# Patient Record
Sex: Male | Born: 1950 | Race: Black or African American | Hispanic: No | Marital: Single | Smoking: Never smoker
Health system: Southern US, Community
[De-identification: ages and names within clinical notes are randomized; demographics above are authoritative.]

## PROBLEM LIST (undated history)

## (undated) DIAGNOSIS — K219 Gastro-esophageal reflux disease without esophagitis: Secondary | ICD-10-CM

## (undated) DIAGNOSIS — K5900 Constipation, unspecified: Secondary | ICD-10-CM

## (undated) DIAGNOSIS — Z789 Other specified health status: Secondary | ICD-10-CM

## (undated) DIAGNOSIS — L309 Dermatitis, unspecified: Secondary | ICD-10-CM

## (undated) DIAGNOSIS — E785 Hyperlipidemia, unspecified: Secondary | ICD-10-CM

## (undated) DIAGNOSIS — J069 Acute upper respiratory infection, unspecified: Secondary | ICD-10-CM

## (undated) DIAGNOSIS — M1990 Unspecified osteoarthritis, unspecified site: Secondary | ICD-10-CM

## (undated) DIAGNOSIS — T7840XA Allergy, unspecified, initial encounter: Secondary | ICD-10-CM

## (undated) DIAGNOSIS — M25559 Pain in unspecified hip: Secondary | ICD-10-CM

## (undated) DIAGNOSIS — E119 Type 2 diabetes mellitus without complications: Secondary | ICD-10-CM

## (undated) DIAGNOSIS — J449 Chronic obstructive pulmonary disease, unspecified: Secondary | ICD-10-CM

## (undated) DIAGNOSIS — J45998 Other asthma: Secondary | ICD-10-CM

## (undated) DIAGNOSIS — I10 Essential (primary) hypertension: Secondary | ICD-10-CM

## (undated) DIAGNOSIS — K08109 Complete loss of teeth, unspecified cause, unspecified class: Secondary | ICD-10-CM

## (undated) HISTORY — DX: Essential (primary) hypertension: I10

## (undated) HISTORY — DX: Acute upper respiratory infection, unspecified: J06.9

## (undated) HISTORY — DX: Hyperlipidemia, unspecified: E78.5

## (undated) HISTORY — PX: CIRCUMCISION: SUR203

## (undated) HISTORY — DX: Type 2 diabetes mellitus without complications: E11.9

## (undated) HISTORY — DX: Dermatitis, unspecified: L30.9

## (undated) HISTORY — DX: Constipation, unspecified: K59.00

## (undated) HISTORY — DX: Allergy, unspecified, initial encounter: T78.40XA

## (undated) HISTORY — DX: Unspecified osteoarthritis, unspecified site: M19.90

## (undated) HISTORY — DX: Gastro-esophageal reflux disease without esophagitis: K21.9

## (undated) HISTORY — DX: Chronic obstructive pulmonary disease, unspecified: J44.9

---

## 2013-02-02 ENCOUNTER — Encounter (HOSPITAL_COMMUNITY): Payer: Self-pay | Admitting: Pharmacy Technician

## 2013-02-04 ENCOUNTER — Encounter (HOSPITAL_COMMUNITY)
Admission: RE | Admit: 2013-02-04 | Discharge: 2013-02-04 | Disposition: A | Discharge status: Home / Self Care | Payer: BC Managed Care – PPO | Source: Ambulatory Visit | Attending: Ophthalmology | Admitting: Ophthalmology

## 2013-02-04 ENCOUNTER — Encounter (HOSPITAL_COMMUNITY): Payer: Self-pay

## 2013-02-04 DIAGNOSIS — Z01818 Encounter for other preprocedural examination: Secondary | ICD-10-CM | POA: Insufficient documentation

## 2013-02-04 DIAGNOSIS — Z01812 Encounter for preprocedural laboratory examination: Secondary | ICD-10-CM | POA: Insufficient documentation

## 2013-02-04 DIAGNOSIS — Z01810 Encounter for preprocedural cardiovascular examination: Secondary | ICD-10-CM | POA: Insufficient documentation

## 2013-02-04 HISTORY — DX: Pain in unspecified hip: M25.559

## 2013-02-04 HISTORY — DX: Other asthma: J45.998

## 2013-02-04 HISTORY — DX: Other specified health status: Z78.9

## 2013-02-04 HISTORY — DX: Complete loss of teeth, unspecified cause, unspecified class: K08.109

## 2013-02-04 LAB — BASIC METABOLIC PANEL
CO2: 29 mEq/L (ref 19–32)
Chloride: 106 mEq/L (ref 96–112)
Glucose, Bld: 164 mg/dL — ABNORMAL HIGH (ref 70–99)
Potassium: 4 mEq/L (ref 3.5–5.1)
Sodium: 141 mEq/L (ref 135–145)

## 2013-02-04 LAB — HEMOGLOBIN AND HEMATOCRIT, BLOOD
HCT: 42 % (ref 39.0–52.0)
Hemoglobin: 14.2 g/dL (ref 13.0–17.0)

## 2013-02-04 NOTE — Patient Instructions (Addendum)
Your procedure is scheduled on:  Thursday, 02/17/13   Report to [facility] at    0730    AM.  Call this number if you have problems the morning of surgery: [phone]   Remember:   Do not eat or drink   After Midnight.  Take these medicines the morning of surgery with A SIP OF WATER:    Do not wear jewelry, make-up or nail polish.  Do not wear lotions, powders, or perfumes. You may wear deodorant.  Do not bring valuables to the hospital.  Contacts, dentures or bridgework may not be worn into surgery.     Patients discharged the day of surgery will not be allowed to drive home.  Name and phone number of your driver: driver  Special Instructions: Use eye drops as directed.   Please read over the following fact sheets that you were given: Pain Booklet, Anesthesia Post-op Instructions and Care and Recovery After Surgery    Cataract Surgery  A cataract is a clouding of the lens of the eye. When a lens becomes cloudy, vision is reduced based on the degree and nature of the clouding. Surgery may be needed to improve vision. Surgery removes the cloudy lens and usually replaces it with a substitute lens (intraocular lens, IOL). LET YOUR EYE DOCTOR KNOW ABOUT:  Allergies to food or medicine.   Medicines taken including herbs, eyedrops, over-the-counter medicines, and creams.   Use of steroids (by mouth or creams).   Previous problems with anesthetics or numbing medicine.   History of bleeding problems or blood clots.   Previous surgery.   Other health problems, including diabetes and kidney problems.   Possibility of pregnancy, if this applies.  RISKS AND COMPLICATIONS  Infection.   Inflammation of the eyeball (endophthalmitis) that can spread to both eyes (sympathetic ophthalmia).   Poor wound healing.   If an IOL is inserted, it can later fall out of proper position. This is very uncommon.   Clouding of the part of your eye that holds an IOL in place. This is called an  "after-cataract." These are uncommon, but easily treated.  BEFORE THE PROCEDURE  Do not eat or drink anything except small amounts of water for 8 to 12 before your surgery, or as directed by your caregiver.   Unless you are told otherwise, continue any eyedrops you have been prescribed.   Talk to your primary caregiver about all other medicines that you take (both prescription and non-prescription). In some cases, you may need to stop or change medicines near the time of your surgery. This is most important if you are taking blood-thinning medicine.Do not stop medicines unless you are told to do so.   Arrange for someone to drive you to and from the procedure.   Do not put contact lenses in either eye on the day of your surgery.  PROCEDURE There is more than one method for safely removing a cataract. Your doctor can explain the differences and help determine which is best for you. Phacoemulsification surgery is the most common form of cataract surgery.  An injection is given behind the eye or eyedrops are given to make this a painless procedure.   A small cut (incision) is made on the edge of the clear, dome-shaped surface that covers the front of the eye (cornea).   A tiny probe is painlessly inserted into the eye. This device gives off ultrasound waves that soften and break up the cloudy center of the lens. This makes  it easier for the cloudy lens to be removed by suction.   An IOL may be implanted.   The normal lens of the eye is covered by a clear capsule. Part of that capsule is intentionally left in the eye to support the IOL.   Your surgeon may or may not use stitches to close the incision.  There are other forms of cataract surgery that require a larger incision and stiches to close the eye. This approach is taken in cases where the doctor feels that the cataract cannot be easily removed using phacoemulsification. AFTER THE PROCEDURE  When an IOL is implanted, it does not need  care. It becomes a permanent part of your eye and cannot be seen or felt.   Your doctor will schedule follow-up exams to check on your progress.   Review your other medicines with your doctor to see which can be resumed after surgery.   Use eyedrops or take medicine as prescribed by your doctor.  Document Released: 04/24/2011 Document Reviewed: 04/21/2011 [facility] Patient Information 2012 [place], [name].  PATIENT INSTRUCTIONS POST-ANESTHESIA  IMMEDIATELY FOLLOWING SURGERY:  Do not drive or operate machinery for the first twenty four hours after surgery.  Do not make any important decisions for twenty four hours after surgery or while taking narcotic pain medications or sedatives.  If you develop intractable nausea and vomiting or a severe headache please notify your doctor immediately.  FOLLOW-UP:  Please make an appointment with your surgeon as instructed. You do not need to follow up with anesthesia unless specifically instructed to do so.  WOUND CARE INSTRUCTIONS (if applicable):  Keep a dry clean dressing on the anesthesia/puncture wound site if there is drainage.  Once the wound has quit draining you may leave it open to air.  Generally you should leave the bandage intact for twenty four hours unless there is drainage.  If the epidural site drains for more than 36-48 hours please call the anesthesia department.  QUESTIONS?:  Please feel free to call your physician or the hospital operator if you have any questions, and they will be happy to assist you.

## 2013-02-16 MED ORDER — LIDOCAINE HCL (PF) 1 % IJ SOLN
INTRAMUSCULAR | Status: AC
  Filled 2013-02-16: qty 2

## 2013-02-16 MED ORDER — LIDOCAINE HCL 3.5 % OP GEL
OPHTHALMIC | Status: AC
  Filled 2013-02-16: qty 1

## 2013-02-16 MED ORDER — NEOMYCIN-POLYMYXIN-DEXAMETH 3.5-10000-0.1 OP SUSP
OPHTHALMIC | Status: AC
  Filled 2013-02-16: qty 5

## 2013-02-16 MED ORDER — CYCLOPENTOLATE-PHENYLEPHRINE OP SOLN OPTIME - NO CHARGE
OPHTHALMIC | Status: AC
  Filled 2013-02-16: qty 2

## 2013-02-16 MED ORDER — PHENYLEPHRINE HCL 2.5 % OP SOLN
OPHTHALMIC | Status: AC
  Filled 2013-02-16: qty 15

## 2013-02-16 MED ORDER — TETRACAINE HCL 0.5 % OP SOLN
OPHTHALMIC | Status: AC
  Filled 2013-02-16: qty 2

## 2013-02-17 ENCOUNTER — Ambulatory Visit (HOSPITAL_COMMUNITY)
Admission: RE | Admit: 2013-02-17 | Discharge: 2013-02-17 | Disposition: A | Discharge status: Home / Self Care | Payer: BC Managed Care – PPO | Source: Ambulatory Visit | Attending: Ophthalmology | Admitting: Ophthalmology

## 2013-02-17 ENCOUNTER — Encounter (HOSPITAL_COMMUNITY)
Admission: RE | Disposition: A | Discharge status: Home / Self Care | Payer: Self-pay | Source: Ambulatory Visit | Attending: Ophthalmology

## 2013-02-17 ENCOUNTER — Ambulatory Visit (HOSPITAL_COMMUNITY): Payer: BC Managed Care – PPO | Admitting: Anesthesiology

## 2013-02-17 ENCOUNTER — Encounter (HOSPITAL_COMMUNITY): Payer: Self-pay | Admitting: *Deleted

## 2013-02-17 ENCOUNTER — Encounter (HOSPITAL_COMMUNITY): Payer: Self-pay | Admitting: Anesthesiology

## 2013-02-17 DIAGNOSIS — Z01812 Encounter for preprocedural laboratory examination: Secondary | ICD-10-CM | POA: Insufficient documentation

## 2013-02-17 DIAGNOSIS — R7309 Other abnormal glucose: Secondary | ICD-10-CM | POA: Insufficient documentation

## 2013-02-17 DIAGNOSIS — H2510 Age-related nuclear cataract, unspecified eye: Secondary | ICD-10-CM | POA: Insufficient documentation

## 2013-02-17 HISTORY — PX: CATARACT EXTRACTION W/PHACO: SHX586

## 2013-02-17 LAB — GLUCOSE, CAPILLARY: Glucose-Capillary: 101 mg/dL — ABNORMAL HIGH (ref 70–99)

## 2013-02-17 SURGERY — PHACOEMULSIFICATION, CATARACT, WITH IOL INSERTION
Anesthesia: Monitor Anesthesia Care | Site: Eye | Laterality: Right | Wound class: Clean

## 2013-02-17 MED ORDER — NEOMYCIN-POLYMYXIN-DEXAMETH 0.1 % OP OINT
TOPICAL_OINTMENT | OPHTHALMIC | Status: DC | PRN
  Administered 2013-02-17: 1 via OPHTHALMIC

## 2013-02-17 MED ORDER — EPINEPHRINE HCL 1 MG/ML IJ SOLN
INTRAOCULAR | Status: DC | PRN
  Administered 2013-02-17: 09:00:00

## 2013-02-17 MED ORDER — CYCLOPENTOLATE-PHENYLEPHRINE 0.2-1 % OP SOLN
1.0000 [drp] | OPHTHALMIC | Status: AC
  Administered 2013-02-17 (×3): 1 [drp] via OPHTHALMIC

## 2013-02-17 MED ORDER — POVIDONE-IODINE 5 % OP SOLN
OPHTHALMIC | Status: DC | PRN
  Administered 2013-02-17: 1 via OPHTHALMIC

## 2013-02-17 MED ORDER — MIDAZOLAM HCL 2 MG/2ML IJ SOLN
INTRAMUSCULAR | Status: AC
  Filled 2013-02-17: qty 2

## 2013-02-17 MED ORDER — PHENYLEPHRINE HCL 2.5 % OP SOLN
1.0000 [drp] | OPHTHALMIC | Status: AC
  Administered 2013-02-17 (×3): 1 [drp] via OPHTHALMIC

## 2013-02-17 MED ORDER — LIDOCAINE HCL (PF) 1 % IJ SOLN
INTRAMUSCULAR | Status: DC | PRN
  Administered 2013-02-17: .2 mL

## 2013-02-17 MED ORDER — BSS IO SOLN
INTRAOCULAR | Status: DC | PRN
  Administered 2013-02-17: 15 mL via INTRAOCULAR

## 2013-02-17 MED ORDER — LIDOCAINE 3.5 % OP GEL OPTIME - NO CHARGE
OPHTHALMIC | Status: DC | PRN
  Administered 2013-02-17: 1 [drp] via OPHTHALMIC

## 2013-02-17 MED ORDER — TETRACAINE HCL 0.5 % OP SOLN
1.0000 [drp] | OPHTHALMIC | Status: AC
  Administered 2013-02-17 (×3): 1 [drp] via OPHTHALMIC

## 2013-02-17 MED ORDER — LIDOCAINE HCL 3.5 % OP GEL
1.0000 "application " | Freq: Once | OPHTHALMIC | Status: AC
  Administered 2013-02-17: 1 via OPHTHALMIC

## 2013-02-17 MED ORDER — LACTATED RINGERS IV SOLN
INTRAVENOUS | Status: DC
  Administered 2013-02-17: 1000 mL via INTRAVENOUS

## 2013-02-17 MED ORDER — PROVISC 10 MG/ML IO SOLN
INTRAOCULAR | Status: DC | PRN
  Administered 2013-02-17: 8.5 mg via INTRAOCULAR

## 2013-02-17 MED ORDER — EPINEPHRINE HCL 1 MG/ML IJ SOLN
INTRAMUSCULAR | Status: AC
  Filled 2013-02-17: qty 1

## 2013-02-17 MED ORDER — MIDAZOLAM HCL 2 MG/2ML IJ SOLN
1.0000 mg | INTRAMUSCULAR | Status: DC | PRN
  Administered 2013-02-17: 2 mg via INTRAVENOUS

## 2013-02-17 SURGICAL SUPPLY — 32 items
CAPSULAR TENSION RING-AMO (OPHTHALMIC RELATED) IMPLANT
CLOTH BEACON ORANGE TIMEOUT ST (SAFETY) ×2 IMPLANT
EYE SHIELD UNIVERSAL CLEAR (GAUZE/BANDAGES/DRESSINGS) ×2 IMPLANT
GLOVE BIO SURGEON STRL SZ 6.5 (GLOVE) IMPLANT
GLOVE BIOGEL PI IND STRL 6.5 (GLOVE) ×1 IMPLANT
GLOVE BIOGEL PI IND STRL 7.0 (GLOVE) IMPLANT
GLOVE BIOGEL PI IND STRL 7.5 (GLOVE) IMPLANT
GLOVE BIOGEL PI INDICATOR 6.5 (GLOVE) ×1
GLOVE BIOGEL PI INDICATOR 7.0 (GLOVE)
GLOVE BIOGEL PI INDICATOR 7.5 (GLOVE)
GLOVE ECLIPSE 6.5 STRL STRAW (GLOVE) IMPLANT
GLOVE ECLIPSE 7.0 STRL STRAW (GLOVE) IMPLANT
GLOVE ECLIPSE 7.5 STRL STRAW (GLOVE) IMPLANT
GLOVE EXAM NITRILE LRG STRL (GLOVE) ×2 IMPLANT
GLOVE EXAM NITRILE MD LF STRL (GLOVE) IMPLANT
GLOVE SKINSENSE NS SZ6.5 (GLOVE)
GLOVE SKINSENSE NS SZ7.0 (GLOVE)
GLOVE SKINSENSE STRL SZ6.5 (GLOVE) IMPLANT
GLOVE SKINSENSE STRL SZ7.0 (GLOVE) IMPLANT
KIT VITRECTOMY (OPHTHALMIC RELATED) IMPLANT
PAD ARMBOARD 7.5X6 YLW CONV (MISCELLANEOUS) ×2 IMPLANT
PROC W NO LENS (INTRAOCULAR LENS)
PROC W SPEC LENS (INTRAOCULAR LENS)
PROCESS W NO LENS (INTRAOCULAR LENS) IMPLANT
PROCESS W SPEC LENS (INTRAOCULAR LENS) IMPLANT
RING MALYGIN (MISCELLANEOUS) IMPLANT
SIGHTPATH CAT PROC W REG LENS (Ophthalmic Related) ×2 IMPLANT
SYR TB 1ML LL NO SAFETY (SYRINGE) ×2 IMPLANT
TAPE SURG TRANSPORE 1 IN (GAUZE/BANDAGES/DRESSINGS) ×1 IMPLANT
TAPE SURGICAL TRANSPORE 1 IN (GAUZE/BANDAGES/DRESSINGS) ×1
VISCOELASTIC ADDITIONAL (OPHTHALMIC RELATED) IMPLANT
WATER STERILE IRR 250ML POUR (IV SOLUTION) ×2 IMPLANT

## 2013-02-17 NOTE — H&P (Signed)
I have reviewed the H&P, the patient was re-examined, and I have identified no interval changes in medical condition and plan of care since the history and physical of record  

## 2013-02-17 NOTE — Transfer of Care (Signed)
Immediate Anesthesia Transfer of Care Note  Patient: Nicholas Burns  Procedure(s) Performed: Procedure(s) with comments: CATARACT EXTRACTION PHACO AND INTRAOCULAR LENS PLACEMENT (IOC) (Right) - CDE:11.32  Patient Location: PACU and Short Stay  Anesthesia Type:MAC  Level of Consciousness: awake  Airway & Oxygen Therapy: Patient Spontanous Breathing  Post-op Assessment: Report given to PACU RN  Post vital signs: Reviewed  Complications: No apparent anesthesia complications

## 2013-02-17 NOTE — Anesthesia Postprocedure Evaluation (Signed)
  Anesthesia Post-op Note  Patient: Nicholas Burns  Procedure(s) Performed: Procedure(s) with comments: CATARACT EXTRACTION PHACO AND INTRAOCULAR LENS PLACEMENT (IOC) (Right) - CDE:11.32  Patient Location: Short Stay  Anesthesia Type:MAC  Level of Consciousness: awake, alert  and oriented  Airway and Oxygen Therapy: Patient Spontanous Breathing  Post-op Pain: none  Post-op Assessment: Post-op Vital signs reviewed, Patient's Cardiovascular Status Stable, Respiratory Function Stable, Patent Airway and No signs of Nausea or vomiting  Post-op Vital Signs: Reviewed and stable  Complications: No apparent anesthesia complications

## 2013-02-17 NOTE — Anesthesia Preprocedure Evaluation (Signed)
Anesthesia Evaluation  Patient identified by MRN, date of birth, ID band Patient awake  General Assessment Comment:Poor historian   Reviewed: Allergy & Precautions, H&P , NPO status , Patient's Chart, lab work & pertinent test results, reviewed documented beta blocker date and time   Airway Mallampati: III TM Distance: >3 FB   Mouth opening: Limited Mouth Opening  Dental  (+) Poor Dentition and Missing   Pulmonary asthma ,  breath sounds clear to auscultation        Cardiovascular negative cardio ROS  Rhythm:Regular Rate:Normal     Neuro/Psych    GI/Hepatic negative GI ROS,   Endo/Other    Renal/GU      Musculoskeletal   Abdominal   Peds  Hematology   Anesthesia Other Findings   Reproductive/Obstetrics                           Anesthesia Physical Anesthesia Plan  ASA: II  Anesthesia Plan: MAC   Post-op Pain Management:    Induction: Intravenous  Airway Management Planned: Nasal Cannula  Additional Equipment:   Intra-op Plan:   Post-operative Plan:   Informed Consent: I have reviewed the patients History and Physical, chart, labs and discussed the procedure including the risks, benefits and alternatives for the proposed anesthesia with the patient or authorized representative who has indicated his/her understanding and acceptance.     Plan Discussed with:   Anesthesia Plan Comments:         Anesthesia Quick Evaluation

## 2013-02-17 NOTE — Op Note (Signed)
Date of Admission: 02/17/2013  Date of Surgery: 02/17/2013  Pre-Op Dx: Cataract  Right  Eye  Post-Op Dx: Nuclear Cataract  Right  Eye,  Dx Code 366.16  Surgeon: Gemma Payor, M.D.  Assistants: None  Anesthesia: Topical with MAC  Indications: Painless, progressive loss of vision with compromise of daily activities.  Surgery: Cataract Extraction with Intraocular lens Implant Right Eye  Discription: The patient had dilating drops and viscous lidocaine placed into the right eye in the pre-op holding area. After transfer to the operating room, a time out was performed. The patient was then prepped and draped. Beginning with a 75 degree blade a paracentesis port was made at the surgeon's 2 o'clock position. The anterior chamber was then filled with 1% non-preserved lidocaine. This was followed by filling the anterior chamber with Provisc.  A 2.15mm keratome blade was used to make a clear corneal incision at the temporal limbus.  A bent cystatome needle was used to create a continuous tear capsulotomy. Hydrodissection was performed with balanced salt solution on a Fine canula. The lens nucleus was then removed using the phacoemulsification handpiece. Residual cortex was removed with the I&A handpiece. The anterior chamber and capsular bag were refilled with Provisc. A posterior chamber intraocular lens was placed into the capsular bag with it's injector. The implant was positioned with the Kuglan hook. The Provisc was then removed from the anterior chamber and capsular bag with the I&A handpiece. Stromal hydration of the main incision and paracentesis port was performed with BSS on a Fine canula. The wounds were tested for leak which was negative. The patient tolerated the procedure well. There were no operative complications. The patient was then transferred to the recovery room in stable condition.  Complications: None  Specimen: None  EBL: None  Prosthetic device: B&L enVista, MX60, power 19.0D, SN  [id number].

## 2013-02-18 ENCOUNTER — Encounter (HOSPITAL_COMMUNITY): Payer: Self-pay | Admitting: Ophthalmology

## 2013-03-26 ENCOUNTER — Emergency Department (HOSPITAL_COMMUNITY)
Admission: EM | Admit: 2013-03-26 | Discharge: 2013-03-26 | Disposition: A | Discharge status: Home / Self Care | Payer: BC Managed Care – PPO | Attending: Emergency Medicine | Admitting: Emergency Medicine

## 2013-03-26 ENCOUNTER — Encounter (HOSPITAL_COMMUNITY): Payer: Self-pay | Admitting: Emergency Medicine

## 2013-03-26 DIAGNOSIS — H409 Unspecified glaucoma: Secondary | ICD-10-CM

## 2013-03-26 DIAGNOSIS — J45909 Unspecified asthma, uncomplicated: Secondary | ICD-10-CM | POA: Insufficient documentation

## 2013-03-26 MED ORDER — TIMOLOL MALEATE 0.5 % OP SOLN
1.0000 [drp] | Freq: Once | OPHTHALMIC | Status: AC
  Administered 2013-03-26: 1 [drp] via OPHTHALMIC
  Filled 2013-03-26: qty 5

## 2013-03-26 MED ORDER — PILOCARPINE HCL 2 % OP SOLN: 1.0000 [drp] | Freq: Four times a day (QID) | OPHTHALMIC | Status: DC

## 2013-03-26 MED ORDER — PILOCARPINE HCL 2 % OP SOLN
1.0000 [drp] | Freq: Once | OPHTHALMIC | Status: AC
  Administered 2013-03-26: 1 [drp] via OPHTHALMIC
  Filled 2013-03-26: qty 15

## 2013-03-26 MED ORDER — PREDNISOLONE ACETATE 1 % OP SUSP: 1.0000 [drp] | Freq: Four times a day (QID) | OPHTHALMIC | Status: DC

## 2013-03-26 MED ORDER — ACETAZOLAMIDE 250 MG PO TABS
500.0000 mg | ORAL_TABLET | Freq: Once | ORAL | Status: AC
  Administered 2013-03-26: 500 mg via ORAL
  Filled 2013-03-26: qty 2

## 2013-03-26 MED ORDER — TIMOLOL MALEATE 0.5 % OP SOLN: 1.0000 [drp] | Freq: Two times a day (BID) | OPHTHALMIC | Status: DC

## 2013-03-26 MED ORDER — TETRACAINE HCL 0.5 % OP SOLN
2.0000 [drp] | Freq: Once | OPHTHALMIC | Status: AC
  Administered 2013-03-26: 2 [drp] via OPHTHALMIC
  Filled 2013-03-26: qty 2

## 2013-03-26 MED ORDER — OXYCODONE-ACETAMINOPHEN 5-325 MG PO TABS
1.0000 | ORAL_TABLET | Freq: Once | ORAL | Status: AC
  Administered 2013-03-26: 1 via ORAL
  Filled 2013-03-26: qty 1

## 2013-03-26 MED ORDER — APRACLONIDINE HCL 1 % OP SOLN
1.0000 [drp] | Freq: Once | OPHTHALMIC | Status: AC
  Administered 2013-03-26: 1 [drp] via OPHTHALMIC
  Filled 2013-03-26: qty 0.1

## 2013-03-26 MED ORDER — ACETAZOLAMIDE 250 MG PO TABS: 250.0000 mg | ORAL_TABLET | Freq: Three times a day (TID) | ORAL | Status: DC

## 2013-03-26 NOTE — ED Notes (Signed)
At work at [employer]; started having sensitivity to light and headache suddenly with right eye pain and watering and wouldn't close. 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 No history of htn or use of meds for htn. Has had cough/cold symptoms since yesterday. Currently reports no headache now, just light sensitivity. Has history of cataract surgery.

## 2013-03-26 NOTE — ED Provider Notes (Signed)
CSN: [id number]     Arrival date & time 03/26/13  1557 History   First MD Initiated Contact with Patient 03/26/13 1559     Chief Complaint  Patient presents with  . Hypertension   (Consider location/radiation/quality/duration/timing/severity/associated sxs/prior Treatment) HPI  This is a 62 year old male who presents with acute onset of right eye pain, headache, and photosensitivity. Patient was at work when he describes having pretty rapid onset of right eye pain. He develops photosensitivity and his eyes began to water. Patient also reports a headache.  He has no history of glaucoma but did have a recent cataract surgery.  Patient was noted to have high blood pressure when it was checked at work. EMS was called. Patient denies any worst HA of his life or maximal headache at onset.    Past Medical History  Diagnosis Date  . Seasonal asthma   . Poor historian   . Hip pain, acute     left  . Teeth missing    Past Surgical History  Procedure Laterality Date  . Circumcision  age 39    [facility]  . Cataract extraction w/phaco Right 02/17/2013    Procedure: CATARACT EXTRACTION PHACO AND INTRAOCULAR LENS PLACEMENT (IOC);  Surgeon: Gemma Payor, MD;  Location: [facility];  Service: Ophthalmology;  Laterality: Right;  CDE:11.32   History reviewed. No pertinent family history. History  Substance Use Topics  . Smoking status: Never Smoker   . Smokeless tobacco: Not on file  . Alcohol Use: No    Review of Systems  Constitutional: Negative for fever.  Eyes: Positive for photophobia, pain, redness and visual disturbance.  Respiratory: Negative for chest tightness and shortness of breath.   Neurological: Positive for headaches. Negative for weakness.  All other systems reviewed and are negative.    Allergies  Review of patient's allergies indicates no known allergies.  Home Medications   Current Outpatient Rx  Name  Route  Sig  Dispense  Refill  . acetaZOLAMIDE (DIAMOX) 250 MG tablet    Oral   Take 1 tablet (250 mg total) by mouth 3 (three) times daily.   12 tablet   0   . pilocarpine (PILOCAR) 2 % ophthalmic solution   Right Eye   Place 1 drop into the right eye 4 (four) times daily.   15 mL   12   . prednisoLONE acetate (PRED FORTE) 1 % ophthalmic suspension   Right Eye   Place 1 drop into the right eye 4 (four) times daily.   5 mL   0   . timolol (TIMOPTIC) 0.5 % ophthalmic solution   Right Eye   Place 1 drop into the right eye 2 (two) times daily.   10 mL   12    BP 150/96  Pulse 73  Temp(Src) 100 F (37.8 C) (Oral)  Resp 19  Ht 5\' 9"  (1.753 m)  Wt 230 lb (104.327 kg)  BMI 33.95 kg/m2  SpO2 97% Physical Exam  Nursing note and vitals reviewed. Constitutional: He is oriented to person, place, and time. He appears well-developed and well-nourished.  Uncomfortable appearing, eyes closed  HENT:  Head: Normocephalic and atraumatic.  Mouth/Throat: Oropharynx is clear and moist.  Eyes:  Right eye: Chemosis, pupil 4 mm and minimally reactive, extraocular movements intact left eye: Pupil 5 mm and reactive, no chemosis  Neck: Neck supple.  Cardiovascular: Normal rate, regular rhythm and normal heart sounds.   No murmur heard. Pulmonary/Chest: Effort normal and breath sounds normal. No respiratory distress.  Abdominal: Soft. There is no tenderness.  Musculoskeletal: He exhibits no edema.  Neurological: He is alert and oriented to person, place, and time.  Skin: Skin is warm and dry.  Psychiatric: He has a normal mood and affect.    ED Course  Procedures (including critical care time) Labs Review Labs Reviewed - No data to display Imaging Review No results found.  EKG Interpretation   None       MDM   1. Glaucoma    Patient presentation and PE concerning for acute glaucoma.  Pressure 21 which is just upper limits of normal to slightly elevated.  However, exam is very suggestive.  Will treat.    Repeat pressure after treatment is 6.   Patient initially continued to complain of eye pain and sensitivity.  Fluoroscein exam does not show any evidence of corneal abnormality or abrasion.   Discussed patient with MD on call for Dr. Alto Denver.  They agree with treatment and follow-up on Monday.    Reexam after treatment:  OP of 6, pupil more reactive, no evidence of chemosis.  VIsion 20/30.  Pain improved.  Patient will be discharged home with medications and close Optho follow-up.  GIven strict return precautions.  After history, exam, and medical workup I feel the patient has been appropriately medically screened and is safe for discharge home. Pertinent diagnoses were discussed with the patient. Patient was given return precautions.      Shon Baton, MD 03/26/13 2103

## 2013-03-26 NOTE — ED Notes (Signed)
Dr. Wilkie Aye at bedside for assessment

## 2013-03-26 NOTE — ED Notes (Signed)
Pt comfortable with d/c and f/u instructions. Prescriptions x4

## 2013-03-26 NOTE — ED Notes (Signed)
Tonopen at bedside.  

## 2013-09-30 ENCOUNTER — Ambulatory Visit (INDEPENDENT_AMBULATORY_CARE_PROVIDER_SITE_OTHER): Payer: BC Managed Care – PPO | Admitting: Physician Assistant

## 2013-09-30 ENCOUNTER — Encounter: Payer: Self-pay | Admitting: Physician Assistant

## 2013-09-30 VITALS — BP 146/92 | HR 51 | Temp 97.7°F | Ht 66.5 in | Wt 231.0 lb

## 2013-09-30 DIAGNOSIS — Z0000 Encounter for general adult medical examination without abnormal findings: Secondary | ICD-10-CM

## 2013-09-30 DIAGNOSIS — J45909 Unspecified asthma, uncomplicated: Secondary | ICD-10-CM

## 2013-09-30 DIAGNOSIS — K089 Disorder of teeth and supporting structures, unspecified: Secondary | ICD-10-CM

## 2013-09-30 MED ORDER — ALBUTEROL SULFATE (2.5 MG/3ML) 0.083% IN NEBU: 2.5000 mg | INHALATION_SOLUTION | Freq: Four times a day (QID) | RESPIRATORY_TRACT | Status: DC | PRN

## 2013-09-30 NOTE — Progress Notes (Signed)
Subjective:     Patient ID: Nicholas Burns, male   DOB: April 04, 1951, 63 y.o.   MRN: [id number]  HPI Pt here for CPE He was told by his place of work he needed one for ins purposes Pt only with  hx of intermit LBP and seasonal asthma He uses OTC Primatene Mist for sx  Review of Systems  Constitutional: Negative.   HENT: Positive for dental problem.   Eyes: Negative.   Respiratory: Positive for wheezing.   Cardiovascular: Negative.   Gastrointestinal: Negative.   Endocrine: Negative.   Genitourinary:       Nocturia x 2  Musculoskeletal: Positive for back pain.  Skin: Negative.   Allergic/Immunologic: Positive for environmental allergies.  Neurological: Negative.   Hematological: Negative.   Psychiatric/Behavioral: Negative.        Objective:   Physical Exam  Constitutional: He is oriented to person, place, and time. He appears well-developed and well-nourished.  HENT:  Head: Normocephalic and atraumatic.  Right Ear: External ear normal.  Left Ear: External ear normal.  Nose: Nose normal.  Mouth/Throat: Oropharynx is clear and moist.  Multiple missing teeth but no current caries seen  Eyes: Conjunctivae and EOM are normal. Pupils are equal, round, and reactive to light.  Neck: Normal range of motion. Neck supple. No JVD present. No thyromegaly present.  Cardiovascular: Normal rate, regular rhythm, normal heart sounds and intact distal pulses.   Pulmonary/Chest: Effort normal. He has wheezes.  Abdominal: Soft. Bowel sounds are normal. He exhibits no distension and no mass. There is no tenderness.  Genitourinary: Penis normal.  Musculoskeletal: Normal range of motion. He exhibits no edema and no tenderness.  Lymphadenopathy:    He has no cervical adenopathy.  Neurological: He is alert and oriented to person, place, and time. He has normal reflexes. No cranial nerve deficit.  Skin: Skin is warm and dry.  Psychiatric: He has a normal mood and affect. His behavior is normal.        Assessment:     CPE    Plan:     Pt keeps regular appt with dentist and has one in the near future Pt up to date with eye exam Discussed BP was elevated today but improved after the exam I would like him to f/u and have regular BP check with the nurse at [facility] Alb rx done today and pt to stop use of Primatene Full labs pending and will inform of the lab results Begin walking program and decrease soda intake Pt to f/u pending labs Pt also to sign med release form so we can get notes form prev provider

## 2013-09-30 NOTE — Patient Instructions (Signed)

## 2013-10-01 ENCOUNTER — Ambulatory Visit: Payer: BC Managed Care – PPO

## 2013-10-01 ENCOUNTER — Other Ambulatory Visit: Payer: Self-pay | Admitting: Nurse Practitioner

## 2013-10-01 DIAGNOSIS — J45909 Unspecified asthma, uncomplicated: Secondary | ICD-10-CM

## 2013-10-01 LAB — CMP14+EGFR
A/G RATIO: 1.4 (ref 1.1–2.5)
ALK PHOS: 60 IU/L (ref 39–117)
ALT: 22 IU/L (ref 0–44)
AST: 26 IU/L (ref 0–40)
Albumin: 4.4 g/dL (ref 3.6–4.8)
BILIRUBIN TOTAL: 0.6 mg/dL (ref 0.0–1.2)
BUN / CREAT RATIO: 12 (ref 10–22)
BUN: 13 mg/dL (ref 8–27)
CHLORIDE: 100 mmol/L (ref 97–108)
CO2: 25 mmol/L (ref 18–29)
Calcium: 9.4 mg/dL (ref 8.6–10.2)
Creatinine, Ser: 1.09 mg/dL (ref 0.76–1.27)
GFR, EST AFRICAN AMERICAN: 84 mL/min/{1.73_m2} (ref >59)
GFR, EST NON AFRICAN AMERICAN: 72 mL/min/{1.73_m2} (ref >59)
Globulin, Total: 3.2 g/dL (ref 1.5–4.5)
Glucose: 104 mg/dL — ABNORMAL HIGH (ref 65–99)
POTASSIUM: 4.1 mmol/L (ref 3.5–5.2)
Sodium: 141 mmol/L (ref 134–144)
TOTAL PROTEIN: 7.6 g/dL (ref 6.0–8.5)

## 2013-10-01 LAB — LIPID PANEL
CHOLESTEROL TOTAL: 148 mg/dL (ref 100–199)
Chol/HDL Ratio: 3.6 ratio units (ref 0.0–5.0)
HDL: 41 mg/dL (ref >39)
LDL CALC: 95 mg/dL (ref 0–99)
Triglycerides: 59 mg/dL (ref 0–149)
VLDL CHOLESTEROL CAL: 12 mg/dL (ref 5–40)

## 2013-10-01 LAB — PSA, TOTAL AND FREE
PSA FREE PCT: 27 %
PSA FREE: 0.62 ng/mL
PSA: 2.3 ng/mL (ref 0.0–4.0)

## 2013-10-03 ENCOUNTER — Telehealth: Payer: Self-pay | Admitting: Family Medicine

## 2013-10-03 NOTE — Telephone Encounter (Signed)
Message copied by Waverly Ferrari on Mon Oct 03, 2013  4:23 PM ------      Message from: Lodema Pilot      Created: Mon Oct 03, 2013  2:58 PM       Labs better than 8 months ago      Sugar was sl elevated so re enforce minimize soda intake      Monitor BP      Keep regular f/u ------

## 2013-10-06 NOTE — Telephone Encounter (Signed)
Message copied by Cline Crock on Thu Oct 06, 2013 12:26 PM ------      Message from: Lodema Pilot      Created: Mon Oct 03, 2013  2:58 PM       Labs better than 8 months ago      Sugar was sl elevated so re enforce minimize soda intake      Monitor BP      Keep regular f/u ------

## 2013-10-15 ENCOUNTER — Encounter: Payer: Self-pay | Admitting: *Deleted

## 2013-11-02 ENCOUNTER — Encounter: Payer: Self-pay | Admitting: Physician Assistant

## 2013-11-02 ENCOUNTER — Ambulatory Visit (INDEPENDENT_AMBULATORY_CARE_PROVIDER_SITE_OTHER): Payer: BC Managed Care – PPO | Admitting: Physician Assistant

## 2013-11-02 ENCOUNTER — Telehealth: Payer: Self-pay | Admitting: *Deleted

## 2013-11-02 VITALS — BP 142/96 | HR 61 | Temp 98.2°F | Ht 66.5 in | Wt 234.2 lb

## 2013-11-02 DIAGNOSIS — J45909 Unspecified asthma, uncomplicated: Secondary | ICD-10-CM

## 2013-11-02 MED ORDER — FLUTICASONE PROPIONATE HFA 110 MCG/ACT IN AERO: 1.0000 | INHALATION_SPRAY | Freq: Two times a day (BID) | RESPIRATORY_TRACT | Status: DC

## 2013-11-02 NOTE — Progress Notes (Signed)
Subjective:     Patient ID: Nicholas Burns, male   DOB: 12-12-1950, 63 y.o.   MRN: [id number]  HPI Pt here for recheck of asthma He started the Alb inhaler and it has helped Using ~ 3 times per week  Review of Systems + wheeze Denis fever, chills, congestion + dry cough    Objective:   Physical Exam Oral- Clear PND No cerv nodes Heart- RRR w/o M Lungs- exp wheeze bilat    Assessment:    Asthma    Plan:     Due to continued wheeze will add Flovent Pt informed how to use and rinse mouth after use Continue with Alb for rescue F/U in 1 month

## 2013-11-02 NOTE — Patient Instructions (Signed)
Asthma Asthma is a recurring condition in which the airways tighten and narrow. Asthma can make it difficult to breathe. It can cause coughing, wheezing, and shortness of breath. Asthma episodes, also called asthma attacks, range from minor to life-threatening. Asthma cannot be cured, but medicines and lifestyle changes can help control it. CAUSES Asthma is believed to be caused by inherited (genetic) and environmental factors, but its exact cause is unknown. Asthma may be triggered by allergens, lung infections, or irritants in the air. Asthma triggers are different for each person. Common triggers include:   Animal dander.  Dust mites.  Cockroaches.  Pollen from trees or grass.  Mold.  Smoke.  Air pollutants such as dust, household cleaners, hair sprays, aerosol sprays, paint fumes, strong chemicals, or strong odors.  Cold air, weather changes, and winds (which increase molds and pollens in the air).  Strong emotional expressions such as crying or laughing hard.  Stress.  Certain medicines (such as aspirin) or types of drugs (such as beta-blockers).  Sulfites in foods and drinks. Foods and drinks that may contain sulfites include dried fruit, potato chips, and sparkling grape juice.  Infections or inflammatory conditions such as the flu, a cold, or an inflammation of the nasal membranes (rhinitis).  Gastroesophageal reflux disease (GERD).  Exercise or strenuous activity. SYMPTOMS Symptoms may occur immediately after asthma is triggered or many hours later. Symptoms include:  Wheezing.  Excessive nighttime or early morning coughing.  Frequent or severe coughing with a common cold.  Chest tightness.  Shortness of breath. DIAGNOSIS  The diagnosis of asthma is made by a review of your medical history and a physical exam. Tests may also be performed. These may include:  Lung function studies. These tests show how much air you breathe in and out.  Allergy  tests.  Imaging tests such as X-rays. TREATMENT  Asthma cannot be cured, but it can usually be controlled. Treatment involves identifying and avoiding your asthma triggers. It also involves medicines. There are 2 classes of medicine used for asthma treatment:   Controller medicines. These prevent asthma symptoms from occurring. They are usually taken every day.  Reliever or rescue medicines. These quickly relieve asthma symptoms. They are used as needed and provide short-term relief. Your health care Amair Shrout will help you create an asthma action plan. An asthma action plan is a written plan for managing and treating your asthma attacks. It includes a list of your asthma triggers and how they may be avoided. It also includes information on when medicines should be taken and when their dosage should be changed. An action plan may also involve the use of a device called a peak flow meter. A peak flow meter measures how well the lungs are working. It helps you monitor your condition. HOME CARE INSTRUCTIONS   Take medicine as directed by your health care Malan Werk. Speak with your health care Anely Spiewak if you have questions about how or when to take the medicines.  Use a peak flow meter as directed by your health care Maeson Purohit. Record and keep track of readings.  Understand and use the action plan to help minimize or stop an asthma attack without needing to seek medical care.  Control your home environment in the following ways to help prevent asthma attacks:  Do not smoke. Avoid being exposed to secondhand smoke.  Change your heating and air conditioning filter regularly.  Limit your use of fireplaces and wood stoves.  Get rid of pests (such as roaches and mice)  and their droppings.  Throw away plants if you see mold on them.  Clean your floors and dust regularly. Use unscented cleaning products.  Try to have someone else vacuum for you regularly. Stay out of rooms while they are being  vacuumed and for a short while afterward. If you vacuum, use a dust mask from a hardware store, a double-layered or microfilter vacuum cleaner bag, or a vacuum cleaner with a HEPA filter.  Replace carpet with wood, tile, or vinyl flooring. Carpet can trap dander and dust.  Use allergy-proof pillows, mattress covers, and box spring covers.  Wash bed sheets and blankets every week in hot water and dry them in a dryer.  Use blankets that are made of polyester or cotton.  Clean bathrooms and kitchens with bleach. If possible, have someone repaint the walls in these rooms with mold-resistant paint. Keep out of the rooms that are being cleaned and painted.  Wash hands frequently. SEEK MEDICAL CARE IF:   You have wheezing, shortness of breath, or a cough even if taking medicine to prevent attacks.  The colored mucus you cough up (sputum) is thicker than usual.  Your sputum changes from clear or white to yellow, green, gray, or bloody.  You have any problems that may be related to the medicines you are taking (such as a rash, itching, swelling, or trouble breathing).  You are using a reliever medicine more than 2-3 times per week.  Your peak flow is still at 50-79% of your personal best after following your action plan for 1 hour. SEEK IMMEDIATE MEDICAL CARE IF:   You seem to be getting worse and are unresponsive to treatment during an asthma attack.  You are short of breath even at rest.  You get short of breath when doing very little physical activity.  You have difficulty eating, drinking, or talking due to asthma symptoms.  You develop chest pain.  You develop a fast heartbeat.  You have a bluish color to your lips or fingernails.  You are lightheaded, dizzy, or faint.  Your peak flow is less than 50% of your personal best.  You have a fever or persistent symptoms for more than 2-3 days.  You have a fever and symptoms suddenly get worse. MAKE SURE YOU:   Understand  these instructions.  Will watch your condition.  Will get help right away if you are not doing well or get worse. Document Released: 05/05/2005 Document Revised: 05/10/2013 Document Reviewed: 12/02/2012 [facility] Patient Information 2015 [place], [place]. This information is not intended to replace advice given to you by your health care Jemario Poitras. Make sure you discuss any questions you have with your health care Blyss Lugar.

## 2013-11-02 NOTE — Telephone Encounter (Signed)
Patient's insurance won't cover Flovent so [facility] changed script to Pulmicort, use twice daily.

## 2015-07-20 ENCOUNTER — Ambulatory Visit (INDEPENDENT_AMBULATORY_CARE_PROVIDER_SITE_OTHER): Payer: BLUE CROSS/BLUE SHIELD | Admitting: Pediatrics

## 2015-07-20 VITALS — BP 175/104 | HR 75 | Temp 97.7°F | Wt 239.0 lb

## 2015-07-20 DIAGNOSIS — J4541 Moderate persistent asthma with (acute) exacerbation: Secondary | ICD-10-CM

## 2015-07-20 MED ORDER — METHYLPREDNISOLONE ACETATE 80 MG/ML IJ SUSP
80.0000 mg | Freq: Once | INTRAMUSCULAR | Status: AC
  Administered 2015-07-20: 80 mg via INTRAMUSCULAR

## 2015-07-20 MED ORDER — ALBUTEROL SULFATE (2.5 MG/3ML) 0.083% IN NEBU: 2.5000 mg | INHALATION_SOLUTION | Freq: Four times a day (QID) | RESPIRATORY_TRACT | Status: DC | PRN

## 2015-07-20 MED ORDER — PREDNISONE 20 MG PO TABS: ORAL_TABLET | ORAL | Status: DC

## 2015-07-20 NOTE — Patient Instructions (Addendum)
Take albuterol four times a day Take steroids, two pills once a day for 5 days Take blood pressure medicine every night

## 2015-07-20 NOTE — Progress Notes (Signed)
Subjective:    Patient ID: Nicholas Burns, male    DOB: 06/29/1950, 65 y.o.   MRN: [id number]  CC: Asthma and Cough   HPI: Nicholas Burns is a 65 y.o. male presenting for Asthma and Cough  Wheezing started about a week ago Took albuterol last night, taking just once day  About a month has been taking albuterol nightly Has been seeing a doctor in other clinic, been started on BP meds, not taking regularly, last over a week ago No fevers, no runny nose No headaches or vision changes Takes his BP meds irregularly, last time was over a week ago Is not sure what meds he is on  Relevant past medical, surgical, family and social history reviewed and updated as indicated. Interim medical history since our last visit reviewed. Allergies and medications reviewed and updated.    ROS: Per HPI unless specifically indicated above  History  Smoking status  . Never Smoker   Smokeless tobacco  . Not on file    Past Medical History Patient Active Problem List   Diagnosis Date Noted  . Unspecified asthma(493.90) 09/30/2013  . Poor dentition 09/30/2013    Current Outpatient Prescriptions  Medication Sig Dispense Refill  . albuterol (PROVENTIL) (2.5 MG/3ML) 0.083% nebulizer solution Take 3 mLs (2.5 mg total) by nebulization every 6 (six) hours as needed for wheezing or shortness of breath. 150 mL 1  . fluticasone (FLOVENT HFA) 110 MCG/ACT inhaler Inhale 1 puff into the lungs 2 (two) times daily. (Patient not taking: Reported on 07/20/2015) 1 Inhaler 12  . predniSONE (DELTASONE) 20 MG tablet 2 po at same time daily for 5 days 10 tablet 0   No current facility-administered medications for this visit.       Objective:    BP 175/104 mmHg  Pulse 75  Temp(Src) 97.7 F (36.5 C) (Oral)  Wt 239 lb (108.41 kg)  SpO2 96%  Wt Readings from Last 3 Encounters:  07/20/15 239 lb (108.41 kg)  11/02/13 234 lb 3.2 oz (106.232 kg)  09/30/13 231 lb (104.781 kg)     Gen: NAD, alert, cooperative  with exam, NCAT EYES: EOMI, no scleral injection or icterus ENT:  TMs pearly gray b/l, OP without erythema LYMPH: no cervical LAD CV: NRRR, normal S1/S2, no murmur, distal pulses 2+ b/l Resp: moving air fair, diffuse inspiratory and expiratory wheezes, speaking in complete sentences, normal WOB Abd: +BS, soft, NTND. Ext: No edema, warm Neuro: Alert and oriented, strength equal b/l UE and LE, coordination grossly normal MSK: normal muscle bulk     Assessment & Plan:    Nicholas Burns was seen today for asthma exacerbation, also with elevated BP, asymptomatic. Gave shot of depomedrol, start prednisone, take albuterol more often at home. RTC in 1 week, need to start controller asthma med. Also needs to bring back BP meds to clinic so we can see what he is taking. Take BP meds at home until seen back in clinic. Return precautions given. Says he wants to follow up here.  Diagnoses and all orders for this visit:  Asthma, moderate persistent, with acute exacerbation -     albuterol (PROVENTIL) (2.5 MG/3ML) 0.083% nebulizer solution; Take 3 mLs (2.5 mg total) by nebulization every 6 (six) hours as needed for wheezing or shortness of breath. -     predniSONE (DELTASONE) 20 MG tablet; 2 po at same time daily for 5 days -     methylPREDNISolone acetate (DEPO-MEDROL) injection 80 mg; Inject 1 mL (80 mg  total) into the muscle once.  Essential HTN Uncontrolled, as above, take home meds, bring bottles to clinic next week  Follow up plan: Return in about 6 days (around 07/26/2015) for recheck med, BP, asthma.  Assunta Found, MD [facility] 07/20/2015, 6:57 PM

## 2015-07-26 ENCOUNTER — Encounter: Payer: Self-pay | Admitting: Pediatrics

## 2015-07-26 ENCOUNTER — Ambulatory Visit (INDEPENDENT_AMBULATORY_CARE_PROVIDER_SITE_OTHER): Payer: BLUE CROSS/BLUE SHIELD | Admitting: Pediatrics

## 2015-07-26 VITALS — BP 151/90 | HR 64 | Temp 97.8°F | Ht 66.5 in | Wt 239.8 lb

## 2015-07-26 DIAGNOSIS — J4541 Moderate persistent asthma with (acute) exacerbation: Secondary | ICD-10-CM

## 2015-07-26 DIAGNOSIS — I10 Essential (primary) hypertension: Secondary | ICD-10-CM

## 2015-07-26 DIAGNOSIS — J309 Allergic rhinitis, unspecified: Secondary | ICD-10-CM | POA: Diagnosis not present

## 2015-07-26 MED ORDER — ALBUTEROL SULFATE (2.5 MG/3ML) 0.083% IN NEBU: 2.5000 mg | INHALATION_SOLUTION | Freq: Four times a day (QID) | RESPIRATORY_TRACT | Status: DC | PRN

## 2015-07-26 MED ORDER — CETIRIZINE HCL 10 MG PO TABS: 10.0000 mg | ORAL_TABLET | Freq: Every day | ORAL | Status: DC

## 2015-07-26 MED ORDER — FLUTICASONE-SALMETEROL 250-50 MCG/DOSE IN AEPB: 1.0000 | INHALATION_SPRAY | Freq: Two times a day (BID) | RESPIRATORY_TRACT | Status: DC

## 2015-07-26 MED ORDER — METHYLPREDNISOLONE ACETATE 80 MG/ML IJ SUSP
80.0000 mg | Freq: Once | INTRAMUSCULAR | Status: AC
  Administered 2015-07-26: 80 mg via INTRAMUSCULAR

## 2015-07-26 NOTE — Progress Notes (Signed)
Subjective:    Patient ID: Nicholas Burns, male    DOB: Mar 23, 1951, 65 y.o.   MRN: [id number]  CC: Blood Pressure Check and Medication Check   HPI: Nicholas Burns is a 65 y.o. male presenting for Blood Pressure Check and Medication Check  He thinks breathing is better He has not picked up prednisone yet He got a steroid injection at last office visit 6 days ago He is taking albuterol once at night as he was before Lives with two smokers, they often smoke inside the house Cigarette smoke and cooking smells tend to make breathing worse Spring time tends to make allergies worse and hot weather He does have animals at home Has never been tested for allergies Has been around cigarette smoke all of his life  No headaches, vision changes, dizziness. Thinks he is on lisinopril at home for BP though he is not sure  Depression screen [facility] 2/9 07/26/2015  Decreased Interest 0  Down, Depressed, Hopeless 0  PHQ - 2 Score 0     Relevant past medical, surgical, family and social history reviewed and updated as indicated. Interim medical history since our last visit reviewed. Allergies and medications reviewed and updated.    ROS: Per HPI unless specifically indicated above  History  Smoking status  . Never Smoker   Smokeless tobacco  . Not on file    Past Medical History Patient Active Problem List   Diagnosis Date Noted  . Unspecified asthma(493.90) 09/30/2013  . Poor dentition 09/30/2013       Objective:    BP 151/90 mmHg  Pulse 64  Temp(Src) 97.8 F (36.6 C) (Oral)  Ht 5' 6.5" (1.689 m)  Wt 239 lb 12.8 oz (108.773 kg)  BMI 38.13 kg/m2  Wt Readings from Last 3 Encounters:  07/26/15 239 lb 12.8 oz (108.773 kg)  07/20/15 239 lb (108.41 kg)  11/02/13 234 lb 3.2 oz (106.232 kg)     Gen: NAD, alert, cooperative with exam, NCAT EYES: EOMI, no scleral injection or icterus ENT:  TMs pearly gray b/l, OP without erythema LYMPH: no cervical LAD CV: NRRR, normal S1/S2,  no murmur, distal pulses 2+ b/l Resp: moving air fair, diffuse inspiratory and expiratory wheezing b/l, comfortable WOB.  Abd: +BS, soft, NTND. no guarding or organomegaly Ext: No edema, warm Neuro: Alert and oriented, strength equal b/l UE and LE, coordination grossly normal    Assessment & Plan:    Nicholas Burns was seen today for blood pressure check and medication check.  Diagnoses and all orders for this visit:  Asthma, moderate persistent, with acute exacerbation Has not yet picked up his prednisone. Says he will be unable to for another two weeks. Start controller medicine as below. Increase albuteorl frequency while wheezing. Discussed avoiding triggers. -     albuterol (PROVENTIL) (2.5 MG/3ML) 0.083% nebulizer solution; Take 3 mLs (2.5 mg total) by nebulization every 6 (six) hours as needed for wheezing or shortness of breath. -     Fluticasone-Salmeterol (ADVAIR DISKUS) 250-50 MCG/DOSE AEPB; Inhale 1 puff into the lungs 2 (two) times daily. -     methylPREDNISolone acetate (DEPO-MEDROL) injection 80 mg; Inject 1 mL (80 mg total) into the muscle once.  Essential hypertension Elevated today. Thinks he is on lisinopril at home, doesn't know the dose. Will call us back with dose and will send into pharmacy.  Allergic rhinitis, unspecified allergic rhinitis type -     cetirizine (ZYRTEC) 10 MG tablet; Take 1 tablet (10 mg total) by  mouth daily.   Follow up plan: Return in about 3 weeks (around 08/16/2015).  Assunta Found, MD [facility] 07/26/2015, 5:44 PM

## 2015-07-26 NOTE — Patient Instructions (Signed)
Call us at [phone] to let me know know what dose of lisinopril you need sent to the pharmacy.  Advair (purple disc) one puff twice a day, rinse mouth out after using advair with water.  Albuterol twice a day

## 2015-08-16 ENCOUNTER — Ambulatory Visit: Payer: BLUE CROSS/BLUE SHIELD | Admitting: Pediatrics

## 2015-08-17 ENCOUNTER — Encounter: Payer: Self-pay | Admitting: Pediatrics

## 2015-08-21 ENCOUNTER — Ambulatory Visit: Payer: BLUE CROSS/BLUE SHIELD | Admitting: Family Medicine

## 2015-09-13 ENCOUNTER — Ambulatory Visit (INDEPENDENT_AMBULATORY_CARE_PROVIDER_SITE_OTHER): Payer: BLUE CROSS/BLUE SHIELD | Admitting: Family Medicine

## 2015-09-13 ENCOUNTER — Encounter: Payer: Self-pay | Admitting: Family Medicine

## 2015-09-13 VITALS — BP 171/97 | HR 73 | Temp 97.1°F | Ht 66.5 in | Wt 241.8 lb

## 2015-09-13 DIAGNOSIS — I10 Essential (primary) hypertension: Secondary | ICD-10-CM

## 2015-09-13 DIAGNOSIS — J4530 Mild persistent asthma, uncomplicated: Secondary | ICD-10-CM | POA: Diagnosis not present

## 2015-09-13 MED ORDER — HYDROCHLOROTHIAZIDE 25 MG PO TABS: 25.0000 mg | ORAL_TABLET | Freq: Every day | ORAL | Status: DC

## 2015-09-13 NOTE — Progress Notes (Signed)
   Subjective:    Patient ID: Nicholas Burns, male    DOB: 1950-12-17, 65 y.o.   MRN: [id number]  HPI 65 year old gentleman here to follow-up his asthma. He says that prednisone may him jittery and he did not tolerate it well but he is unclear whether it was really the prednisone or the cetirizine that he also took. Today I also note that his blood pressure is elevated as it was at the previous visit pressure today is 171/97. In general he says that his asthma is better than it used to be but he still uses his albuterol inhaler nebulization frequently.  Patient Active Problem List   Diagnosis Date Noted  . Unspecified asthma(493.90) 09/30/2013  . Poor dentition 09/30/2013   Outpatient Encounter Prescriptions as of 09/13/2015  Medication Sig  . albuterol (PROVENTIL) (2.5 MG/3ML) 0.083% nebulizer solution Take 3 mLs (2.5 mg total) by nebulization every 6 (six) hours as needed for wheezing or shortness of breath.  . cetirizine (ZYRTEC) 10 MG tablet Take 1 tablet (10 mg total) by mouth daily.  . fluticasone (FLOVENT HFA) 110 MCG/ACT inhaler Inhale 1 puff into the lungs 2 (two) times daily.  . Fluticasone-Salmeterol (ADVAIR DISKUS) 250-50 MCG/DOSE AEPB Inhale 1 puff into the lungs 2 (two) times daily.  . [DISCONTINUED] predniSONE (DELTASONE) 20 MG tablet 2 po at same time daily for 5 days   No facility-administered encounter medications on file as of 09/13/2015.      Review of Systems  Constitutional: Negative.   HENT: Negative.   Respiratory: Positive for wheezing.   Cardiovascular: Negative.   Neurological: Negative.   Psychiatric/Behavioral: Negative.        Objective:   Physical Exam  Constitutional: He is oriented to person, place, and time. He appears well-developed and well-nourished.  Cardiovascular: Normal rate, regular rhythm and normal heart sounds.   Pulmonary/Chest: Effort normal and breath sounds normal.  Neurological: He is alert and oriented to person, place, and time.            Assessment & Plan:  1. Asthma, mild persistent, uncomplicated All of [place]. I believe with use of albuterol inhaler he would benefit from daily inhaled steroid. Two-week sample and then he is to let us know how he feels.  2. Essential hypertension H CTZ 25 mg daily. Recheck in 3 months  Wardell Honour MD

## 2015-11-14 ENCOUNTER — Encounter: Payer: Self-pay | Admitting: Physician Assistant

## 2015-11-14 ENCOUNTER — Ambulatory Visit (INDEPENDENT_AMBULATORY_CARE_PROVIDER_SITE_OTHER): Payer: BLUE CROSS/BLUE SHIELD | Admitting: Physician Assistant

## 2015-11-14 VITALS — BP 159/99 | HR 89 | Temp 99.6°F | Ht 66.5 in | Wt 250.4 lb

## 2015-11-14 DIAGNOSIS — S70911A Unspecified superficial injury of right hip, initial encounter: Secondary | ICD-10-CM | POA: Diagnosis not present

## 2015-11-14 DIAGNOSIS — J4530 Mild persistent asthma, uncomplicated: Secondary | ICD-10-CM | POA: Diagnosis not present

## 2015-11-14 DIAGNOSIS — J4541 Moderate persistent asthma with (acute) exacerbation: Secondary | ICD-10-CM | POA: Diagnosis not present

## 2015-11-14 DIAGNOSIS — W57XXXA Bitten or stung by nonvenomous insect and other nonvenomous arthropods, initial encounter: Secondary | ICD-10-CM | POA: Diagnosis not present

## 2015-11-14 MED ORDER — FLUTICASONE-SALMETEROL 250-50 MCG/DOSE IN AEPB: 1.0000 | INHALATION_SPRAY | Freq: Two times a day (BID) | RESPIRATORY_TRACT | Status: DC

## 2015-11-14 MED ORDER — ALBUTEROL SULFATE HFA 108 (90 BASE) MCG/ACT IN AERS: 2.0000 | INHALATION_SPRAY | Freq: Four times a day (QID) | RESPIRATORY_TRACT | Status: DC | PRN

## 2015-11-14 MED ORDER — ALBUTEROL SULFATE (2.5 MG/3ML) 0.083% IN NEBU
2.5000 mg | INHALATION_SOLUTION | Freq: Once | RESPIRATORY_TRACT | Status: AC
  Administered 2015-11-14: 2.5 mg via RESPIRATORY_TRACT

## 2015-11-14 NOTE — Progress Notes (Signed)
Subjective:     Patient ID: Nicholas Burns, male   DOB: 05/17/1951, 65 y.o.   MRN: [id number]  HPI Pt with a long hx of asthma He has run out of hi inhalers and now has assoc wheezing SOB Also with tick bite to the R hip ~ 1 week ago  Review of Systems  Constitutional: Negative.   HENT: Positive for congestion, postnasal drip and sinus pressure. Negative for ear discharge, ear pain, rhinorrhea, sneezing and sore throat.   Respiratory: Positive for cough, chest tightness and wheezing.   Cardiovascular: Negative.        Objective:   Physical Exam  Constitutional: He appears well-developed and well-nourished.  HENT:  Mouth/Throat: Oropharynx is clear and moist. No oropharyngeal exudate.  Neck: Neck supple.  Cardiovascular: Normal rate, regular rhythm and normal heart sounds.   Pulmonary/Chest: No respiratory distress. He has wheezes. He exhibits no tenderness.  Lymphadenopathy:    He has no cervical adenopathy.  Nursing note and vitals reviewed. Neb tx done in office today R hip with healing bite to lateral aspect No surrounding erythema, edema, induration, drainage or ulceration Post neb tx wheezing sig decreased and pt states feeling better     Assessment:     1. Asthma, mild persistent, uncomplicated   2. Tick bite   3. Asthma, moderate persistent, with acute exacerbation        Plan:     RF of inhalers today He has been unable to tolerate antihistamines and oral steroids in the past due to feeling of jittery when taking Continue to observe tick bite area since it looks good today Pt to f/u with regular provider regarding HTN F/U sooner prn

## 2015-11-14 NOTE — Patient Instructions (Signed)
Asthma, Adult Asthma is a recurring condition in which the airways tighten and narrow. Asthma can make it difficult to breathe. It can cause coughing, wheezing, and shortness of breath. Asthma episodes, also called asthma attacks, range from minor to life-threatening. Asthma cannot be cured, but medicines and lifestyle changes can help control it. CAUSES Asthma is believed to be caused by inherited (genetic) and environmental factors, but its exact cause is unknown. Asthma may be triggered by allergens, lung infections, or irritants in the air. Asthma triggers are different for each person. Common triggers include:   Animal dander.  Dust mites.  Cockroaches.  Pollen from trees or grass.  Mold.  Smoke.  Air pollutants such as dust, household cleaners, hair sprays, aerosol sprays, paint fumes, strong chemicals, or strong odors.  Cold air, weather changes, and winds (which increase molds and pollens in the air).  Strong emotional expressions such as crying or laughing hard.  Stress.  Certain medicines (such as aspirin) or types of drugs (such as beta-blockers).  Sulfites in foods and drinks. Foods and drinks that may contain sulfites include dried fruit, potato chips, and sparkling grape juice.  Infections or inflammatory conditions such as the flu, a cold, or an inflammation of the nasal membranes (rhinitis).  Gastroesophageal reflux disease (GERD).  Exercise or strenuous activity. SYMPTOMS Symptoms may occur immediately after asthma is triggered or many hours later. Symptoms include:  Wheezing.  Excessive nighttime or early morning coughing.  Frequent or severe coughing with a common cold.  Chest tightness.  Shortness of breath. DIAGNOSIS  The diagnosis of asthma is made by a review of your medical history and a physical exam. Tests may also be performed. These may include:  Lung function studies. These tests show how much air you breathe in and out.  Allergy  tests.  Imaging tests such as X-rays. TREATMENT  Asthma cannot be cured, but it can usually be controlled. Treatment involves identifying and avoiding your asthma triggers. It also involves medicines. There are 2 classes of medicine used for asthma treatment:   Controller medicines. These prevent asthma symptoms from occurring. They are usually taken every day.  Reliever or rescue medicines. These quickly relieve asthma symptoms. They are used as needed and provide short-term relief. Your health care provider will help you create an asthma action plan. An asthma action plan is a written plan for managing and treating your asthma attacks. It includes a list of your asthma triggers and how they may be avoided. It also includes information on when medicines should be taken and when their dosage should be changed. An action plan may also involve the use of a device called a peak flow meter. A peak flow meter measures how well the lungs are working. It helps you monitor your condition. HOME CARE INSTRUCTIONS   Take medicines only as directed by your health care provider. Speak with your health care provider if you have questions about how or when to take the medicines.  Use a peak flow meter as directed by your health care provider. Record and keep track of readings.  Understand and use the action plan to help minimize or stop an asthma attack without needing to seek medical care.  Control your home environment in the following ways to help prevent asthma attacks:  Do not smoke. Avoid being exposed to secondhand smoke.  Change your heating and air conditioning filter regularly.  Limit your use of fireplaces and wood stoves.  Get rid of pests (such as roaches   and mice) and their droppings.  Throw away plants if you see mold on them.  Clean your floors and dust regularly. Use unscented cleaning products.  Try to have someone else vacuum for you regularly. Stay out of rooms while they are  being vacuumed and for a short while afterward. If you vacuum, use a dust mask from a hardware store, a double-layered or microfilter vacuum cleaner bag, or a vacuum cleaner with a HEPA filter.  Replace carpet with wood, tile, or vinyl flooring. Carpet can trap dander and dust.  Use allergy-proof pillows, mattress covers, and box spring covers.  Wash bed sheets and blankets every week in hot water and dry them in a dryer.  Use blankets that are made of polyester or cotton.  Clean bathrooms and kitchens with bleach. If possible, have someone repaint the walls in these rooms with mold-resistant paint. Keep out of the rooms that are being cleaned and painted.  Wash hands frequently. SEEK MEDICAL CARE IF:   You have wheezing, shortness of breath, or a cough even if taking medicine to prevent attacks.  The colored mucus you cough up (sputum) is thicker than usual.  Your sputum changes from clear or white to yellow, green, gray, or bloody.  You have any problems that may be related to the medicines you are taking (such as a rash, itching, swelling, or trouble breathing).  You are using a reliever medicine more than 2-3 times per week.  Your peak flow is still at 50-79% of your personal best after following your action plan for 1 hour.  You have a fever. SEEK IMMEDIATE MEDICAL CARE IF:   You seem to be getting worse and are unresponsive to treatment during an asthma attack.  You are short of breath even at rest.  You get short of breath when doing very little physical activity.  You have difficulty eating, drinking, or talking due to asthma symptoms.  You develop chest pain.  You develop a fast heartbeat.  You have a bluish color to your lips or fingernails.  You are light-headed, dizzy, or faint.  Your peak flow is less than 50% of your personal best.   This information is not intended to replace advice given to you by your health care provider. Make sure you discuss any  questions you have with your health care provider.   Document Released: 05/05/2005 Document Revised: 01/24/2015 Document Reviewed: 12/02/2012 [employer] Interactive Patient Education 2016 [employer]  

## 2015-11-21 ENCOUNTER — Encounter: Payer: Self-pay | Admitting: Physician Assistant

## 2015-11-21 ENCOUNTER — Ambulatory Visit (INDEPENDENT_AMBULATORY_CARE_PROVIDER_SITE_OTHER): Payer: BLUE CROSS/BLUE SHIELD | Admitting: Physician Assistant

## 2015-11-21 VITALS — BP 143/93 | HR 77 | Temp 97.2°F | Ht 66.5 in | Wt 248.4 lb

## 2015-11-21 DIAGNOSIS — R600 Localized edema: Secondary | ICD-10-CM | POA: Diagnosis not present

## 2015-11-21 MED ORDER — HYDROCHLOROTHIAZIDE 25 MG PO TABS: 25.0000 mg | ORAL_TABLET | Freq: Every day | ORAL | Status: DC

## 2015-11-21 NOTE — Progress Notes (Signed)
Subjective:     Patient ID: Keyante Khoury, male   DOB: 09/01/1950, 65 y.o.   MRN: [id number]  HPI Pt with lower ext/feet edema No pain or numbness Has not been taking his HCTZ daily He has increased his soda intake Using inhalers  Review of Systems  Constitutional: Negative.   HENT: Negative.   Respiratory: Positive for cough and wheezing.   Cardiovascular: Positive for leg swelling. Negative for chest pain.       Objective:   Physical Exam  Constitutional: He appears well-developed and well-nourished.  HENT:  Mouth/Throat: Oropharynx is clear and moist. No oropharyngeal exudate.  Neck: Neck supple.  Cardiovascular: Normal rate, regular rhythm and intact distal pulses.   Pulmonary/Chest: Effort normal. He has no wheezes.  Musculoskeletal: He exhibits edema.  Edema to lower ext R>L No calf TTP Good pulses/sensory to feet bilat  Lymphadenopathy:    He has cervical adenopathy.  Nursing note and vitals reviewed.      Assessment:     1. Bilateral edema of lower extremity        Plan:     Decrease soda intake Increase water intake Take HCTZ daily RF of the HCTZ Use Alb inhaler F/U with regular provider

## 2015-11-21 NOTE — Patient Instructions (Signed)
Edema °Edema is an abnormal buildup of fluids in your body tissues. Edema is somewhat dependent on gravity to pull the fluid to the lowest place in your body. That makes the condition more common in the legs and thighs (lower extremities). Painless swelling of the feet and ankles is common and becomes more likely as you get older. It is also common in looser tissues, like around your eyes.  °When the affected area is squeezed, the fluid may move out of that spot and leave a dent for a few moments. This dent is called pitting.  °CAUSES  °There are many possible causes of edema. Eating too much salt and being on your feet or sitting for a long time can cause edema in your legs and ankles. Hot weather may make edema worse. Common medical causes of edema include: °· Heart failure. °· Liver disease. °· Kidney disease. °· Weak blood vessels in your legs. °· Cancer. °· An injury. °· Pregnancy. °· Some medications. °· Obesity.  °SYMPTOMS  °Edema is usually painless. Your skin may look swollen or shiny.  °DIAGNOSIS  °Your health care provider may be able to diagnose edema by asking about your medical history and doing a physical exam. You may need to have tests such as X-rays, an electrocardiogram, or blood tests to check for medical conditions that may cause edema.  °TREATMENT  °Edema treatment depends on the cause. If you have heart, liver, or kidney disease, you need the treatment appropriate for these conditions. General treatment may include: °· Elevation of the affected body part above the level of your heart. °· Compression of the affected body part. Pressure from elastic bandages or support stockings squeezes the tissues and forces fluid back into the blood vessels. This keeps fluid from entering the tissues. °· Restriction of fluid and salt intake. °· Use of a water pill (diuretic). These medications are appropriate only for some types of edema. They pull fluid out of your body and make you urinate more often. This  gets rid of fluid and reduces swelling, but diuretics can have side effects. Only use diuretics as directed by your health care provider. °HOME CARE INSTRUCTIONS  °· Keep the affected body part above the level of your heart when you are lying down.   °· Do not sit still or stand for prolonged periods.   °· Do not put anything directly under your knees when lying down. °· Do not wear constricting clothing or garters on your upper legs.   °· Exercise your legs to work the fluid back into your blood vessels. This may help the swelling go down.   °· Wear elastic bandages or support stockings to reduce ankle swelling as directed by your health care provider.   °· Eat a low-salt diet to reduce fluid if your health care provider recommends it.   °· Only take medicines as directed by your health care provider.  °SEEK MEDICAL CARE IF:  °· Your edema is not responding to treatment. °· You have heart, liver, or kidney disease and notice symptoms of edema. °· You have edema in your legs that does not improve after elevating them.   °· You have sudden and unexplained weight gain. °SEEK IMMEDIATE MEDICAL CARE IF:  °· You develop shortness of breath or chest pain.   °· You cannot breathe when you lie down. °· You develop pain, redness, or warmth in the swollen areas.   °· You have heart, liver, or kidney disease and suddenly get edema. °· You have a fever and your symptoms suddenly get worse. °MAKE SURE YOU:  °·   Understand these instructions. °· Will watch your condition. °· Will get help right away if you are not doing well or get worse. °  °This information is not intended to replace advice given to you by your health care provider. Make sure you discuss any questions you have with your health care provider. °  °Document Released: 05/05/2005 Document Revised: 05/26/2014 Document Reviewed: 02/25/2013 °Elsevier Interactive Patient Education ©2016 [employer] ° °

## 2016-01-09 ENCOUNTER — Other Ambulatory Visit: Payer: Self-pay | Admitting: Physician Assistant

## 2016-01-18 ENCOUNTER — Other Ambulatory Visit: Payer: Self-pay

## 2016-01-18 ENCOUNTER — Telehealth: Payer: Self-pay | Admitting: Family Medicine

## 2016-01-18 DIAGNOSIS — J4541 Moderate persistent asthma with (acute) exacerbation: Secondary | ICD-10-CM

## 2016-01-18 MED ORDER — ALBUTEROL SULFATE (2.5 MG/3ML) 0.083% IN NEBU: 2.5000 mg | INHALATION_SOLUTION | Freq: Four times a day (QID) | RESPIRATORY_TRACT | 1 refills | Status: DC | PRN

## 2016-03-12 ENCOUNTER — Other Ambulatory Visit: Payer: Self-pay | Admitting: Family Medicine

## 2016-03-12 DIAGNOSIS — R69 Illness, unspecified: Secondary | ICD-10-CM | POA: Diagnosis not present

## 2016-03-12 NOTE — Telephone Encounter (Signed)
lmtcb to clarify exactly which medications he needs refilled

## 2016-04-01 NOTE — Telephone Encounter (Signed)
Multiple attempts made to contact patient regarding medications.  This encounter will now be closed

## 2016-04-11 ENCOUNTER — Ambulatory Visit (INDEPENDENT_AMBULATORY_CARE_PROVIDER_SITE_OTHER): Payer: Medicare HMO | Admitting: Family Medicine

## 2016-04-11 VITALS — BP 156/99 | HR 86 | Temp 97.5°F | Resp 18 | Ht 66.5 in | Wt 244.4 lb

## 2016-04-11 DIAGNOSIS — R69 Illness, unspecified: Secondary | ICD-10-CM | POA: Diagnosis not present

## 2016-04-11 DIAGNOSIS — J4541 Moderate persistent asthma with (acute) exacerbation: Secondary | ICD-10-CM

## 2016-04-11 MED ORDER — ALBUTEROL SULFATE HFA 108 (90 BASE) MCG/ACT IN AERS: 2.0000 | INHALATION_SPRAY | Freq: Four times a day (QID) | RESPIRATORY_TRACT | 2 refills | Status: DC | PRN

## 2016-04-11 MED ORDER — PREDNISONE 20 MG PO TABS: ORAL_TABLET | ORAL | 0 refills | Status: DC

## 2016-04-11 MED ORDER — AZITHROMYCIN 250 MG PO TABS: ORAL_TABLET | ORAL | 0 refills | Status: DC

## 2016-04-11 MED ORDER — FLUTICASONE-SALMETEROL 250-50 MCG/DOSE IN AEPB: 1.0000 | INHALATION_SPRAY | Freq: Two times a day (BID) | RESPIRATORY_TRACT | 3 refills | Status: DC

## 2016-04-11 MED ORDER — ALBUTEROL SULFATE (2.5 MG/3ML) 0.083% IN NEBU: 2.5000 mg | INHALATION_SOLUTION | Freq: Four times a day (QID) | RESPIRATORY_TRACT | 1 refills | Status: DC | PRN

## 2016-04-11 MED ORDER — METHYLPREDNISOLONE ACETATE 80 MG/ML IJ SUSP
80.0000 mg | Freq: Once | INTRAMUSCULAR | Status: AC
  Administered 2016-04-11: 80 mg via INTRAMUSCULAR

## 2016-04-11 NOTE — Progress Notes (Signed)
BP (!) 156/99   Pulse 86   Temp 97.5 F (36.4 C) (Oral)   Resp 18   Ht 5' 6.5" (1.689 m)   Wt 244 lb 6.4 oz (110.9 kg)   SpO2 96%   BMI 38.86 kg/m    Subjective:    Patient ID: Nicholas Burns, male    DOB: [birth year], 65 y.o.   MRN: [id number]  HPI: Nicholas Burns is a 65 y.o. male presenting on 04/11/2016 for URI (chest congestion, cough, SOB)   HPI Cough and shortness of breath and wheezing Patient has a cough and shortness of breath and wheezing that's been worse over the past 6 weeks. He thinks he may have been exposed to some allergens but then it just has not been improving. He is using his Advair and is also using his albuterol inhaler at least once a day over the past 6 weeks. He denies any fevers or chills but has had significant wheezing and shortness of breath. He denies any sick contacts that he knows of.  Relevant past medical, surgical, family and social history reviewed and updated as indicated. Interim medical history since our last visit reviewed. Allergies and medications reviewed and updated.  Review of Systems  Constitutional: Negative for chills and fever.  HENT: Positive for congestion, postnasal drip, rhinorrhea, sinus pressure, sneezing and sore throat. Negative for ear discharge, ear pain and voice change.   Eyes: Negative for pain, discharge, redness and visual disturbance.  Respiratory: Positive for cough, chest tightness, shortness of breath and wheezing.   Cardiovascular: Negative for chest pain and leg swelling.  Musculoskeletal: Negative for gait problem.  Skin: Negative for rash.  All other systems reviewed and are negative.   Per HPI unless specifically indicated above      Objective:    BP (!) 156/99   Pulse 86   Temp 97.5 F (36.4 C) (Oral)   Resp 18   Ht 5' 6.5" (1.689 m)   Wt 244 lb 6.4 oz (110.9 kg)   SpO2 96%   BMI 38.86 kg/m   Wt Readings from Last 3 Encounters:  04/11/16 244 lb 6.4 oz (110.9 kg)  11/21/15 248 lb 6.4 oz  (112.7 kg)  11/14/15 250 lb 6.4 oz (113.6 kg)    Physical Exam  Constitutional: He is oriented to person, place, and time. He appears well-developed and well-nourished.  HENT:  Right Ear: Tympanic membrane, external ear and ear canal normal.  Left Ear: Tympanic membrane, external ear and ear canal normal.  Nose: Mucosal edema and rhinorrhea present. No sinus tenderness. No epistaxis. Right sinus exhibits maxillary sinus tenderness. Right sinus exhibits no frontal sinus tenderness. Left sinus exhibits maxillary sinus tenderness. Left sinus exhibits no frontal sinus tenderness.  Mouth/Throat: Uvula is midline and mucous membranes are normal. Posterior oropharyngeal edema and posterior oropharyngeal erythema present. No oropharyngeal exudate or tonsillar abscesses.  Eyes: Conjunctivae are normal. Right eye exhibits no discharge. Left eye exhibits no discharge. No scleral icterus.  Neck: Neck supple. No thyromegaly present.  Cardiovascular: Normal rate, regular rhythm, normal heart sounds and intact distal pulses.   No murmur heard. Pulmonary/Chest: Accessory muscle usage present. No tachypnea and no bradypnea. No respiratory distress. He has no decreased breath sounds. He has wheezes (Significant wheezes throughout and using some accessory muscles in his neck) in the right upper field, the right middle field, the right lower field, the left upper field, the left middle field and the left lower field. He has no rhonchi. He has  no rales.  Musculoskeletal: Normal range of motion. He exhibits no edema.  Lymphadenopathy:    He has no cervical adenopathy.  Neurological: He is alert and oriented to person, place, and time. Coordination normal.  Skin: Skin is warm and dry. No rash noted. He is not diaphoretic.  Psychiatric: He has a normal mood and affect. His behavior is normal.  Nursing note and vitals reviewed.     Assessment & Plan:   Problem List Items Addressed This Visit      Respiratory    Asthma - Primary   Relevant Medications   predniSONE (DELTASONE) 20 MG tablet   azithromycin (ZITHROMAX) 250 MG tablet   methylPREDNISolone acetate (DEPO-MEDROL) injection 80 mg (Start on 04/11/2016 11:30 AM)   Fluticasone-Salmeterol (ADVAIR DISKUS) 250-50 MCG/DOSE AEPB   albuterol (PROVENTIL) (2.5 MG/3ML) 0.083% nebulizer solution   albuterol (VENTOLIN HFA) 108 (90 Base) MCG/ACT inhaler       Follow up plan: Return in about 2 weeks (around 04/25/2016), or if symptoms worsen or fail to improve, for Recheck asthma.  Counseling provided for all of the vaccine components No orders of the defined types were placed in this encounter.   Caryl Pina, MD [facility] 04/11/2016, 11:21 AM

## 2016-04-25 ENCOUNTER — Ambulatory Visit (INDEPENDENT_AMBULATORY_CARE_PROVIDER_SITE_OTHER): Payer: Medicare HMO | Admitting: Family Medicine

## 2016-04-25 ENCOUNTER — Encounter: Payer: Self-pay | Admitting: Family Medicine

## 2016-04-25 VITALS — BP 134/96 | HR 64 | Temp 97.6°F | Ht 66.5 in | Wt 242.2 lb

## 2016-04-25 DIAGNOSIS — J4540 Moderate persistent asthma, uncomplicated: Secondary | ICD-10-CM | POA: Diagnosis not present

## 2016-04-25 DIAGNOSIS — Z23 Encounter for immunization: Secondary | ICD-10-CM | POA: Diagnosis not present

## 2016-04-25 MED ORDER — CETIRIZINE HCL 10 MG PO TABS: 10.0000 mg | ORAL_TABLET | Freq: Every day | ORAL | 6 refills | Status: DC

## 2016-04-25 MED ORDER — LISINOPRIL 20 MG PO TABS: 20.0000 mg | ORAL_TABLET | Freq: Every day | ORAL | 1 refills | Status: DC

## 2016-04-25 MED ORDER — HYDROCHLOROTHIAZIDE 25 MG PO TABS: 25.0000 mg | ORAL_TABLET | Freq: Every day | ORAL | 1 refills | Status: DC

## 2016-04-25 NOTE — Assessment & Plan Note (Signed)
Patient is feeling a lot better since the prednisone and azithromycin and has been using his albuterol. He has not use it all today and he is feeling a lot better but does admit he still has some wheezing. He did not get the Advair because of price but was encouraged to get.

## 2016-04-25 NOTE — Addendum Note (Signed)
Addended by: Michaela Corner on: 04/25/2016 01:38 PM   Modules accepted: Orders

## 2016-04-25 NOTE — Progress Notes (Signed)
BP (!) 134/96   Pulse 64   Temp 97.6 F (36.4 C) (Oral)   Ht 5' 6.5" (1.689 m)   Wt 242 lb 4 oz (109.9 kg)   BMI 38.51 kg/m    Subjective:    Patient ID: Nicholas Burns, male    DOB: 1950-07-20, 65 y.o.   MRN: [id number]  HPI: Nicholas Burns is a 65 y.o. male presenting on 04/25/2016 for Asthma (2 week followup; patient reports he is doing better)   HPI Asthma recheck Patient is coming in today for an asthma recheck. He was seen for an asthma exacerbation 2 weeks ago. He was given Z-Pak and prednisone and Advair and albuterol. He did not pick up the Advair but that everything else. He did not think the Advair because price. He says it was going to cost him $47. Today he does admit some wheezing but he is feeling a lot better and closer to his baseline. He is already finish the prednisone and Z-Pak and is continuing to use his albuterol but he says he hasn't used it in the past 4 days. He denies any fevers or chills or shortness of breath today.  Relevant past medical, surgical, family and social history reviewed and updated as indicated. Interim medical history since our last visit reviewed. Allergies and medications reviewed and updated.  Review of Systems  Constitutional: Negative for chills and fever.  HENT: Negative for congestion, rhinorrhea and sinus pain.   Respiratory: Positive for cough and wheezing. Negative for shortness of breath.   Cardiovascular: Negative for chest pain and leg swelling.  Musculoskeletal: Negative for back pain and gait problem.  Skin: Negative for rash.  All other systems reviewed and are negative.  Per HPI unless specifically indicated above      Objective:    BP (!) 134/96   Pulse 64   Temp 97.6 F (36.4 C) (Oral)   Ht 5' 6.5" (1.689 m)   Wt 242 lb 4 oz (109.9 kg)   BMI 38.51 kg/m   Wt Readings from Last 3 Encounters:  04/25/16 242 lb 4 oz (109.9 kg)  04/11/16 244 lb 6.4 oz (110.9 kg)  11/21/15 248 lb 6.4 oz (112.7 kg)    Physical  Exam  Constitutional: He is oriented to person, place, and time. He appears well-developed and well-nourished. No distress.  Eyes: Conjunctivae are normal. Right eye exhibits no discharge. Left eye exhibits no discharge. No scleral icterus.  Cardiovascular: Normal rate, regular rhythm, normal heart sounds and intact distal pulses.   No murmur heard. Pulmonary/Chest: Effort normal. No respiratory distress. He has wheezes. He has no rales.  Musculoskeletal: Normal range of motion. He exhibits no edema.  Neurological: He is alert and oriented to person, place, and time. Coordination normal.  Skin: Skin is warm and dry. No rash noted. He is not diaphoretic.  Psychiatric: He has a normal mood and affect. His behavior is normal.  Nursing note and vitals reviewed.     Assessment & Plan:   Problem List Items Addressed This Visit      Respiratory   Asthma - Primary    Patient is feeling a lot better since the prednisone and azithromycin and has been using his albuterol. He has not use it all today and he is feeling a lot better but does admit he still has some wheezing. He did not get the Advair because of price but was encouraged to get.          Follow up  plan: Return in about 4 weeks (around 05/23/2016), or if symptoms worsen or fail to improve, for Recheck hypertension and fasting labs.  Counseling provided for all of the vaccine components No orders of the defined types were placed in this encounter.   Caryl Pina, MD [facility] 04/25/2016, 11:26 AM

## 2016-05-14 DIAGNOSIS — R69 Illness, unspecified: Secondary | ICD-10-CM | POA: Diagnosis not present

## 2016-06-26 ENCOUNTER — Ambulatory Visit: Payer: Medicare HMO | Admitting: Pediatrics

## 2016-06-27 ENCOUNTER — Encounter: Payer: Self-pay | Admitting: Pediatrics

## 2016-08-18 ENCOUNTER — Telehealth: Payer: Self-pay | Admitting: Pediatrics

## 2016-08-19 ENCOUNTER — Encounter: Payer: Self-pay | Admitting: Family Medicine

## 2016-08-19 ENCOUNTER — Ambulatory Visit (INDEPENDENT_AMBULATORY_CARE_PROVIDER_SITE_OTHER): Payer: Medicare HMO | Admitting: Family Medicine

## 2016-08-19 VITALS — BP 163/102 | HR 67 | Temp 97.0°F | Ht 66.5 in | Wt 246.8 lb

## 2016-08-19 DIAGNOSIS — J4541 Moderate persistent asthma with (acute) exacerbation: Secondary | ICD-10-CM

## 2016-08-19 MED ORDER — PREDNISONE 20 MG PO TABS: ORAL_TABLET | ORAL | 0 refills | Status: DC

## 2016-08-19 MED ORDER — FLUTICASONE-SALMETEROL 500-50 MCG/DOSE IN AEPB: 1.0000 | INHALATION_SPRAY | Freq: Two times a day (BID) | RESPIRATORY_TRACT | 11 refills | Status: DC

## 2016-08-19 MED ORDER — AZITHROMYCIN 250 MG PO TABS: ORAL_TABLET | ORAL | 0 refills | Status: DC

## 2016-08-19 MED ORDER — ALBUTEROL SULFATE (2.5 MG/3ML) 0.083% IN NEBU
2.5000 mg | INHALATION_SOLUTION | Freq: Once | RESPIRATORY_TRACT | Status: AC
Start: 2016-08-19 — End: 2016-08-19
  Administered 2016-08-19: 2.5 mg via RESPIRATORY_TRACT

## 2016-08-19 NOTE — Progress Notes (Signed)
   HPI  Patient presents today here with cough and wheezing.  Patient explains for the last 2 weeks she's had increased shortness of breath, wheezing, and cough.  He's had some malaise and feeling ill. Today he felt like he may have an episode of vomiting from coughing, however he did not.  It looks like he had a recent asthma exacerbation 4 months ago. This was treated with azithromycin and prednisone he had very good improvement at that time.  Patient states he has a 1-2 hours improvement with albuterol,    PMH: Smoking status noted ROS: Per HPI  Objective: BP (!) 163/102   Pulse 67   Temp 97 F (36.1 C) (Oral)   Ht 5' 6.5" (1.689 m)   Wt 246 lb 12.8 oz (111.9 kg)   SpO2 95%   BMI 39.24 kg/m  Gen: NAD, alert, cooperative with exam HEENT: NCAT, oropharynx clear, TMs normal laterally, nares clear CV: RRR, good S1/S2, no murmur Resp: Mildly labored, diffuse inspiratory and expiratory wheezing, no other added sounds Ext: No edema, warm Neuro: Alert and oriented, No gross deficits  Her nebulizer respiratory exam: improved Aeration and air movement, continued wheezing work of breathing improved  Assessment and plan:  # Asthma exacerbation Prednisone plus azithromycin Maximize Advair to 500/50 twice daily Follow-up PCP as previously planned    Meds ordered this encounter  Medications  . predniSONE (DELTASONE) 20 MG tablet    Sig: 2 po at same time daily for 5 days    Dispense:  10 tablet    Refill:  0  . azithromycin (ZITHROMAX) 250 MG tablet    Sig: Take 2 tablets on day 1 and 1 tablet daily after that    Dispense:  6 tablet    Refill:  0  . Fluticasone-Salmeterol (ADVAIR DISKUS) 500-50 MCG/DOSE AEPB    Sig: Inhale 1 puff into the lungs 2 (two) times daily.    Dispense:  60 each    Refill:  [place], MD [facility] 08/19/2016, 4:06 PM

## 2016-08-19 NOTE — Patient Instructions (Signed)
Great to meet you!  I'd like to increase your advair to a higher dose, I have sent a new inhaler for you.

## 2016-10-14 ENCOUNTER — Telehealth: Payer: Self-pay | Admitting: Pediatrics

## 2016-11-12 NOTE — Telephone Encounter (Signed)
Scheduled

## 2016-11-21 ENCOUNTER — Ambulatory Visit: Payer: Medicare HMO | Admitting: Family

## 2016-11-24 ENCOUNTER — Encounter: Payer: Self-pay | Admitting: Pediatrics

## 2016-11-28 ENCOUNTER — Ambulatory Visit (INDEPENDENT_AMBULATORY_CARE_PROVIDER_SITE_OTHER): Payer: Medicare HMO | Admitting: Pediatrics

## 2016-11-28 ENCOUNTER — Encounter: Payer: Self-pay | Admitting: Pediatrics

## 2016-11-28 VITALS — BP 131/80 | HR 60 | Temp 97.0°F | Ht 66.5 in | Wt 243.2 lb

## 2016-11-28 DIAGNOSIS — M7989 Other specified soft tissue disorders: Secondary | ICD-10-CM | POA: Diagnosis not present

## 2016-11-28 DIAGNOSIS — R0600 Dyspnea, unspecified: Secondary | ICD-10-CM

## 2016-11-28 DIAGNOSIS — R0609 Other forms of dyspnea: Secondary | ICD-10-CM

## 2016-11-28 DIAGNOSIS — I10 Essential (primary) hypertension: Secondary | ICD-10-CM

## 2016-11-28 DIAGNOSIS — J4541 Moderate persistent asthma with (acute) exacerbation: Secondary | ICD-10-CM

## 2016-11-28 DIAGNOSIS — R739 Hyperglycemia, unspecified: Secondary | ICD-10-CM | POA: Diagnosis not present

## 2016-11-28 LAB — BAYER DCA HB A1C WAIVED: HB A1C (BAYER DCA - WAIVED): 7.1 % — ABNORMAL HIGH (ref <7.0)

## 2016-11-28 MED ORDER — ALBUTEROL SULFATE HFA 108 (90 BASE) MCG/ACT IN AERS: 2.0000 | INHALATION_SPRAY | Freq: Four times a day (QID) | RESPIRATORY_TRACT | 2 refills | Status: DC | PRN

## 2016-11-28 MED ORDER — FLUTICASONE-SALMETEROL 500-50 MCG/DOSE IN AEPB: 1.0000 | INHALATION_SPRAY | Freq: Two times a day (BID) | RESPIRATORY_TRACT | 11 refills | Status: DC

## 2016-11-28 NOTE — Progress Notes (Signed)
  Subjective:   Patient ID: Nicholas Burns, male    DOB: Jul 09, 1950, 66 y.o.   MRN: [id number] CC: Edema (Bilateral Feet, 6 weeks)  HPI: Nicholas Burns is a 66 y.o. male presenting for Edema (Bilateral Feet, 6 weeks)  Has h/o asthma, says he has had it since he was young Treated with advair, albuterol for years Does sometimes have wheezing episodes, has been seen several times in the past year for asthma exacerbations  Now has new DOE Always get SOB walking up a certain hill to get to town for the past few weeks  Lives in a house with two smokers, they smoke inside. pt is a never smoker  Ran out of HCTZ about a week ago  Has had swelling in b/l feet for the past week No SOB now  Drinks multiple sodas in a day, says he doesn't like diet or water  Relevant past medical, surgical, family and social history reviewed. Allergies and medications reviewed and updated. History  Smoking Status  . Never Smoker  Smokeless Tobacco  . Never Used   ROS: Per HPI   Objective:    BP 131/80   Pulse 60   Temp (!) 97 F (36.1 C) (Oral)   Ht 5' 6.5" (1.689 m)   Wt 243 lb 3.2 oz (110.3 kg)   BMI 38.67 kg/m   Wt Readings from Last 3 Encounters:  11/28/16 243 lb 3.2 oz (110.3 kg)  08/19/16 246 lb 12.8 oz (111.9 kg)  04/25/16 242 lb 4 oz (109.9 kg)    Gen: NAD, alert, cooperative with exam, NCAT EYES: EOMI, no conjunctival injection, or no icterus ENT:  TMs pearly gray b/l, OP without erythema LYMPH: no cervical LAD CV: NRRR, normal S1/S2, no murmur Resp: CTABL, no wheezes, normal WOB Abd: +BS, soft, NTND. no guarding or organomegaly Ext: trace pitting edema b/l LE, warm Neuro: Alert and oriented, strength equal b/l UE and LE, coordination grossly normal MSK: normal muscle bulk  EKG: HR 50, sinus bradycardia, no Twave inversions or qwaves  Assessment & Plan:  [place] was seen today for edema.  Diagnoses and all orders for this visit:  Essential hypertension Improved with  recheck Check labs, cont meds -     CMP14+EGFR -     Lipid panel -     EKG 12-Lead  Hyperglycemia A1c 7.1, new dx of diabetes Drinking multiple sodas in a day -     Bayer DCA Hb A1c Waived  Foot swelling -     TSH -     EKG 12-Lead  Moderate persistent asthma with acute exacerbation Stable, no wheezing today Avoid cig smoke Try to get family members to smoke outside -     Fluticasone-Salmeterol (ADVAIR DISKUS) 500-50 MCG/DOSE AEPB; Inhale 1 puff into the lungs 2 (two) times daily. -     albuterol (VENTOLIN HFA) 108 (90 Base) MCG/ACT inhaler; Inhale 2 puffs into the lungs every 6 (six) hours as needed for wheezing or shortness of breath.  Dyspnea on exertion New, has h/o asthma with frequent exacerbations Multiple CAD risk factors including new DM2 dx, HTN Will refer to cardiology for stress -     EKG 12-Lead   Follow up plan: Return in about 3 weeks (around 12/19/2016). Assunta Found, MD [facility]

## 2016-11-29 LAB — TSH: TSH: 4.19 u[IU]/mL (ref 0.450–4.500)

## 2016-11-29 LAB — LIPID PANEL
CHOLESTEROL TOTAL: 149 mg/dL (ref 100–199)
Chol/HDL Ratio: 3.5 ratio (ref 0.0–5.0)
HDL: 42 mg/dL (ref >39)
LDL Calculated: 88 mg/dL (ref 0–99)
Triglycerides: 96 mg/dL (ref 0–149)
VLDL CHOLESTEROL CAL: 19 mg/dL (ref 5–40)

## 2016-11-29 LAB — CMP14+EGFR
ALBUMIN: 4.5 g/dL (ref 3.6–4.8)
ALT: 16 IU/L (ref 0–44)
AST: 20 IU/L (ref 0–40)
Albumin/Globulin Ratio: 1.5 (ref 1.2–2.2)
Alkaline Phosphatase: 60 IU/L (ref 39–117)
BUN / CREAT RATIO: 9 — AB (ref 10–24)
BUN: 12 mg/dL (ref 8–27)
Bilirubin Total: 0.5 mg/dL (ref 0.0–1.2)
CHLORIDE: 103 mmol/L (ref 96–106)
CO2: 25 mmol/L (ref 20–29)
Calcium: 9.8 mg/dL (ref 8.6–10.2)
Creatinine, Ser: 1.32 mg/dL — ABNORMAL HIGH (ref 0.76–1.27)
GFR calc Af Amer: 65 mL/min/{1.73_m2} (ref >59)
GFR calc non Af Amer: 56 mL/min/{1.73_m2} — ABNORMAL LOW (ref >59)
GLUCOSE: 101 mg/dL — AB (ref 65–99)
Globulin, Total: 3.1 g/dL (ref 1.5–4.5)
POTASSIUM: 4.5 mmol/L (ref 3.5–5.2)
Sodium: 142 mmol/L (ref 134–144)
Total Protein: 7.6 g/dL (ref 6.0–8.5)

## 2016-11-29 MED ORDER — LISINOPRIL 20 MG PO TABS: 20.0000 mg | ORAL_TABLET | Freq: Every day | ORAL | 1 refills | Status: DC

## 2016-11-29 MED ORDER — HYDROCHLOROTHIAZIDE 25 MG PO TABS: 25.0000 mg | ORAL_TABLET | Freq: Every day | ORAL | 1 refills | Status: DC

## 2016-11-29 NOTE — Addendum Note (Signed)
Addended by: Eustaquio Maize on: 11/29/2016 08:31 AM   Modules accepted: Orders

## 2016-12-01 ENCOUNTER — Other Ambulatory Visit: Payer: Self-pay | Admitting: *Deleted

## 2016-12-01 MED ORDER — METFORMIN HCL 500 MG PO TABS: 500.0000 mg | ORAL_TABLET | Freq: Every day | ORAL | 3 refills | Status: DC

## 2016-12-02 ENCOUNTER — Telehealth: Payer: Self-pay | Admitting: Pediatrics

## 2016-12-03 NOTE — Telephone Encounter (Signed)
Patient called back and got results today.

## 2016-12-11 ENCOUNTER — Ambulatory Visit (INDEPENDENT_AMBULATORY_CARE_PROVIDER_SITE_OTHER): Payer: Medicare HMO | Admitting: Pediatrics

## 2016-12-11 ENCOUNTER — Encounter: Payer: Self-pay | Admitting: Pediatrics

## 2016-12-11 VITALS — BP 128/86 | HR 63 | Temp 97.3°F | Ht 66.5 in | Wt 239.4 lb

## 2016-12-11 DIAGNOSIS — Z1211 Encounter for screening for malignant neoplasm of colon: Secondary | ICD-10-CM

## 2016-12-11 DIAGNOSIS — E119 Type 2 diabetes mellitus without complications: Secondary | ICD-10-CM | POA: Diagnosis not present

## 2016-12-11 DIAGNOSIS — J45901 Unspecified asthma with (acute) exacerbation: Secondary | ICD-10-CM | POA: Diagnosis not present

## 2016-12-11 DIAGNOSIS — E1169 Type 2 diabetes mellitus with other specified complication: Secondary | ICD-10-CM | POA: Diagnosis not present

## 2016-12-11 DIAGNOSIS — E785 Hyperlipidemia, unspecified: Secondary | ICD-10-CM

## 2016-12-11 MED ORDER — PREDNISONE 20 MG PO TABS: ORAL_TABLET | ORAL | 0 refills | Status: DC

## 2016-12-11 MED ORDER — PRAVASTATIN SODIUM 40 MG PO TABS: 40.0000 mg | ORAL_TABLET | Freq: Every day | ORAL | 3 refills | Status: DC

## 2016-12-11 MED ORDER — AZITHROMYCIN 250 MG PO TABS: ORAL_TABLET | ORAL | 0 refills | Status: DC

## 2016-12-11 NOTE — Progress Notes (Signed)
  Subjective:   Patient ID: Nicholas Burns, male    DOB: [birth year], 66 y.o.   MRN: [id number] CC: diabetes, med prob f/u  HPI: Nicholas Burns is a 66 y.o. male presenting for med follow up  Breathing--somewhat improved since restarting advair Hot weather has made it harder  Worse with exertion  Using albuterol multiple times a day last few days Feels like he is wheezing more Several family members smoke in the house, he doesn't think it affects his breathing  Coughing started several days ago Minimally productive Usually has no cough  New dx of Dm2 Drinking some sugary beverages Here today with his son  No fam h/o colon ca,   Relevant past medical, surgical, family and social history reviewed. Allergies and medications reviewed and updated. History  Smoking Status  . Never Smoker  Smokeless Tobacco  . Never Used   ROS: Per HPI   Objective:    BP 128/86   Pulse 63   Temp (!) 97.3 F (36.3 C) (Oral)   Ht 5' 6.5" (1.689 m)   Wt 239 lb 6.4 oz (108.6 kg)   BMI 38.06 kg/m   Wt Readings from Last 3 Encounters:  12/11/16 239 lb 6.4 oz (108.6 kg)  11/28/16 243 lb 3.2 oz (110.3 kg)  08/19/16 246 lb 12.8 oz (111.9 kg)    Gen: NAD, alert, cooperative with exam, NCAT EYES: EOMI, no conjunctival injection, or no icterus ENT:  TMs pearly gray b/l, OP without erythema LYMPH: no cervical LAD CV: NRRR, normal S1/S2, no murmur, distal pulses 2+ b/l Resp: wheezing throughout with expiration, decreased air movement, speaking in complete sentences Abd: +BS, soft, NTND Ext: No edema, warm Neuro: Alert and oriented  Assessment & Plan:  [name] was seen today for med follow up  Diagnoses and all orders for this visit:  Exacerbation of persistent asthma, unspecified asthma severity On advair, ongoing symptoms Treat for exacerbation as below Return precautions discussed -     predniSONE (DELTASONE) 20 MG tablet; 2 po at same time daily for 3 days -     azithromycin (ZITHROMAX)  250 MG tablet; Take 2 the first day and then one each day after. -     Ambulatory referral to Pulmonology  Hyperlipidemia associated with type 2 diabetes mellitus ([place]) Start below -     pravastatin (PRAVACHOL) 40 MG tablet; Take 1 tablet (40 mg total) by mouth daily.  Type 2 diabetes mellitus without complication, without long-term current use of insulin (HCC) Recent diagnosis A1c 7.2, started metformin, has been tolerating metformin Decrease sugary beverage intake -     Microalbumin / creatinine urine ratio  Colon cancer screening -     Ambulatory referral to Gastroenterology   Follow up plan: 3 mo Assunta Found, MD [facility]

## 2016-12-12 ENCOUNTER — Encounter (INDEPENDENT_AMBULATORY_CARE_PROVIDER_SITE_OTHER): Payer: Self-pay | Admitting: *Deleted

## 2016-12-12 LAB — MICROALBUMIN / CREATININE URINE RATIO
Creatinine, Urine: 139.1 mg/dL
MICROALB/CREAT RATIO: 4.1 mg/g{creat} (ref 0.0–30.0)
MICROALBUM., U, RANDOM: 5.7 ug/mL

## 2017-01-26 ENCOUNTER — Ambulatory Visit (INDEPENDENT_AMBULATORY_CARE_PROVIDER_SITE_OTHER): Payer: Medicare HMO | Admitting: Internal Medicine

## 2017-01-26 ENCOUNTER — Encounter: Payer: Self-pay | Admitting: Internal Medicine

## 2017-01-26 VITALS — BP 114/70 | HR 60 | Ht 66.0 in | Wt 237.2 lb

## 2017-01-26 DIAGNOSIS — R0602 Shortness of breath: Secondary | ICD-10-CM | POA: Insufficient documentation

## 2017-01-26 DIAGNOSIS — I10 Essential (primary) hypertension: Secondary | ICD-10-CM | POA: Diagnosis not present

## 2017-01-26 DIAGNOSIS — R0609 Other forms of dyspnea: Secondary | ICD-10-CM | POA: Diagnosis not present

## 2017-01-26 DIAGNOSIS — R0600 Dyspnea, unspecified: Secondary | ICD-10-CM

## 2017-01-26 MED ORDER — IRBESARTAN 150 MG PO TABS: 150.0000 mg | ORAL_TABLET | Freq: Every day | ORAL | 11 refills | Status: DC

## 2017-01-26 MED ORDER — PREDNISONE 10 MG PO TABS: ORAL_TABLET | ORAL | 0 refills | Status: DC

## 2017-01-26 NOTE — Patient Instructions (Addendum)
Stop advair and lisinopril   Avapro 150 mg one daily  If light headed standing, take one half daily instead    Only use your albuterol as a rescue medication to be used if you can't catch your breath by resting or doing a relaxed purse lip breathing pattern.  - The less you use it, the better it will work when you need it. - Ok to use up to 2 puffs  every 4 hours if you must but call for immediate appointment if use goes up over your usual need - Don't leave home without it !!  (think of it like the spare tire for your car)    If getting worse > Prednisone 10 mg take  4 each am x 2 days,   2 each am x 2 days,  1 each am x 2 days and stop    Please schedule a follow up office visit in 4 weeks, sooner if needed  with all medications /inhalers/ solutions in hand so we can verify exactly what you are taking. This includes all medications from all doctors and over the counters

## 2017-01-26 NOTE — Progress Notes (Signed)
Subjective:     Patient ID: Nicholas Burns, male   DOB: 03/14/1951,   MRN: [id number]  HPI  45 yobm never smoker / retired from [employer] work around summer 2017 with "no trouble with breathing" though first placed on flovent 10/2013 per epic records(did not take) and has had chronic nasal congestion  X years much worse p quit and place on multiple on allergy / asthma meds no better referred to pulmonary clinic 01/26/2017 by Dr   Assunta Found    01/26/2017 1st [facility] office visit/ Emeree Mahler   Chief Complaint  Patient presents with  . Pulm Consult    Consult for asmtha referred by Dr. Assunta Found. Pt is having SOB daily with exerstion, has dry cough off and on. Pt is having slight tightness in chest today.  no sob at rest/ props up at hs or rolling on side but cannot lie flat immediately feels strangling sensation/sob  Doe fine flatin a/c  =  Nl pace Does better p prednisone but son still hears wheezing and never back to baseline x over a year when needed no resp rx at all   No obvious day to day or daytime variability or assoc excess/ purulent sputum or mucus plugs or hemoptysis or cp or chest tightness, subjective wheeze or overt   hb symptoms. No unusual exp hx or h/o childhood pna/ asthma or knowledge of premature birth.  Sleeping ok flat without nocturnal  or early am exacerbation  of respiratory  c/o's or need for noct saba. Also denies any obvious fluctuation of symptoms with weather or environmental changes or other aggravating or alleviating factors except as outlined above   Current Allergies, Complete Past Medical History, Past Surgical History, Family History, and Social History were reviewed in [facility] record.  ROS  The following are not active complaints unless bolded sore throat, dysphagia, dental problems, itching, sneezing,  nasal congestion or disharge of excess mucus or purulent secretions, ear ache,   fever, chills, sweats, unintended wt loss  or wt gain, classically pleuritic or exertional cp,  orthopnea pnd or leg swelling, presyncope, palpitations, abdominal pain, anorexia, nausea, vomiting, diarrhea  or change in bowel habits or bladder habits, change in stools or change in urine, dysuria, hematuria,  rash, arthralgias, visual complaints, headache, numbness, weakness or ataxia or problems with walking or coordination,  change in mood/affect = anxious or memory.        Current Meds  Medication Sig  . albuterol (PROVENTIL) (2.5 MG/3ML) 0.083% nebulizer solution Take 3 mLs (2.5 mg total) by nebulization every 6 (six) hours as needed for wheezing or shortness of breath.  Marland Kitchen albuterol (VENTOLIN HFA) 108 (90 Base) MCG/ACT inhaler Inhale 2 puffs into the lungs every 6 (six) hours as needed for wheezing or shortness of breath.  Marland Kitchen azithromycin (ZITHROMAX) 250 MG tablet Take 2 the first day and then one each day after.  . cetirizine (ZYRTEC) 10 MG tablet Take 1 tablet (10 mg total) by mouth daily.  . hydrochlorothiazide (HYDRODIURIL) 25 MG tablet Take 1 tablet (25 mg total) by mouth daily.  . metFORMIN (GLUCOPHAGE) 500 MG tablet Take 1 tablet (500 mg total) by mouth daily with breakfast.  . pravastatin (PRAVACHOL) 40 MG tablet Take 1 tablet (40 mg total) by mouth daily.  . [DISCONTINUED] Fluticasone-Salmeterol (ADVAIR DISKUS) 500-50 MCG/DOSE AEPB Inhale 1 puff into the lungs 2 (two) times daily.  . [DISCONTINUED] lisinopril (PRINIVIL,ZESTRIL) 20 MG tablet Take 1 tablet (20 mg total) by mouth  daily.         Review of Systems     Objective:   Physical Exam     Massively obese amb bm nad  Wt Readings from Last 3 Encounters:  01/26/17 237 lb 3.2 oz (107.6 kg)  12/11/16 239 lb 6.4 oz (108.6 kg)  11/28/16 243 lb 3.2 oz (110.3 kg)    Vital signs reviewed - Note on arrival 02 sats  95% on RA      HEENT: nl dentition, turbinates bilaterally, and oropharynx. Nl external ear canals without cough reflex   NECK :  without JVD/Nodes/TM/  nl carotid upstrokes bilaterally   LUNGS: no acc muscle use,  Nl contour chest which is clear to A and P bilaterally without cough on insp or exp maneuvers   CV:  RRR  no s3 or murmur or increase in P2, and no edema   ABD:  soft and nontender with nl inspiratory excursion in the supine position. No bruits or organomegaly appreciated, bowel sounds nl  MS:  Nl gait/ ext warm without deformities, calf tenderness, cyanosis or clubbing No obvious joint restrictions   SKIN: warm and dry without lesions    NEURO:  alert, approp, nl sensorium with  no motor or cerebellar deficits apparent.     CXR PA and Lateral:   01/26/2017 :    I personally reviewed images and agree with radiology impression as follows:   Did not go to xray     Assessment:

## 2017-01-26 NOTE — Assessment & Plan Note (Addendum)
Trial off acei 01/26/2017 >>>    Symptoms are markedly disproportionate to objective findings and not clear this is actually much of a  lung problem but pt does appear to have difficult to sort out respiratory symptoms of unknown origin for which  DDX  = almost all start with A and  include Adherence, Ace Inhibitors, Acid Reflux, Active Sinus Disease, Alpha 1 Antitripsin deficiency, Anxiety masquerading as Airways dz,  ABPA,  Allergy(esp in young), Aspiration (esp in elderly), Adverse effects of meds,  Active smokers, A bunch of PE's (a small clot burden can't cause this syndrome unless there is already severe underlying pulm or vascular dz with poor reserve) plus two Bs  = Bronchiectasis and Beta blocker use..and one C= CHF     Adherence is always the initial "prime suspect" and is a multilayered concern that requires a "trust but verify" approach in every patient - starting with knowing how to use medications, especially inhalers, correctly, keeping up with refills and understanding the fundamental difference between maintenance and prns vs those medications only taken for a very short course and then stopped and not refilled. - return with all meds in hand using a trust but verify approach to confirm accurate Medication  Reconciliation The principal here is that until we are certain that the  patients are doing what we've asked, it makes no sense to ask them to do more.   ACEi adverse effects at the  top of the usual list of suspects and the only way to rule it out is a trial off > see HBP a/p    ? Adverse effects of DPI > try off   ? Allergy > unusual onset so late in life but  response to pred is suggestive so if flares off acei rec another 6 day course then re-eval at nex ov    Total time devoted to counseling  > 50 % of initial 60 min office visit:  review case with pt/son discussion of options/alternatives/ personally creating written customized instructions  in presence of pt  then going over  those specific  Instructions directly with the pt including how to use all of the meds but in particular covering each new medication in detail and the difference between the maintenance= "automatic" meds and the prns using an action plan format for the latter (If this problem/symptom => do that organization reading Left to right).  Please see AVS from this visit for a full list of these instructions which I personally wrote for this pt and  are unique to this visit.

## 2017-01-27 NOTE — Assessment & Plan Note (Signed)
In the best review of chronic cough to date ( NEJM 2016 375 [phone]) ,  ACEi are now felt to cause cough in up to  20% of pts which is a 4 fold increase from previous reports and does not include the variety of non-specific complaints we see in pulmonary clinic in pts on ACEi but previously attributed to another dx like  Copd/asthma and  include PNDS, throat and chest congestion, "bronchitis", unexplained dyspnea and noct "strangling" sensations, and hoarseness, but also  atypical /refractory GERD symptoms like dysphagia and "bad heartburn"   The only way I know  to prove this is not an "ACEi Case" is a trial off ACEi x a minimum of 6 weeks then regroup.   Try avapro 150 mg one daily - take one half daily if too strong

## 2017-02-06 ENCOUNTER — Other Ambulatory Visit: Payer: Self-pay | Admitting: Family Medicine

## 2017-02-11 ENCOUNTER — Other Ambulatory Visit: Payer: Self-pay | Admitting: Pediatrics

## 2017-02-11 DIAGNOSIS — J4541 Moderate persistent asthma with (acute) exacerbation: Secondary | ICD-10-CM

## 2017-02-27 ENCOUNTER — Ambulatory Visit: Payer: Medicare HMO | Admitting: Internal Medicine

## 2017-03-11 ENCOUNTER — Ambulatory Visit: Payer: Medicare HMO | Admitting: Internal Medicine

## 2017-03-16 ENCOUNTER — Other Ambulatory Visit (INDEPENDENT_AMBULATORY_CARE_PROVIDER_SITE_OTHER): Payer: Medicare HMO

## 2017-03-16 ENCOUNTER — Ambulatory Visit (INDEPENDENT_AMBULATORY_CARE_PROVIDER_SITE_OTHER)
Admission: RE | Admit: 2017-03-16 | Discharge: 2017-03-16 | Disposition: A | Discharge status: Home / Self Care | Payer: Medicare HMO | Source: Ambulatory Visit | Attending: Internal Medicine | Admitting: Internal Medicine

## 2017-03-16 ENCOUNTER — Encounter: Payer: Self-pay | Admitting: Internal Medicine

## 2017-03-16 ENCOUNTER — Ambulatory Visit (INDEPENDENT_AMBULATORY_CARE_PROVIDER_SITE_OTHER): Payer: Medicare HMO | Admitting: Internal Medicine

## 2017-03-16 VITALS — BP 110/80 | HR 60 | Ht 67.0 in | Wt 237.0 lb

## 2017-03-16 DIAGNOSIS — J4540 Moderate persistent asthma, uncomplicated: Secondary | ICD-10-CM | POA: Diagnosis not present

## 2017-03-16 DIAGNOSIS — R0600 Dyspnea, unspecified: Secondary | ICD-10-CM

## 2017-03-16 DIAGNOSIS — R0609 Other forms of dyspnea: Secondary | ICD-10-CM

## 2017-03-16 DIAGNOSIS — I10 Essential (primary) hypertension: Secondary | ICD-10-CM

## 2017-03-16 LAB — CBC WITH DIFFERENTIAL/PLATELET
BASOS PCT: 0.6 % (ref 0.0–3.0)
Basophils Absolute: 0.1 10*3/uL (ref 0.0–0.1)
EOS PCT: 0.7 % (ref 0.0–5.0)
Eosinophils Absolute: 0.1 10*3/uL (ref 0.0–0.7)
HCT: 43 % (ref 39.0–52.0)
Hemoglobin: 14.3 g/dL (ref 13.0–17.0)
LYMPHS ABS: 1.7 10*3/uL (ref 0.7–4.0)
Lymphocytes Relative: 18.6 % (ref 12.0–46.0)
MCHC: 33.2 g/dL (ref 30.0–36.0)
MCV: 90.7 fl (ref 78.0–100.0)
MONO ABS: 0.6 10*3/uL (ref 0.1–1.0)
Monocytes Relative: 6.2 % (ref 3.0–12.0)
NEUTROS ABS: 6.7 10*3/uL (ref 1.4–7.7)
NEUTROS PCT: 73.9 % (ref 43.0–77.0)
Platelets: 189 10*3/uL (ref 150.0–400.0)
RBC: 4.74 Mil/uL (ref 4.22–5.81)
RDW: 13.4 % (ref 11.5–15.5)
WBC: 9.1 10*3/uL (ref 4.0–10.5)

## 2017-03-16 LAB — BASIC METABOLIC PANEL
BUN: 22 mg/dL (ref 6–23)
CHLORIDE: 99 meq/L (ref 96–112)
CO2: 31 meq/L (ref 19–32)
CREATININE: 1.34 mg/dL (ref 0.40–1.50)
Calcium: 10.5 mg/dL (ref 8.4–10.5)
GFR: 68.59 mL/min (ref >60.00)
GLUCOSE: 92 mg/dL (ref 70–99)
Potassium: 3.7 mEq/L (ref 3.5–5.1)
Sodium: 139 mEq/L (ref 135–145)

## 2017-03-16 LAB — BRAIN NATRIURETIC PEPTIDE: PRO B NATRI PEPTIDE: 46 pg/mL (ref 0.0–100.0)

## 2017-03-16 LAB — TSH: TSH: 1.54 u[IU]/mL (ref 0.35–4.50)

## 2017-03-16 MED ORDER — BUDESONIDE-FORMOTEROL FUMARATE 80-4.5 MCG/ACT IN AERO: 2.0000 | INHALATION_SPRAY | Freq: Two times a day (BID) | RESPIRATORY_TRACT | 11 refills | Status: DC

## 2017-03-16 MED ORDER — BUDESONIDE-FORMOTEROL FUMARATE 80-4.5 MCG/ACT IN AERO: 2.0000 | INHALATION_SPRAY | Freq: Two times a day (BID) | RESPIRATORY_TRACT | 0 refills | Status: DC

## 2017-03-16 MED ORDER — FAMOTIDINE 20 MG PO TABS: ORAL_TABLET | ORAL | Status: AC

## 2017-03-16 NOTE — Progress Notes (Signed)
Subjective:     Patient ID: Nicholas Burns, male   DOB: 02-13-1951,   MRN: [id number]    Brief patient profile:  43 yobm never smoker / retired from forklift work around summer 2017 with "no trouble with breathing" though first placed on flovent 10/2013 per epic records(did not take) and has had chronic nasal congestion  X years with cough and sob and chest tightness   and placed on multiple on allergy / asthma meds no better referred to pulmonary clinic 01/26/2017 by Dr   Assunta Found      History of Present Illness  01/26/2017 1st [facility] office visit/ Jelissa Espiritu   Chief Complaint  Patient presents with  . Pulm Consult    Consult for asmtha referred by Dr. Assunta Found. Pt is having SOB daily with exerstion, has dry cough off and on. Pt is having slight tightness in chest today.  no sob at rest/ props up at hs or rolling on side but cannot lie flat immediately feels strangling sensation/sob  Does fine flatin a/c  =  Nl pace Does better p prednisone but son still hears wheezing and never back to baseline x over a year when needed no resp rx at all  rec Avapro 150 mg one daily  If light headed standing, take one half daily instead  Only use your albuterol as a rescue medication  If getting worse > Prednisone 10 mg take  4 each am x 2 days,   2 each am x 2 days,  1 each am x 2 days and stop  Please schedule a follow up office visit in 4 weeks, sooner if needed  with all medications /inhalers/ solutions in hand so we can verify exactly what you are taking. This includes all medications from all doctors and over the counters    03/16/2017  f/u ov/Patton Rabinovich re:  ? Asthma vs uacs ? Off acei  Chief Complaint  Patient presents with  . Follow-up    Breathing is unchanged. He is using his albuterol inhaler at least once per day and neb with albuterol 3-4 x per wk.   last albuterol at hs =  about 14h prior to OV  / also feels he needs saba when walks up hills    Extremely poor insight into meds/  did not bring them nor his rescue as rec  No obvious day to day or daytime variability or assoc excess/ purulent sputum or mucus plugs or hemoptysis or cp or chest tightness, subjective wheeze or overt sinus or hb symptoms. No unusual exp hx or h/o childhood pna/ asthma or knowledge of premature birth.   Also denies any obvious fluctuation of symptoms with weather or environmental changes or other aggravating or alleviating factors except as outlined above   Current Allergies, Complete Past Medical History, Past Surgical History, Family History, and Social History were reviewed in [employer] record.  ROS  The following are not active complaints unless bolded Hoarseness, sore throat, dysphagia, dental problems, itching, sneezing,  nasal congestion or discharge of excess mucus or purulent secretions, ear ache,   fever, chills, sweats, unintended wt loss or wt gain, classically pleuritic or exertional cp,  orthopnea pnd or leg swelling, presyncope, palpitations, abdominal pain, anorexia, nausea, vomiting, diarrhea  or change in bowel habits or change in bladder habits, change in stools or change in urine, dysuria, hematuria,  rash, arthralgias, visual complaints, headache, numbness, weakness or ataxia or problems with walking or coordination,  change in mood/affect  or memory.        Current Meds - not able to verify any meds at all   Medication Sig  . albuterol (PROVENTIL) (2.5 MG/3ML) 0.083% nebulizer solution Take 3 mLs (2.5 mg total) by nebulization every 6 (six) hours as needed for wheezing or shortness of breath.  Marland Kitchen azithromycin (ZITHROMAX) 250 MG tablet Take 2 the first day and then one each day after.  . cetirizine (ZYRTEC) 10 MG tablet Take 1 tablet (10 mg total) by mouth daily.  . hydrochlorothiazide (HYDRODIURIL) 25 MG tablet Take 1 tablet (25 mg total) by mouth daily.  . irbesartan (AVAPRO) 150 MG tablet Take 1 tablet (150 mg total) by mouth daily.  . metFORMIN  (GLUCOPHAGE) 500 MG tablet TAKE 1 TABLET EVERY MORNING WITH BREAKFAST  . pravastatin (PRAVACHOL) 40 MG tablet Take 1 tablet (40 mg total) by mouth daily.  . predniSONE (DELTASONE) 10 MG tablet Take  4 each am x 2 days,   2 each am x 2 days,  1 each am x 2 days and stop  . VENTOLIN HFA 108 (90 Base) MCG/ACT inhaler USE 2 PUFFS EVERY 6 HOURS AS NEEDED                Objective:   Physical Exam   Massively obese amb bm nad  03/16/2017     237   01/26/17 237 lb 3.2 oz (107.6 kg)  12/11/16 239 lb 6.4 oz (108.6 kg)  11/28/16 243 lb 3.2 oz (110.3 kg)    Vital signs reviewed - Note on arrival 02 sats  94% on RA and BP   110/80 ? p daily meds ? Which ones     HEENT: nl dentition, turbinates bilaterally, and oropharynx. Nl external ear canals without cough reflex   NECK :  without JVD/Nodes/TM/ nl carotid upstrokes bilaterally   LUNGS: no acc muscle use,  Nl contour chest with "wheeze" on exp much better on plm    CV:  RRR  no s3 or murmur or increase in P2, and no edema   ABD: massively obese but  soft and nontender with nl inspiratory excursion in the supine position. No bruits or organomegaly appreciated, bowel sounds nl  MS:  Nl gait/ ext warm without deformities, calf tenderness, cyanosis or clubbing No obvious joint restrictions   SKIN: warm and dry without lesions    NEURO:  alert, approp, nl sensorium with  no motor or cerebellar deficits apparent.      CXR PA and Lateral:   03/16/2017 :    I personally reviewed images and agree with radiology impression as follows:    No active cardiopulmonary disease.   Labs ordered 03/16/2017  Allergy profile   Labs ordered/ reviewed:    Chemistry      Component Value Date/Time   NA 139 03/16/2017 1358   NA 142 11/28/2016 1239   K 3.7 03/16/2017 1358   CL 99 03/16/2017 1358   CO2 31 03/16/2017 1358   BUN 22 03/16/2017 1358   BUN 12 11/28/2016 1239   CREATININE 1.34 03/16/2017 1358      Component Value Date/Time    CALCIUM 10.5 03/16/2017 1358   ALKPHOS 60 11/28/2016 1239   AST 20 11/28/2016 1239   ALT 16 11/28/2016 1239   BILITOT 0.5 11/28/2016 1239        Lab Results  Component Value Date   WBC 9.1 03/16/2017   HGB 14.3 03/16/2017   HCT 43.0 03/16/2017   MCV 90.7 03/16/2017  PLT 189.0 03/16/2017       EOS                       0.1                                                                   03/16/2017       Lab Results  Component Value Date   TSH 1.54 03/16/2017     Lab Results  Component Value Date   PROBNP 46.0 03/16/2017                 Assessment:

## 2017-03-16 NOTE — Patient Instructions (Addendum)
Plan A = Automatic = symbicort 80 Take 2 puffs first thing in am and then another 2 puffs about 12 hours later.   Work on inhaler technique:  relax and gently blow all the way out then take a nice smooth deep breath back in, triggering the inhaler at same time you start breathing in.  Hold for up to 5 seconds if you can. Blow out thru nose. Rinse and gargle with water when done     Plan B = Backup Only use your albuterol as a rescue medication to be used if you can't catch your breath by resting or doing a relaxed purse lip breathing pattern.  - The less you use it, the better it will work when you need it. - Ok to use the inhaler up to 2 puffs  every 4 hours if you must but call for appointment if use goes up over your usual need - Don't leave home without it !!  (think of it like the spare tire for your car)    Add Pepcid 20 mg to take one hour before bedtime to see if helps your breathing at night   For drainage / throat tickle stop zyrtec and try take CHLORPHENIRAMINE  4 mg - take one every 4 hours as needed - available over the counter- may cause drowsiness so start with just a bedtime dose or two and see how you tolerate it before trying in daytime    Please remember to go to the lab and x-ray department downstairs in the basement  for your tests - we will call you with the results when they are available.      Please schedule a follow up office visit in 4 weeks, sooner if needed  with PFTs on return - also bring all medications /inhalers/ solutions in hand so we can verify exactly what you are taking. This includes all medications from all doctors and over the counters

## 2017-03-17 LAB — RESPIRATORY ALLERGY PROFILE REGION II ~~LOC~~
ALLERGEN, A. ALTERNATA, M6: 0.14 kU/L — AB
ALLERGEN, CEDAR TREE, T6: 2.27 kU/L — AB
ALLERGEN, COMM SILVER BIRCH, T3: 2.34 kU/L — AB
ALLERGEN, MULBERRY, T70: 2.45 kU/L — AB
ASPERGILLUS FUMIGATUS M3: 0.21 kU/L — AB
Allergen, Cottonwood, t14: 2.71 kU/L — ABNORMAL HIGH
Allergen, D pternoyssinus,d7: 9.27 kU/L — ABNORMAL HIGH
Allergen, Mouse Urine Protein, e78: 0.11 kU/L — ABNORMAL HIGH
Allergen, Oak,t7: 2.87 kU/L — ABNORMAL HIGH
Bermuda Grass: 2.91 kU/L — ABNORMAL HIGH
Box Elder IgE: 3.16 kU/L — ABNORMAL HIGH
CAT DANDER: 0.18 kU/L — AB
CLADOSPORIUM HERBARUM (M2) IGE: 0.13 kU/L — AB
CLASS: 0
CLASS: 0
CLASS: 0
CLASS: 0
CLASS: 0
CLASS: 2
CLASS: 2
CLASS: 2
CLASS: 2
CLASS: 2
CLASS: 2
CLASS: 3
COMMON RAGWEED (SHORT) (W1) IGE: 3.12 kU/L — ABNORMAL HIGH
Class: 0
Class: 1
Class: 2
Class: 2
Class: 2
Class: 2
Class: 2
Class: 2
Class: 2
Class: 2
Class: 3
Class: 4
Cockroach: 33.8 kU/L — ABNORMAL HIGH
D. FARINAE: 9.25 kU/L — AB
Dog Dander: 0.54 kU/L — ABNORMAL HIGH
Elm IgE: 3.03 kU/L — ABNORMAL HIGH
IgE (Immunoglobulin E), Serum: 516 kU/L — ABNORMAL HIGH (ref <=114)
Johnson Grass: 2.95 kU/L — ABNORMAL HIGH
Pecan/Hickory Tree IgE: 2.56 kU/L — ABNORMAL HIGH
ROUGH PIGWEED IGE: 2.9 kU/L — AB
Sheep Sorrel IgE: 2.91 kU/L — ABNORMAL HIGH
Timothy Grass: 3.2 kU/L — ABNORMAL HIGH

## 2017-03-17 LAB — INTERPRETATION:

## 2017-03-17 NOTE — Progress Notes (Signed)
LMTCB

## 2017-03-18 ENCOUNTER — Ambulatory Visit: Payer: Medicare HMO | Admitting: Pediatrics

## 2017-03-18 DIAGNOSIS — J4540 Moderate persistent asthma, uncomplicated: Secondary | ICD-10-CM | POA: Insufficient documentation

## 2017-03-18 NOTE — Assessment & Plan Note (Signed)
Body mass index is 37.12 kg/m.  -  trending no change  Lab Results  Component Value Date   TSH 1.54 03/16/2017     Contributing to gerd risk/ doe/reviewed the need and the process to achieve and maintain neg calorie balance > defer f/u primary care including intermittently monitoring thyroid status

## 2017-03-18 NOTE — Assessment & Plan Note (Signed)
Spirometry 03/16/2017  FEV1 1.25 (47%)  Ratio 83 but did not blow out x 6 sec and has typical curvature - 03/16/2017  After extensive coaching HFA effectiveness =    75% from a baseline of 25 % > try symb 160 2bid -Allergy profile  03/16/17  >  Eos 0.1 /  IgE 516 RAST pos for every resp allergen    It does indeed appear that he has chronic asthma although it's still not clear as to severity because I don't believe we have accurate information either as to what medicines he is actually taking the day nor do we have an accurate spirometer.  I proposed therefore maintaining on Symbicort 160 2bid for a 4 week trial then returning here for full PFTs while also continuing off of all ace inhibitors so as not to confuse the picture.  I had an extended discussion with the patient reviewing all relevant studies completed to date and  lasting 15 to 20 minutes of a 25 minute visit    Each maintenance medication was reviewed in detail including most importantly the difference between maintenance and prns and under what circumstances the prns are to be triggered using an action plan format that is not reflected in the computer generated alphabetically organized AVS.    Please see AVS for specific instructions unique to this visit that I personally wrote and verbalized to the the pt in detail and then reviewed with pt  by my nurse highlighting any  changes in therapy recommended at today's visit to their plan of care.

## 2017-03-18 NOTE — Assessment & Plan Note (Signed)
No evidence of CHF,  Anemia,  or thyroid dz

## 2017-03-18 NOTE — Progress Notes (Signed)
LMTCB

## 2017-03-18 NOTE — Assessment & Plan Note (Signed)
Acei off 01/26/2017 >  No better 03/16/2017   Blood pressure is well-controlled on his present regimen which I believe consists of Avapro 150 daily but unable to confirm this. In any case, although even in retrospect it may not be clear the ACEi contributed to the pt's symptoms,  Pt improved off them and adding them back at this point or in the future would risk confusion in interpretation of non-specific respiratory symptoms to which this patient is prone  ie  Better not to muddy the waters here.   Needs to return with all meds in hand using a trust but verify approach to confirm accurate Medication  Reconciliation The principal here is that until we are certain that the  patients are doing what we've asked, it makes no sense to ask them to do more.

## 2017-03-19 NOTE — Progress Notes (Signed)
LMTCB

## 2017-03-23 NOTE — Progress Notes (Signed)
ATC, NA and no option to leave msg 

## 2017-03-24 ENCOUNTER — Encounter: Payer: Self-pay | Admitting: Internal Medicine

## 2017-03-24 NOTE — Progress Notes (Signed)
Letter mailed to the pt. 

## 2017-03-27 ENCOUNTER — Encounter: Payer: Self-pay | Admitting: Pediatrics

## 2017-03-27 ENCOUNTER — Ambulatory Visit (INDEPENDENT_AMBULATORY_CARE_PROVIDER_SITE_OTHER): Payer: Medicare HMO | Admitting: Pediatrics

## 2017-03-27 VITALS — BP 135/83 | HR 60 | Temp 98.1°F | Ht 67.0 in | Wt 241.4 lb

## 2017-03-27 DIAGNOSIS — Z1211 Encounter for screening for malignant neoplasm of colon: Secondary | ICD-10-CM | POA: Diagnosis not present

## 2017-03-27 DIAGNOSIS — E785 Hyperlipidemia, unspecified: Secondary | ICD-10-CM | POA: Diagnosis not present

## 2017-03-27 DIAGNOSIS — E119 Type 2 diabetes mellitus without complications: Secondary | ICD-10-CM | POA: Diagnosis not present

## 2017-03-27 DIAGNOSIS — I10 Essential (primary) hypertension: Secondary | ICD-10-CM

## 2017-03-27 DIAGNOSIS — Z23 Encounter for immunization: Secondary | ICD-10-CM | POA: Diagnosis not present

## 2017-03-27 DIAGNOSIS — E1169 Type 2 diabetes mellitus with other specified complication: Secondary | ICD-10-CM

## 2017-03-27 DIAGNOSIS — Z1159 Encounter for screening for other viral diseases: Secondary | ICD-10-CM

## 2017-03-27 LAB — BAYER DCA HB A1C WAIVED: HB A1C: 5.9 % (ref <7.0)

## 2017-03-27 MED ORDER — ONETOUCH DELICA LANCETS FINE MISC: 1.0000 | Freq: Three times a day (TID) | 12 refills | Status: AC

## 2017-03-27 MED ORDER — GLUCOSE BLOOD VI STRP: ORAL_STRIP | 12 refills | Status: DC

## 2017-03-27 MED ORDER — ONETOUCH ULTRASOFT LANCETS MISC: 12 refills | Status: DC

## 2017-03-27 MED ORDER — ONETOUCH VERIO W/DEVICE KIT: 1.0000 | PACK | Freq: Three times a day (TID) | 0 refills | Status: AC

## 2017-03-27 MED ORDER — GLUCOSE BLOOD VI STRP: 1.0000 | ORAL_STRIP | Freq: Three times a day (TID) | 12 refills | Status: AC

## 2017-03-27 NOTE — Progress Notes (Signed)
  Subjective:   Patient ID: Nicholas Burns, male    DOB: 30-Aug-1950, 66 y.o.   MRN: [id number] CC: Follow-up (3 month) med problems  HPI: Nicholas Burns is a 66 y.o. male presenting for Follow-up (3 month)  Here today with his son  Asthma: followed up with Dr Melvyn Novas Taking symbicort twice a day most days, sometimes misses a dose Does think symptoms are better since taking regularly Still gets SOB when walking long distances  Diabetes: drinking sodas multiple times a day Started on metformin last visit, tolerating well  HLD: taking statin daily, no s/e  HTN: no chest pain with exertion, no HA  Relevant past medical, surgical, family and social history reviewed. Allergies and medications reviewed and updated. Social History   Tobacco Use  Smoking Status Never Smoker  Smokeless Tobacco Never Used   ROS: Per HPI   Objective:    BP 135/83   Pulse 60   Temp 98.1 F (36.7 C) (Oral)   Ht 5\' 7"  (1.702 m)   Wt 241 lb 6.4 oz (109.5 kg)   BMI 37.81 kg/m   Wt Readings from Last 3 Encounters:  03/27/17 241 lb 6.4 oz (109.5 kg)  03/16/17 237 lb (107.5 kg)  01/26/17 237 lb 3.2 oz (107.6 kg)    Gen: NAD, alert, cooperative with exam, NCAT EYES: EOMI, no conjunctival injection, or no icterus ENT: OP without erythema LYMPH: no cervical LAD CV: NRRR, normal S1/S2, no murmur, distal pulses 2+ b/l Resp: CTABL, no wheezes, normal WOB Abd: +BS, soft, NTND. Ext: No edema, warm Neuro: Alert and oriented MSK: normal muscle bulk  Assessment & Plan:  Nicholas Burns was seen today for follow-up med problems  Diagnoses and all orders for this visit:  Diabetes mellitus without complication ([place]) J6B 5.9, down from 7.1 Cont to decrease sugar intake -     Bayer DCA Hb A1c Waived -     Microalbumin / creatinine urine ratio  Colon cancer screening Declines colonoscopy, agrees to below -     Fecal occult blood, imunochemical; Future  Need for hepatitis C screening test -     Hepatitis C  antibody  Encounter for immunization -     Flu vaccine HIGH DOSE PF  Hyperlipidemia associated with type 2 diabetes mellitus ([place]) Taking statin daily  Essential hypertension Stable, cont meds  Follow up plan: 6 mo Assunta Found, MD [place]

## 2017-03-28 LAB — MICROALBUMIN / CREATININE URINE RATIO: CREATININE, UR: 94.8 mg/dL

## 2017-03-28 LAB — HEPATITIS C ANTIBODY: HEP C VIRUS AB: 0.1 {s_co_ratio} (ref 0.0–0.9)

## 2017-04-03 ENCOUNTER — Ambulatory Visit (INDEPENDENT_AMBULATORY_CARE_PROVIDER_SITE_OTHER): Payer: Medicare HMO | Admitting: *Deleted

## 2017-04-03 DIAGNOSIS — Z1211 Encounter for screening for malignant neoplasm of colon: Secondary | ICD-10-CM | POA: Diagnosis not present

## 2017-04-04 LAB — FECAL OCCULT BLOOD, IMMUNOCHEMICAL: FECAL OCCULT BLD: POSITIVE — AB

## 2017-04-10 ENCOUNTER — Encounter: Payer: Self-pay | Admitting: *Deleted

## 2017-04-14 ENCOUNTER — Ambulatory Visit: Payer: Medicare HMO | Admitting: Internal Medicine

## 2017-04-15 NOTE — Progress Notes (Signed)
error 

## 2017-04-17 ENCOUNTER — Encounter (INDEPENDENT_AMBULATORY_CARE_PROVIDER_SITE_OTHER): Payer: Self-pay | Admitting: *Deleted

## 2017-04-29 ENCOUNTER — Ambulatory Visit: Payer: Medicare HMO | Admitting: Internal Medicine

## 2017-05-13 ENCOUNTER — Encounter: Payer: Self-pay | Admitting: Internal Medicine

## 2017-05-13 ENCOUNTER — Ambulatory Visit (INDEPENDENT_AMBULATORY_CARE_PROVIDER_SITE_OTHER): Payer: Medicare HMO | Admitting: Internal Medicine

## 2017-05-13 VITALS — BP 134/96 | HR 73 | Ht 67.0 in | Wt 244.0 lb

## 2017-05-13 DIAGNOSIS — I10 Essential (primary) hypertension: Secondary | ICD-10-CM | POA: Diagnosis not present

## 2017-05-13 DIAGNOSIS — R0600 Dyspnea, unspecified: Secondary | ICD-10-CM

## 2017-05-13 DIAGNOSIS — R0609 Other forms of dyspnea: Secondary | ICD-10-CM

## 2017-05-13 DIAGNOSIS — J4540 Moderate persistent asthma, uncomplicated: Secondary | ICD-10-CM

## 2017-05-13 LAB — PULMONARY FUNCTION TEST
DL/VA % pred: 121 %
DL/VA: 5.38 ml/min/mmHg/L
DLCO cor % pred: 91 %
DLCO cor: 25.97 ml/min/mmHg
DLCO unc % pred: 89 %
DLCO unc: 25.29 ml/min/mmHg
FEF 25-75 Post: 1.54 L/sec
FEF 25-75 Pre: 1.68 L/sec
FEF2575-%Change-Post: -8 %
FEF2575-%Pred-Post: 64 %
FEF2575-%Pred-Pre: 70 %
FEV1-%Change-Post: -1 %
FEV1-%Pred-Post: 69 %
FEV1-%Pred-Pre: 70 %
FEV1-Post: 1.83 L
FEV1-Pre: 1.86 L
FEV1FVC-%Change-Post: -2 %
FEV1FVC-%Pred-Pre: 102 %
FEV6-%Change-Post: 1 %
FEV6-%Pred-Post: 72 %
FEV6-%Pred-Pre: 71 %
FEV6-Post: 2.38 L
FEV6-Pre: 2.35 L
FEV6FVC-%Pred-Post: 105 %
FEV6FVC-%Pred-Pre: 105 %
FVC-%Change-Post: 1 %
FVC-%Pred-Post: 68 %
FVC-%Pred-Pre: 68 %
FVC-Post: 2.38 L
FVC-Pre: 2.35 L
Post FEV1/FVC ratio: 77 %
Post FEV6/FVC ratio: 100 %
Pre FEV1/FVC ratio: 79 %
Pre FEV6/FVC Ratio: 100 %
RV % pred: 170 %
RV: 3.76 L
TLC % pred: 100 %
TLC: 6.42 L

## 2017-05-13 NOTE — Progress Notes (Signed)
PFT completed today 05/13/17  

## 2017-05-13 NOTE — Progress Notes (Signed)
Subjective:     Patient ID: Nicholas Burns, male   DOB: 12-20-1950,   MRN: [id number]  Brief patient profile:  19 yobm never smoker / retired from forklift work around summer 2017 with "no trouble with breathing" though first placed on flovent 10/2013 per epic records(did not take) and has had chronic nasal congestion  X years much worse p quit and place on multiple on allergy / asthma meds no better referred to pulmonary clinic 01/26/2017 by Dr   Assunta Found     History of Present Illness  01/26/2017 1st [facility] office visit/ Elayjah Chaney   Chief Complaint  Patient presents with  . Pulm Consult    Consult for asmtha referred by Dr. Assunta Found. Pt is having SOB daily with exerstion, has dry cough off and on. Pt is having slight tightness in chest today.  no sob at rest/ props up at hs or rolling on side but cannot lie flat immediately feels strangling sensation/sob  Doe fine flat in a/c  =  Nl pace Does better p prednisone but son still hears wheezing and never back to baseline x over a year when needed no resp rx at all  rec Plan A = Automatic = symbicort 80 Take 2 puffs first thing in am and then another 2 puffs about 12 hours later.  Work on inhaler technique:  relax and gently blow all the way out then take a nice smooth deep breath back in, triggering the inhaler at same time you start breathing in.  Hold for up to 5 seconds if you can. Blow out thru nose. Rinse and gargle with water when done Plan B = Backup Only use your albuterol as a rescue medication  Add Pepcid 20 mg to take one hour before bedtime to see if helps your breathing at night  For drainage / throat tickle stop zyrtec and try take CHLORPHENIRAMINE  4 mg  Please schedule a follow up office visit in 4 weeks, sooner if needed  with PFTs on return - also bring all medications /inhalers/ solutions in hand so we can verify exactly what you are taking. This includes all medications from all doctors and over the  counters    05/13/2017  f/u ov/Akari Defelice re: ? Asthma vs pseudoasthma/ MO  Chief Complaint  Patient presents with  . Follow-up    follow up PFT, SOB w/ Activity   not convinced any better on symb/ took last saba > 4 h prior to pfts today that don't show any airflow obst but ERV=11% Doe still = MMRC1 = can walk nl pace, flat grade, can't hurry or go uphills or steps s sob   No cough  No sleep issues if stays off his back   No obvious day to day or daytime variability or assoc excess/ purulent sputum or mucus plugs or hemoptysis or cp or chest tightness, subjective wheeze or overt sinus or hb symptoms. No unusual exposure hx or h/o childhood pna/ asthma or knowledge of premature birth.  Sleeping ok on side/ horizontal  without nocturnal  or early am exacerbation  of respiratory  c/o's or need for noct saba. Also denies any obvious fluctuation of symptoms with weather or environmental changes or other aggravating or alleviating factors except as outlined above   Current Allergies, Complete Past Medical History, Past Surgical History, Family History, and Social History were reviewed in [employer] record.  ROS  The following are not active complaints unless bolded Hoarseness, sore throat, dysphagia,  dental problems, itching, sneezing,  nasal congestion or discharge of excess mucus or purulent secretions, ear ache,   fever, chills, sweats, unintended wt loss or wt gain, classically pleuritic or exertional cp,  orthopnea pnd or hands/feet swelling, presyncope, palpitations, abdominal pain, anorexia, nausea, vomiting, diarrhea  or change in bowel habits or change in bladder habits, change in stools or change in urine, dysuria, hematuria,  rash, arthralgias, visual complaints, headache, numbness, weakness or ataxia or problems with walking or coordination,  change in mood/affect or memory.        Current Meds  Medication Sig  . albuterol (PROVENTIL) (2.5 MG/3ML) 0.083% nebulizer  solution Take 3 mLs (2.5 mg total) by nebulization every 6 (six) hours as needed for wheezing or shortness of breath.  . Blood Glucose Monitoring Suppl (ONETOUCH VERIO) w/Device KIT 1 each 3 (three) times daily by Does not apply route.  . famotidine (PEPCID) 20 MG tablet One at bedtime  . glucose blood (ONETOUCH VERIO) test strip 1 each 3 (three) times daily by Other route. Use as instructed  . hydrochlorothiazide (HYDRODIURIL) 25 MG tablet Take 1 tablet (25 mg total) by mouth daily.  . irbesartan (AVAPRO) 150 MG tablet Take 1 tablet (150 mg total) by mouth daily.  . metFORMIN (GLUCOPHAGE) 500 MG tablet TAKE 1 TABLET EVERY MORNING WITH BREAKFAST  . ONETOUCH DELICA LANCETS FINE MISC 1 each 3 (three) times daily by Does not apply route.  . pravastatin (PRAVACHOL) 40 MG tablet Take 1 tablet (40 mg total) by mouth daily.                   Objective:   Physical Exam  Pleasant amb bm    05/13/2017     244  01/26/17 237 lb 3.2 oz (107.6 kg)  12/11/16 239 lb 6.4 oz (108.6 kg)  11/28/16 243 lb 3.2 oz (110.3 kg)     Vital signs reviewed - Note on arrival 02 sats  97% on RA    HEENT: nl dentition, turbinates bilaterally, and oropharynx. Nl external ear canals without cough reflex   NECK :  without JVD/Nodes/TM/ nl carotid upstrokes bilaterally   LUNGS: no acc muscle use,  Nl contour chest which is clear to A and P bilaterally without cough on insp or exp maneuvers   CV:  RRR  no s3 or murmur or increase in P2, and no edema   ABD:  Obese/soft and nontender with nl inspiratory excursion in the supine position. No bruits or organomegaly appreciated, bowel sounds nl  MS:  Nl gait/ ext warm without deformities, calf tenderness, cyanosis or clubbing No obvious joint restrictions   SKIN: warm and dry without lesions    NEURO:  alert, approp, nl sensorium with  no motor or cerebellar deficits apparent.         I personally reviewed images and agree with radiology impression as  follows:  CXR:   03/16/17 No active cardiopulmonary disease.    Assessment:

## 2017-05-13 NOTE — Patient Instructions (Addendum)
Weight control is simply a matter of calorie balance which needs to be tilted in your favor by eating less and exercising more.  To get the most out of exercise, you need to be continuously aware that you are short of breath, but never out of breath, for 30 minutes daily. As you improve, it will actually be easier for you to do the same amount of exercise  in  30 minutes so always push to the level where you are short of breath.  If this does not result in gradual weight reduction then I strongly recommend you see again your nutritionist/dietician  with a food diary x 2 weeks so that we can work out a negative calorie balance which is universally effective in steady weight loss programs.  Think of your calorie balance like you do your bank account where in this case you want the balance to go down so you must take in less calories than you burn up.  It's just that simple:  Hard to do, but easy to understand.  Good luck!    Only use your albuterol as a rescue medication to be used if you can't catch your breath by resting or doing a relaxed purse lip breathing pattern.  - The less you use it, the better it will work when you need it. - Ok to use up to 2 puffs  every 4 hours if you must but call for immediate appointment if use goes up over your usual need - Don't leave home without it !!  (think of it like the spare tire for your car)   If you start needing more and more albuterol then start symbicort 80 Take 2 puffs first thing in am and then another 2 puffs about 12 hours later.    Pulmonary follow up is as needed but bring all inhalers with you  Late add refer to allergy

## 2017-05-20 ENCOUNTER — Encounter: Payer: Self-pay | Admitting: Internal Medicine

## 2017-05-20 ENCOUNTER — Telehealth: Payer: Self-pay | Admitting: *Deleted

## 2017-05-20 DIAGNOSIS — J4540 Moderate persistent asthma, uncomplicated: Secondary | ICD-10-CM

## 2017-05-20 NOTE — Telephone Encounter (Signed)
Pt missed the phone call [phone]

## 2017-05-20 NOTE — Assessment & Plan Note (Signed)
Not Adequate control on present rx, reviewed in detail with pt > avoid salt, return to pcp with all active meds in hand

## 2017-05-20 NOTE — Telephone Encounter (Signed)
LMTCB

## 2017-05-20 NOTE — Telephone Encounter (Signed)
-----   Message from Tanda Rockers, MD sent at 05/20/2017  5:54 AM EST ----- It appears I did not address his allergies at ov as I had intended but if has ongoing nasal symptoms would strongly suggests allergy eval > [facility] unless already established somewhere else

## 2017-05-20 NOTE — Telephone Encounter (Signed)
Spoke with pt letting him know that if he was still having nasal symptoms that Dr. Melvyn Novas wanted him to have an allergy eval by Dr. [facility]. Pt stated to me that he is still having the symptoms and is fine with the evaluation.  Will place an order for pt. Nothing further needed.

## 2017-05-20 NOTE — Assessment & Plan Note (Addendum)
Spirometry 03/16/2017  FEV1 1.25 (47%)  Ratio 83 but did not blow out x 6 sec and has typical curvature -Allergy profile  03/16/17  >  Eos 0.1 /  IgE 516 RAST pos for every resp allergen    - PFT's  05/13/2017  FEV1 1.83  (69 % ) ratio 69  p no % improvement from saba p no rx prior to study with DLCO  89/91 % corrects to 121  % for alv volume  And ERV 11%  - 05/13/2017  After extensive coaching inhaler device  effectiveness =    90%   Patient failed to answer a single question asked in a straightforward manner, tending to go off on tangents or answer questions with ambiguous medical terms or diagnoses and seemed aggravated  when asked the same question more than once for clarification.   All goals of chronic asthma control met including optimal function and elimination of symptoms with minimal need for rescue therapy.  Contingencies discussed in full including contacting this office immediately if not controlling the symptoms using the rule of two's.     However, there is poor correlation between ongoing daily symptoms and pft findings > suspect much of his problem is allergic rhinitis plus obesity/ deconditioning with the asthma component controlled so rec   Wt loss (see obesity)  Ok to use symb prn here at up to 80 2bid Based on the study from NEJM  378; 20 p 1865 (2018) in pts with mild asthma it is reasonable to use low dose symbicort eg 80 2bid "prn" flare in this setting but I emphasized this was only shown with symbicort and takes advantage of the rapid onset of action but is not the same as "rescue therapy" but can be stopped once the acute symptoms have resolved and the need for rescue has been minimized (< 2 x weekly)    Refer to allergy - pulmonary f/u is prn   Each maintenance medication was reviewed in detail including most importantly the difference between maintenance and as needed and under what circumstances the prns are to be used.  Please see AVS for specific  Instructions  which are unique to this visit and I personally typed out  which were reviewed in detail in writing with the patient and a copy provided.

## 2017-05-20 NOTE — Assessment & Plan Note (Signed)
PFT's with ERV = 11% c/w effects of obesity on lung vol  Body mass index is 38.22 kg/m.  -  trending up  Lab Results  Component Value Date   TSH 1.54 03/16/2017     Contributing to gerd risk/ doe/reviewed the need and the process to achieve and maintain neg calorie balance > defer f/u primary care including intermittently monitoring thyroid status

## 2017-05-20 NOTE — Telephone Encounter (Signed)
Patient returned call

## 2017-05-23 ENCOUNTER — Other Ambulatory Visit: Payer: Self-pay | Admitting: Pediatrics

## 2017-06-11 ENCOUNTER — Other Ambulatory Visit: Payer: Self-pay | Admitting: Pediatrics

## 2017-06-11 DIAGNOSIS — I10 Essential (primary) hypertension: Secondary | ICD-10-CM

## 2017-06-29 ENCOUNTER — Ambulatory Visit (INDEPENDENT_AMBULATORY_CARE_PROVIDER_SITE_OTHER): Payer: Medicare HMO | Admitting: Pediatrics

## 2017-06-29 ENCOUNTER — Encounter: Payer: Self-pay | Admitting: Pediatrics

## 2017-06-29 DIAGNOSIS — M62838 Other muscle spasm: Secondary | ICD-10-CM

## 2017-06-29 DIAGNOSIS — E785 Hyperlipidemia, unspecified: Secondary | ICD-10-CM

## 2017-06-29 DIAGNOSIS — E1169 Type 2 diabetes mellitus with other specified complication: Secondary | ICD-10-CM

## 2017-06-29 DIAGNOSIS — J4541 Moderate persistent asthma with (acute) exacerbation: Secondary | ICD-10-CM

## 2017-06-29 LAB — BAYER DCA HB A1C WAIVED: HB A1C: 6 % (ref <7.0)

## 2017-06-29 MED ORDER — PREDNISONE 20 MG PO TABS: ORAL_TABLET | ORAL | 0 refills | Status: DC

## 2017-06-29 MED ORDER — ALBUTEROL SULFATE HFA 108 (90 BASE) MCG/ACT IN AERS: INHALATION_SPRAY | RESPIRATORY_TRACT | 3 refills | Status: DC

## 2017-06-29 MED ORDER — PRAVASTATIN SODIUM 40 MG PO TABS: 40.0000 mg | ORAL_TABLET | Freq: Every day | ORAL | 3 refills | Status: DC

## 2017-06-29 MED ORDER — AZITHROMYCIN 250 MG PO TABS: ORAL_TABLET | ORAL | 0 refills | Status: DC

## 2017-06-29 NOTE — Patient Instructions (Signed)
Gastroenterologist Dr. Laural Golden- [phone]

## 2017-06-29 NOTE — Progress Notes (Signed)
Subjective:   Patient ID: Nicholas Burns, male    DOB: 03-23-1951, 67 y.o.   MRN: [id number] CC: Follow-up (3 month) and Shortness of Breath  HPI: Nicholas Burns is a 67 y.o. male presenting for Follow-up (3 month) and Shortness of Breath  Here today with his son  Has been coughing more since last week. Coughing up white phelgm for the past week.  Feeling short of breath much of the time.  Has been using Symbicort regularly, albuterol as needed.  For the last few days he has been needing albuterol multiple times throughout the day.  Prior to getting sick he was rarely needing to use the albuterol but was still taking the Symbicort daily.  No fevers.  Has appointment to see allergy in about a month.  He says he gets bothered with his breathing when he is around thick smoke such as from cooking with grease at home.  They do not have ventilation in the kitchen.  No chest pain.  Positive FIT test: Patient aware that he needs to have a colonoscopy done.  Discussed with him and his son.  Gave phone number to call to schedule appointment with GI doctor.  Referral already in.  DM2: Blood sugar levels much improved over the last 9 months.  Taking metformin regularly.  Trying to decrease sugary food intake.  Drinking about one soda a day.  Relevant past medical, surgical, family and social history reviewed. Allergies and medications reviewed and updated. Social History   Tobacco Use  Smoking Status Never Smoker  Smokeless Tobacco Never Used   ROS: Per HPI   Objective:    BP 125/84   Pulse 72   Temp (!) 97.4 F (36.3 C) (Oral)   Resp (!) 22   Ht _0  (1.702 m)   Wt 242 lb (109.8 kg)   SpO2 95%   BMI 37.90 kg/m   Wt Readings from Last 3 Encounters:  06/29/17 242 lb (109.8 kg)  05/13/17 244 lb (110.7 kg)  03/27/17 241 lb 6.4 oz (109.5 kg)    Gen: NAD, alert, cooperative with exam, NCAT EYES: EOMI, no conjunctival injection, or no icterus ENT:  TMs pearly gray b/l, OP without  erythema LYMPH: no cervical LAD CV: NRRR, normal S1/S2, no murmur, distal pulses 2+ b/l Resp: Inspiratory and expiratory wheezes throughout lung fields, comfortable work of breathing, speaking in complete sentences.  Decreased air movement bilaterally Abd: +BS, soft, NTND.  Ext: No edema, warm Neuro: Alert and oriented, strength equal b/l UE and LE, coordination grossly normal MSK: normal muscle bulk  Assessment & Plan:  Nicholas Burns was seen today for follow-up and shortness of breath.  Diagnoses and all orders for this visit:  Morbid obesity due to excess calories ([place])  complicated by dm/ hbp, hyperlipidemia A1c 6.  Continue metformin.  Continue to try to decrease sugar intake. -     Bayer DCA Hb A1c Waived  Moderate persistent asthma with acute exacerbation Wheezing throughout today.  Treat as asthma exacerbation with prednisone and azithromycin. -     albuterol (VENTOLIN HFA) 108 (90 Base) MCG/ACT inhaler; USE 2 PUFFS EVERY 6 HOURS AS NEEDED -     predniSONE (DELTASONE) 20 MG tablet; 2 po at same time daily for 5 days -     azithromycin (ZITHROMAX) 250 MG tablet; Take 2 the first day and then one each day after.  Hyperlipidemia associated with type 2 diabetes mellitus (HCC) Stable, continue below -     pravastatin (PRAVACHOL) 40  MG tablet; Take 1 tablet (40 mg total) by mouth daily.  Muscle spasm -     BMP8+EGFR  Follow up plan: Return in about 3 months (around 09/26/2017). Assunta Found, MD [place]

## 2017-06-30 LAB — BMP8+EGFR
BUN / CREAT RATIO: 13 (ref 10–24)
BUN: 20 mg/dL (ref 8–27)
CALCIUM: 10.2 mg/dL (ref 8.6–10.2)
CHLORIDE: 100 mmol/L (ref 96–106)
CO2: 24 mmol/L (ref 20–29)
Creatinine, Ser: 1.49 mg/dL — ABNORMAL HIGH (ref 0.76–1.27)
GFR calc Af Amer: 56 mL/min/{1.73_m2} — ABNORMAL LOW (ref >59)
GFR calc non Af Amer: 48 mL/min/{1.73_m2} — ABNORMAL LOW (ref >59)
Glucose: 184 mg/dL — ABNORMAL HIGH (ref 65–99)
Potassium: 4.1 mmol/L (ref 3.5–5.2)
Sodium: 141 mmol/L (ref 134–144)

## 2017-08-04 ENCOUNTER — Ambulatory Visit: Payer: Medicare HMO | Admitting: Allergy & Immunology

## 2017-09-09 ENCOUNTER — Other Ambulatory Visit: Payer: Self-pay | Admitting: Pediatrics

## 2017-09-17 ENCOUNTER — Ambulatory Visit (INDEPENDENT_AMBULATORY_CARE_PROVIDER_SITE_OTHER): Payer: Medicare HMO | Admitting: Pediatrics

## 2017-09-17 ENCOUNTER — Encounter: Payer: Self-pay | Admitting: Pediatrics

## 2017-09-17 VITALS — BP 136/84 | HR 63 | Temp 97.1°F | Resp 18 | Ht 67.0 in | Wt 241.4 lb

## 2017-09-17 DIAGNOSIS — R195 Other fecal abnormalities: Secondary | ICD-10-CM

## 2017-09-17 DIAGNOSIS — J3089 Other allergic rhinitis: Secondary | ICD-10-CM

## 2017-09-17 DIAGNOSIS — I10 Essential (primary) hypertension: Secondary | ICD-10-CM

## 2017-09-17 DIAGNOSIS — J45901 Unspecified asthma with (acute) exacerbation: Secondary | ICD-10-CM | POA: Diagnosis not present

## 2017-09-17 MED ORDER — FLUTICASONE PROPIONATE 50 MCG/ACT NA SUSP: 2.0000 | Freq: Every day | NASAL | 6 refills | Status: DC

## 2017-09-17 MED ORDER — BUDESONIDE-FORMOTEROL FUMARATE 80-4.5 MCG/ACT IN AERO: 2.0000 | INHALATION_SPRAY | Freq: Two times a day (BID) | RESPIRATORY_TRACT | 5 refills | Status: DC

## 2017-09-17 MED ORDER — PREDNISONE 20 MG PO TABS: 40.0000 mg | ORAL_TABLET | Freq: Every day | ORAL | 0 refills | Status: AC

## 2017-09-17 MED ORDER — CETIRIZINE HCL 10 MG PO TABS: 10.0000 mg | ORAL_TABLET | Freq: Every day | ORAL | 6 refills | Status: DC

## 2017-09-17 NOTE — Progress Notes (Signed)
  Subjective:   Patient ID: Nicholas Burns, male    DOB: August 13, 1950, 67 y.o.   MRN: [id number] CC: Dizziness  HPI: Nicholas Burns is a 67 y.o. male   Mowed his lawn yesterday.  Today he is having more shortness of breath.  History of asthma, allergic to multiple seasonal allergens.  Says today he woke up not feeling very well.  Does feel more short of breath.  He takes the Symbicort as needed, he says it does not work so he has not been taking it recently.  He has been needing to use his albuterol inhaler daily for the last few weeks.  Positive fit test 6 months ago, referred to GI, has not yet followed up.  Feeling okay right now.  Says his son dropped him off and will be back in about an hour to pick them up.  Having some muscle cramps at night.  When stretching his feet feel sore.  Relevant past medical, surgical, family and social history reviewed. Allergies and medications reviewed and updated. Social History   Tobacco Use  Smoking Status Never Smoker  Smokeless Tobacco Never Used   ROS: Per HPI   Objective:    BP 136/84   Pulse 63   Temp (!) 97.1 F (36.2 C) (Oral)   Resp 18   Ht _0  (1.702 m)   Wt 241 lb 6.4 oz (109.5 kg)   SpO2 98%   BMI 37.81 kg/m   Wt Readings from Last 3 Encounters:  09/17/17 241 lb 6.4 oz (109.5 kg)  06/29/17 242 lb (109.8 kg)  05/13/17 244 lb (110.7 kg)    Gen: NAD, alert, cooperative with exam, NCAT EYES: EOMI, no conjunctival injection, or no icterus ENT:  TM normal right side, obscured by cerumen left side.   CV: NRRR, normal S1/S2, no murmur, distal pulses 2+ b/l Resp: Expiratory wheezing throughout.  Normal WOB Abd: +BS, soft, NTND.  Ext: No edema, warm Neuro: Alert and oriented, strength equal b/l UE and LE, coordination grossly normal MSK: normal muscle bulk  Assessment & Plan:  Eileen was seen today for dizziness.  Diagnoses and all orders for this visit:  Essential hypertension Continue current medicines, given muscle  cramps will check BMP -     BMP8+EGFR  Exacerbation of persistent asthma, unspecified asthma severity Start prednisone.  Restart Symbicort.  -     predniSONE (DELTASONE) 20 MG tablet; Take 2 tablets (40 mg total) by mouth daily with breakfast for 3 days. -     budesonide-formoterol (SYMBICORT) 80-4.5 MCG/ACT inhaler; Inhale 2 puffs into the lungs 2 (two) times daily.  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 -     cetirizine (ZYRTEC) 10 MG tablet; Take 1 tablet (10 mg total) by mouth daily. -     fluticasone (FLONASE) 50 MCG/ACT nasal spray; Place 2 sprays into both nostrils daily.   Follow up plan: Return in about 2 months (around 12/02/2017).  Return precautions discussed. Assunta Found, MD [place]

## 2017-09-18 LAB — BMP8+EGFR
BUN / CREAT RATIO: 9 — AB (ref 10–24)
BUN: 12 mg/dL (ref 8–27)
CO2: 27 mmol/L (ref 20–29)
Calcium: 9.6 mg/dL (ref 8.6–10.2)
Chloride: 107 mmol/L — ABNORMAL HIGH (ref 96–106)
Creatinine, Ser: 1.37 mg/dL — ABNORMAL HIGH (ref 0.76–1.27)
GFR calc non Af Amer: 53 mL/min/{1.73_m2} — ABNORMAL LOW (ref >59)
GFR, EST AFRICAN AMERICAN: 62 mL/min/{1.73_m2} (ref >59)
Glucose: 176 mg/dL — ABNORMAL HIGH (ref 65–99)
POTASSIUM: 5.3 mmol/L — AB (ref 3.5–5.2)
SODIUM: 146 mmol/L — AB (ref 134–144)

## 2017-09-18 LAB — CBC WITH DIFFERENTIAL/PLATELET
BASOS: 1 %
Basophils Absolute: 0 10*3/uL (ref 0.0–0.2)
EOS (ABSOLUTE): 0.4 10*3/uL (ref 0.0–0.4)
Eos: 9 %
Hematocrit: 41.9 % (ref 37.5–51.0)
Hemoglobin: 14 g/dL (ref 13.0–17.7)
Immature Grans (Abs): 0 10*3/uL (ref 0.0–0.1)
Immature Granulocytes: 0 %
Lymphocytes Absolute: 1.2 10*3/uL (ref 0.7–3.1)
Lymphs: 29 %
MCH: 29.9 pg (ref 26.6–33.0)
MCHC: 33.4 g/dL (ref 31.5–35.7)
MCV: 90 fL (ref 79–97)
MONOS ABS: 0.4 10*3/uL (ref 0.1–0.9)
Monocytes: 10 %
NEUTROS PCT: 51 %
Neutrophils Absolute: 2 10*3/uL (ref 1.4–7.0)
Platelets: 181 10*3/uL (ref 150–379)
RBC: 4.68 x10E6/uL (ref 4.14–5.80)
RDW: 13.7 % (ref 12.3–15.4)
WBC: 4 10*3/uL (ref 3.4–10.8)

## 2017-09-21 ENCOUNTER — Other Ambulatory Visit: Payer: Self-pay | Admitting: *Deleted

## 2017-09-21 DIAGNOSIS — E875 Hyperkalemia: Secondary | ICD-10-CM

## 2017-09-22 ENCOUNTER — Ambulatory Visit: Payer: Medicare HMO | Admitting: Allergy & Immunology

## 2017-09-28 ENCOUNTER — Ambulatory Visit: Payer: Medicare HMO | Admitting: Pediatrics

## 2017-10-19 ENCOUNTER — Ambulatory Visit: Payer: Medicare HMO | Admitting: Pediatrics

## 2017-10-22 ENCOUNTER — Encounter: Payer: Self-pay | Admitting: *Deleted

## 2017-10-23 ENCOUNTER — Encounter: Payer: Self-pay | Admitting: Pediatrics

## 2017-10-29 ENCOUNTER — Ambulatory Visit (INDEPENDENT_AMBULATORY_CARE_PROVIDER_SITE_OTHER): Payer: Medicare HMO | Admitting: Pediatrics

## 2017-10-29 ENCOUNTER — Encounter: Payer: Self-pay | Admitting: Pediatrics

## 2017-10-29 ENCOUNTER — Ambulatory Visit: Payer: Medicare HMO | Admitting: Pediatrics

## 2017-10-29 VITALS — BP 137/88 | HR 66 | Temp 97.6°F | Ht 67.0 in | Wt 243.0 lb

## 2017-10-29 DIAGNOSIS — M722 Plantar fascial fibromatosis: Secondary | ICD-10-CM | POA: Diagnosis not present

## 2017-10-29 DIAGNOSIS — L309 Dermatitis, unspecified: Secondary | ICD-10-CM | POA: Diagnosis not present

## 2017-10-29 MED ORDER — TRIAMCINOLONE ACETONIDE 0.025 % EX OINT: 1.0000 "application " | TOPICAL_OINTMENT | Freq: Two times a day (BID) | CUTANEOUS | 0 refills | Status: DC

## 2017-10-29 NOTE — Progress Notes (Signed)
  Subjective:   Patient ID: Nicholas Burns, male    DOB: 03-28-1951, 67 y.o.   MRN: [id number] CC: Foot Pain (Bilateral, sharp with walking)  HPI: Nicholas Burns is a 67 y.o. male   With walking he has been having pain in both of his feet, at the base of his heel along his arch.  Recently switched to different shoes.  Wears primarily tennis shoes.  Worse after he has been on his feet for some period of time.  Has itchy areas bilateral ankles on his left shin that he has been scratching at.  Has appt to see GI for positive FIT test upcoming.  Relevant past medical, surgical, family and social history reviewed. Allergies and medications reviewed and updated. Social History   Tobacco Use  Smoking Status Never Smoker  Smokeless Tobacco Never Used   ROS: Per HPI   Objective:    BP 137/88   Pulse 66   Temp 97.6 F (36.4 C) (Oral)   Ht 5\' 7"  (1.702 m)   Wt 243 lb (110.2 kg)   BMI 38.06 kg/m   Wt Readings from Last 3 Encounters:  10/29/17 243 lb (110.2 kg)  09/17/17 241 lb 6.4 oz (109.5 kg)  06/29/17 242 lb (109.8 kg)    Gen: NAD, alert, cooperative with exam, NCAT EYES: EOMI, no conjunctival injection, or no icterus CV: NRRR, normal S1/S2, no murmur, distal pulses 2+ b/l Resp: CTABL, no wheezes, normal WOB Abd: +BS, soft, NTND.  Ext: No edema, warm Neuro: Alert and oriented MSK: ttp insertion site at heel of plantar fascia b/l feet. Arches present non-weight bearing, some collapse of arches with weight bearing. Skin: L shin with 5cm patch of dry scaly skin. No erythema. B/l ankles with hyperpigmented papules and excoriations  Assessment & Plan:  Nicholas Burns was seen today for foot pain.  Diagnoses and all orders for this visit:  Plantar fasciitis Non-supportive shoes with no arch support. rec trying otc inserts in tennis shoes to improve support for feet. Discussed gentle stretches.  Eczema, unspecified type Trial of below, if not improving let me know -     triamcinolone  (KENALOG) 0.025 % ointment; Apply 1 application topically 2 (two) times daily.   Follow up plan: Return in about 1 month (around 11/26/2017). Assunta Found, MD [place]

## 2017-10-29 NOTE — Patient Instructions (Addendum)
Call GI to set up appt: Phone: [phone]  Wear shoes with good arch support Plantar Fasciitis Plantar fasciitis is a painful foot condition that affects the heel. It occurs when the band of tissue that connects the toes to the heel bone (plantar fascia) becomes irritated. This can happen after exercising too much or doing other repetitive activities (overuse injury). The pain from plantar fasciitis can range from mild irritation to severe pain that makes it difficult for you to walk or move. The pain is usually worse in the morning or after you have been sitting or lying down for a while. What are the causes? This condition may be caused by:  Standing for long periods of time.  Wearing shoes that do not fit.  Doing high-impact activities, including running, aerobics, and ballet.  Being overweight.  Having an abnormal way of walking (gait).  Having tight calf muscles.  Having high arches in your feet.  Starting a new athletic activity.  What are the signs or symptoms? The main symptom of this condition is heel pain. Other symptoms include:  Pain that gets worse after activity or exercise.  Pain that is worse in the morning or after resting.  Pain that goes away after you walk for a few minutes.  How is this diagnosed? This condition may be diagnosed based on your signs and symptoms. Your health care provider will also do a physical exam to check for:  A tender area on the bottom of your foot.  A high arch in your foot.  Pain when you move your foot.  Difficulty moving your foot.  You may also need to have imaging studies to confirm the diagnosis. These can include:  X-rays.  Ultrasound.  MRI.  How is this treated? Treatment for plantar fasciitis depends on the severity of the condition. Your treatment may include:  Rest, ice, and over-the-counter pain medicines to manage your pain.  Exercises to stretch your calves and your plantar fascia.  A splint that  holds your foot in a stretched, upward position while you sleep (night splint).  Physical therapy to relieve symptoms and prevent problems in the future.  Cortisone injections to relieve severe pain.  Extracorporeal shock wave therapy (ESWT) to stimulate damaged plantar fascia with electrical impulses. It is often used as a last resort before surgery.  Surgery, if other treatments have not worked after 12 months.  Follow these instructions at home:  Take medicines only as directed by your health care provider.  Avoid activities that cause pain.  Roll the bottom of your foot over a bag of ice or a bottle of cold water. Do this for 20 minutes, 3-4 times a day.  Perform simple stretches as directed by your health care provider.  Try wearing athletic shoes with air-sole or gel-sole cushions or soft shoe inserts.  Wear a night splint while sleeping, if directed by your health care provider.  Keep all follow-up appointments with your health care provider. How is this prevented?  Do not perform exercises or activities that cause heel pain.  Consider finding low-impact activities if you continue to have problems.  Lose weight if you need to. The best way to prevent plantar fasciitis is to avoid the activities that aggravate your plantar fascia. Contact a health care provider if:  Your symptoms do not go away after treatment with home care measures.  Your pain gets worse.  Your pain affects your ability to move or do your daily activities. This information is  not intended to replace advice given to you by your health care provider. Make sure you discuss any questions you have with your health care provider. Document Released: 01/28/2001 Document Revised: 10/08/2015 Document Reviewed: 03/15/2014 Elsevier Interactive Patient Education  [employer].

## 2017-11-05 ENCOUNTER — Encounter: Payer: Self-pay | Admitting: Gastroenterology

## 2017-11-06 ENCOUNTER — Other Ambulatory Visit: Payer: Self-pay | Admitting: Pediatrics

## 2017-11-27 ENCOUNTER — Ambulatory Visit: Payer: Medicare HMO | Admitting: Pediatrics

## 2017-11-30 ENCOUNTER — Ambulatory Visit (INDEPENDENT_AMBULATORY_CARE_PROVIDER_SITE_OTHER): Payer: Medicare HMO | Admitting: Pediatrics

## 2017-11-30 ENCOUNTER — Encounter: Payer: Self-pay | Admitting: Pediatrics

## 2017-11-30 VITALS — BP 131/80 | HR 61 | Temp 97.0°F | Ht 67.0 in | Wt 244.2 lb

## 2017-11-30 DIAGNOSIS — I10 Essential (primary) hypertension: Secondary | ICD-10-CM | POA: Diagnosis not present

## 2017-11-30 DIAGNOSIS — Z125 Encounter for screening for malignant neoplasm of prostate: Secondary | ICD-10-CM

## 2017-11-30 DIAGNOSIS — N183 Chronic kidney disease, stage 3 unspecified: Secondary | ICD-10-CM

## 2017-11-30 DIAGNOSIS — M79671 Pain in right foot: Secondary | ICD-10-CM | POA: Diagnosis not present

## 2017-11-30 DIAGNOSIS — E785 Hyperlipidemia, unspecified: Secondary | ICD-10-CM

## 2017-11-30 DIAGNOSIS — E119 Type 2 diabetes mellitus without complications: Secondary | ICD-10-CM | POA: Diagnosis not present

## 2017-11-30 DIAGNOSIS — J4540 Moderate persistent asthma, uncomplicated: Secondary | ICD-10-CM | POA: Diagnosis not present

## 2017-11-30 DIAGNOSIS — M79672 Pain in left foot: Secondary | ICD-10-CM

## 2017-11-30 DIAGNOSIS — E1169 Type 2 diabetes mellitus with other specified complication: Secondary | ICD-10-CM | POA: Diagnosis not present

## 2017-11-30 LAB — BAYER DCA HB A1C WAIVED: HB A1C: 6.1 % (ref <7.0)

## 2017-11-30 NOTE — Progress Notes (Signed)
Subjective:   Patient ID: Nicholas Burns, male    DOB: 1951-04-17, 67 y.o.   MRN: [id number] CC: Medical Management of Chronic Issues  HPI: Nicholas Burns is a 67 y.o. male   Asthma: Being in the heat he sometimes feels like he has a harder time breathing.  Has had to use albuterol about 3 times in the last couple weeks.  Taking Simcor regularly.  No wheezing.  Positive fit test.  Has upcoming appoint with GI.  Diabetes: Trying to avoid sugary foods.  Mostly eating salads now.  He does drink sodas daily.  Is interested in cutting back, has been hard.  Hypertension: Taking his medicines regularly, no chest pressure or headaches.  Hyperlipidemia: Tolerating statin.  Foot pain: Points to the back of his heel, over his arch with right foot pain bothers him the most.  He wonders if it could be from his diabetes.  He does not have any decreased sensation in his feet from the diabetes.  The feet bother him mostly when he is up walking.  He is on his feet most of the day, caring for his mother.  He was on his feet 12 hours a day when working, he retired 2 years ago.  Then the foot pain did not bother him.  The pain is been bothering him more over the last couple months.  He did try wearing more supportive shoes with minimal improvement in foot pain.  Relevant past medical, surgical, family and social history reviewed. Allergies and medications reviewed and updated. Social History   Tobacco Use  Smoking Status Never Smoker  Smokeless Tobacco Never Used   ROS: Per HPI   Objective:    BP 131/80   Pulse 61   Temp (!) 97 F (36.1 C) (Oral)   Ht '5\' 7"'  (1.702 m)   Wt 244 lb 3.2 oz (110.8 kg)   BMI 38.25 kg/m   Wt Readings from Last 3 Encounters:  11/30/17 244 lb 3.2 oz (110.8 kg)  10/29/17 243 lb (110.2 kg)  09/17/17 241 lb 6.4 oz (109.5 kg)    Gen: NAD, alert, cooperative with exam, NCAT EYES: EOMI, no conjunctival injection, or no icterus CV: NRRR, normal S1/S2, no murmur, distal  pulses 2+ b/l Resp: Moving air well, no wheezes or crackles.  Fairly prolonged expiratory phase.  normal WOB Abd: +BS, soft, NTND. no guarding or organomegaly Ext: No edema, warm Neuro: Alert and oriented, strength equal b/l UE and LE, coordination grossly normal, sensation intact bilateral feet. MSK: normal muscle bulk  Assessment & Plan:  Nicholas Burns was seen today for medical management of chronic issues.  Diagnoses and all orders for this visit:  Essential hypertension Stable, continue current medicines -     BMP8+EGFR  Morbid obesity due to excess calories (HCC)  complicated by dm/ hbp, hyperlipidemia Continue to encourage healthy lifestyle, decrease soda intake.  Patient open to it.  He has been trying to increase his physical activity.  Hyperlipidemia associated with type 2 diabetes mellitus (HCC) Stable, continue statin  Pain in both feet Continue to wear supportive shoes. -     Ambulatory referral to Podiatry  Diabetes mellitus without complication ([place]) -     Bayer DCA Hb A1c Waived  CKD (chronic kidney disease) stage 3, GFR 30-59 ml/min (HCC)  Moderate persistent asthma without complication Stable, continue Symbicort.  Screening for prostate cancer -     PSA, total and free   Follow up plan: Return in about 3 months (around 03/02/2018).  Assunta Found, MD [place]

## 2017-12-01 LAB — BMP8+EGFR
BUN / CREAT RATIO: 9 — AB (ref 10–24)
BUN: 11 mg/dL (ref 8–27)
CALCIUM: 9.9 mg/dL (ref 8.6–10.2)
CHLORIDE: 100 mmol/L (ref 96–106)
CO2: 27 mmol/L (ref 20–29)
Creatinine, Ser: 1.25 mg/dL (ref 0.76–1.27)
GFR calc non Af Amer: 60 mL/min/{1.73_m2} (ref >59)
GFR, EST AFRICAN AMERICAN: 69 mL/min/{1.73_m2} (ref >59)
GLUCOSE: 99 mg/dL (ref 65–99)
POTASSIUM: 4.7 mmol/L (ref 3.5–5.2)
Sodium: 138 mmol/L (ref 134–144)

## 2017-12-01 LAB — PSA, TOTAL AND FREE
PSA FREE: 0.88 ng/mL
PSA, Free Pct: 19.6 %
Prostate Specific Ag, Serum: 4.5 ng/mL — ABNORMAL HIGH (ref 0.0–4.0)

## 2017-12-03 ENCOUNTER — Other Ambulatory Visit: Payer: Self-pay | Admitting: Pediatrics

## 2017-12-03 DIAGNOSIS — R972 Elevated prostate specific antigen [PSA]: Secondary | ICD-10-CM

## 2017-12-08 ENCOUNTER — Encounter: Payer: Self-pay | Admitting: Pediatrics

## 2017-12-17 ENCOUNTER — Ambulatory Visit (AMBULATORY_SURGERY_CENTER): Payer: Self-pay | Admitting: *Deleted

## 2017-12-17 VITALS — Ht 69.0 in | Wt 242.8 lb

## 2017-12-17 DIAGNOSIS — R195 Other fecal abnormalities: Secondary | ICD-10-CM

## 2017-12-17 NOTE — Progress Notes (Signed)
No egg or soy allergy known to patient  No issues with past sedation with any surgeries or procedures, no intubation problems  No diet pills per patient No home 02 use per patient  No blood thinners per patient  Pt denies issues with constipation maybe 1-2 x a month  No A fib or A flutter  EMMI video sent to pt's e mail  Son in [place] with pt today

## 2017-12-28 ENCOUNTER — Encounter: Payer: Self-pay | Admitting: Gastroenterology

## 2018-01-11 ENCOUNTER — Encounter: Payer: Self-pay | Admitting: Gastroenterology

## 2018-01-11 ENCOUNTER — Other Ambulatory Visit: Payer: Self-pay | Admitting: Pediatrics

## 2018-01-11 ENCOUNTER — Ambulatory Visit (AMBULATORY_SURGERY_CENTER): Payer: Medicare HMO | Admitting: Gastroenterology

## 2018-01-11 VITALS — BP 139/77 | HR 52 | Temp 96.8°F | Resp 14 | Ht 69.0 in | Wt 242.0 lb

## 2018-01-11 DIAGNOSIS — D120 Benign neoplasm of cecum: Secondary | ICD-10-CM

## 2018-01-11 DIAGNOSIS — D122 Benign neoplasm of ascending colon: Secondary | ICD-10-CM | POA: Diagnosis not present

## 2018-01-11 DIAGNOSIS — R195 Other fecal abnormalities: Secondary | ICD-10-CM | POA: Diagnosis not present

## 2018-01-11 DIAGNOSIS — D123 Benign neoplasm of transverse colon: Secondary | ICD-10-CM | POA: Diagnosis not present

## 2018-01-11 DIAGNOSIS — D124 Benign neoplasm of descending colon: Secondary | ICD-10-CM

## 2018-01-11 MED ORDER — SODIUM CHLORIDE 0.9 % IV SOLN: 500.0000 mL | Freq: Once | INTRAVENOUS | Status: DC

## 2018-01-11 NOTE — Progress Notes (Signed)
A/ox3, pleased with MAC, report to RN 

## 2018-01-11 NOTE — Progress Notes (Signed)
Called to room to assist during endoscopic procedure.  Patient ID and intended procedure confirmed with present staff. Received instructions for my participation in the procedure from the performing physician.  

## 2018-01-11 NOTE — Progress Notes (Signed)
Pt's states no medical or surgical changes since previsit or office visit. 

## 2018-01-11 NOTE — Patient Instructions (Signed)
YOU HAD AN ENDOSCOPIC PROCEDURE TODAY AT [facility]:   Refer to the procedure report that was given to you for any specific questions about what was found during the examination.  If the procedure report does not answer your questions, please call your gastroenterologist to clarify.  If you requested that your care partner not be given the details of your procedure findings, then the procedure report has been included in a sealed envelope for you to review at your convenience later.  YOU SHOULD EXPECT: Some feelings of bloating in the abdomen. Passage of more gas than usual.  Walking can help get rid of the air that was put into your GI tract during the procedure and reduce the bloating. If you had a lower endoscopy (such as a colonoscopy or flexible sigmoidoscopy) you may notice spotting of blood in your stool or on the toilet paper. If you underwent a bowel prep for your procedure, you may not have a normal bowel movement for a few days.  Please Note:  You might notice some irritation and congestion in your nose or some drainage.  This is from the oxygen used during your procedure.  There is no need for concern and it should clear up in a day or so.  SYMPTOMS TO REPORT IMMEDIATELY:   Following lower endoscopy (colonoscopy or flexible sigmoidoscopy):  Excessive amounts of blood in the stool  Significant tenderness or worsening of abdominal pains  Swelling of the abdomen that is new, acute  Fever of 100F or higher   For urgent or emergent issues, a gastroenterologist can be reached at any hour by calling [phone].   DIET:  We do recommend a small meal at first, but then you may proceed to your regular diet.  Drink plenty of fluids but you should avoid alcoholic beverages for 24 hours. Try to increase the fiber in your diet, and drink plenty of water.  ACTIVITY:  You should plan to take it easy for the rest of today and you should NOT DRIVE or use heavy machinery until  tomorrow (because of the sedation medicines used during the test).    FOLLOW UP: Our staff will call the number listed on your records the next business day following your procedure to check on you and address any questions or concerns that you may have regarding the information given to you following your procedure. If we do not reach you, we will leave a message.  However, if you are feeling well and you are not experiencing any problems, there is no need to return our call.  We will assume that you have returned to your regular daily activities without incident.  You will need another colonoscopy in 1 year due to the amount of polyps.  If any biopsies were taken you will be contacted by phone or by letter within the next 1-3 weeks.  Please call us at [phone] if you have not heard about the biopsies in 3 weeks.  NO NSAIDS: ASPIRIN, ALEVE,IBUPROFEN x 2 weeks to prevent bleeding.  SIGNATURES/CONFIDENTIALITY: You and/or your care partner have signed paperwork which will be entered into your electronic medical record.  These signatures attest to the fact that that the information above on your After Visit Summary has been reviewed and is understood.  Full responsibility of the confidentiality of this discharge information lies with you and/or your care-partner.

## 2018-01-11 NOTE — Op Note (Signed)
[facility] Patient Name: Nicholas Burns Procedure Date: 01/11/2018 8:37 AM MRN: [id number] Endoscopist: Mauri Pole , MD Age: 67 Referring MD:  Date of Birth: [birth year] Gender: Male Account #: [id number] Procedure:                Colonoscopy Indications:              Screening for colorectal malignant neoplasm Medicines:                Monitored Anesthesia Care Procedure:                Pre-Anesthesia Assessment:                           - Prior to the procedure, a History and Physical                            was performed, and patient medications and                            allergies were reviewed. The patient's tolerance of                            previous anesthesia was also reviewed. The risks                            and benefits of the procedure and the sedation                            options and risks were discussed with the patient.                            All questions were answered, and informed consent                            was obtained. Prior Anticoagulants: The patient has                            taken no previous anticoagulant or antiplatelet                            agents. ASA Grade Assessment: II - A patient with                            mild systemic disease. After reviewing the risks                            and benefits, the patient was deemed in                            satisfactory condition to undergo the procedure.                           After obtaining informed consent, the colonoscope  was passed under direct vision. Throughout the                            procedure, the patient's blood pressure, pulse, and                            oxygen saturations were monitored continuously. The                            Colonoscope was introduced through the anus and                            advanced to the the terminal ileum, with                            identification of the  appendiceal orifice and IC                            valve. The colonoscopy was performed without                            difficulty. The patient tolerated the procedure                            well. The quality of the bowel preparation was                            adequate. The terminal ileum, ileocecal valve,                            appendiceal orifice, and rectum were photographed. Scope In: 8:45:03 AM Scope Out: 9:27:57 AM Scope Withdrawal Time: 0 hours 35 minutes 4 seconds  Total Procedure Duration: 0 hours 42 minutes 54 seconds  Findings:                 The perianal and digital rectal examinations were                            normal.                           Six sessile polyps were found in the transverse                            colon and cecum. The polyps were 4 to 7 mm in size.                            These polyps were removed with a cold snare.                            Resection and retrieval were complete.                           Four sessile polyps were found in the transverse  colon, ascending colon and cecum. The polyps were 1                            to 3 mm in size. These polyps were removed with a                            cold biopsy forceps. Resection and retrieval were                            complete.                           Three pedunculated polyps were found in the                            descending colon and transverse colon. The polyps                            were 9 to 15 mm in size. These polyps were removed                            with a hot snare. Resection and retrieval were                            complete.                           A 5 mm polyp was found in the descending colon. The                            polyp was pedunculated. The polyp was removed with                            a hot snare. Resection and retrieval were complete.                            To prevent bleeding  after the polypectomy, one                            hemostatic clip was successfully placed (MR                            conditional). There was no bleeding at the end of                            the procedure.                           Non-bleeding internal hemorrhoids were found during                            retroflexion. The hemorrhoids were small.  Scattered small and large-mouthed diverticula were                            found in the sigmoid colon, descending colon,                            transverse colon and ascending colon. Complications:            No immediate complications. Estimated Blood Loss:     Estimated blood loss was minimal. Impression:               - Six 4 to 7 mm polyps in the transverse colon and                            in the cecum, removed with a cold snare. Resected                            and retrieved.                           - Four 1 to 3 mm polyps in the transverse colon, in                            the ascending colon and in the cecum, removed with                            a cold biopsy forceps. Resected and retrieved.                           - Three 9 to 15 mm polyps in the descending colon                            and in the transverse colon, removed with a hot                            snare. Resected and retrieved.                           - One 5 mm polyp in the descending colon, removed                            with a hot snare. Resected and retrieved. Clip (MR                            conditional) was placed.                           - Non-bleeding internal hemorrhoids.                           - Diverticulosis in the sigmoid colon, in the                            descending colon, in the  transverse colon and in                            the ascending colon. Recommendation:           - Patient has a contact number available for                            emergencies. The signs and  symptoms of potential                            delayed complications were discussed with the                            patient. Return to normal activities tomorrow.                            Written discharge instructions were provided to the                            patient.                           - Resume previous diet.                           - Continue present medications.                           - Await pathology results.                           - Repeat colonoscopy in 1 year for surveillance                            based on pathology results. Mauri Pole, MD 01/11/2018 9:37:33 AM This report has been signed electronically.

## 2018-01-12 ENCOUNTER — Telehealth: Payer: Self-pay | Admitting: *Deleted

## 2018-01-12 ENCOUNTER — Encounter: Payer: Self-pay | Admitting: Gastroenterology

## 2018-01-12 NOTE — Telephone Encounter (Signed)
  Follow up Call-  Call back number 01/11/2018  Post procedure Call Back phone  # [phone]  Permission to leave phone message Yes  Some recent data might be hidden     Patient questions:  Do you have a fever, pain , or abdominal swelling? No. Pain Score  0 *  Have you tolerated food without any problems? Yes.    Have you been able to return to your normal activities? Yes.    Do you have any questions about your discharge instructions: Diet   No. Medications  No. Follow up visit  No.  Do you have questions or concerns about your Care? No.  Actions: * If pain score is 4 or above: No action needed, pain <4.

## 2018-01-13 DIAGNOSIS — R972 Elevated prostate specific antigen [PSA]: Secondary | ICD-10-CM | POA: Diagnosis not present

## 2018-01-15 DIAGNOSIS — R69 Illness, unspecified: Secondary | ICD-10-CM | POA: Diagnosis not present

## 2018-01-20 ENCOUNTER — Encounter: Payer: Self-pay | Admitting: Podiatry

## 2018-01-20 ENCOUNTER — Ambulatory Visit: Payer: Medicare HMO | Admitting: Podiatry

## 2018-01-20 ENCOUNTER — Other Ambulatory Visit: Payer: Self-pay | Admitting: Pediatrics

## 2018-01-20 VITALS — BP 167/96 | HR 55

## 2018-01-20 DIAGNOSIS — B351 Tinea unguium: Secondary | ICD-10-CM | POA: Diagnosis not present

## 2018-01-20 DIAGNOSIS — J4541 Moderate persistent asthma with (acute) exacerbation: Secondary | ICD-10-CM

## 2018-01-20 DIAGNOSIS — E119 Type 2 diabetes mellitus without complications: Secondary | ICD-10-CM | POA: Diagnosis not present

## 2018-01-20 DIAGNOSIS — M79674 Pain in right toe(s): Secondary | ICD-10-CM

## 2018-01-20 DIAGNOSIS — M79675 Pain in left toe(s): Secondary | ICD-10-CM

## 2018-01-20 NOTE — Progress Notes (Signed)
This patient presents to the office with chief complaint of long thick nails and diabetic feet.  This patient  says there  is   pain and discomfort in his arch.  This patient says there are long thick painful nails.  These nails are painful walking and wearing shoes.  Patient has no history of infection or drainage from both feet.  Patient is unable to  self treat his own nails . This patient presents  to the office today for treatment of the  long nails and a foot evaluation due to history of  diabetes.  General Appearance  Alert, conversant and in no acute stress.  Vascular  Dorsalis pedis and posterior tibial  pulses are palpable  bilaterally.  Capillary return is within normal limits  bilaterally. Temperature is within normal limits  bilaterally.  Neurologic  Senn-Weinstein monofilament wire test within normal limits  bilaterally. Muscle power within normal limits bilaterally.  Nails Thick disfigured discolored nails with subungual debris  from hallux to fifth toes bilaterally. No evidence of bacterial infection or drainage bilaterally.  Orthopedic  No limitations of motion of motion feet .  No crepitus or effusions noted.  No bony pathology or digital deformities noted.  Skin  normotropic skin with no porokeratosis noted bilaterally.  No signs of infections or ulcers noted.     Onychomycosis  Diabetes with no foot complications  IE  Debride nails x 10.  A diabetic foot exam was performed and there is no evidence of any vascular or neurologic pathology.   RTC 3 months.   Gardiner Barefoot DPM

## 2018-01-21 ENCOUNTER — Encounter: Payer: Self-pay | Admitting: Gastroenterology

## 2018-02-19 ENCOUNTER — Ambulatory Visit (INDEPENDENT_AMBULATORY_CARE_PROVIDER_SITE_OTHER): Payer: Medicare HMO | Admitting: Pediatrics

## 2018-02-19 ENCOUNTER — Encounter: Payer: Self-pay | Admitting: Pediatrics

## 2018-02-19 VITALS — BP 133/81 | HR 62 | Temp 97.8°F | Ht 69.0 in | Wt 247.4 lb

## 2018-02-19 DIAGNOSIS — N183 Chronic kidney disease, stage 3 unspecified: Secondary | ICD-10-CM

## 2018-02-19 DIAGNOSIS — E119 Type 2 diabetes mellitus without complications: Secondary | ICD-10-CM

## 2018-02-19 DIAGNOSIS — J4540 Moderate persistent asthma, uncomplicated: Secondary | ICD-10-CM | POA: Diagnosis not present

## 2018-02-19 DIAGNOSIS — I10 Essential (primary) hypertension: Secondary | ICD-10-CM | POA: Diagnosis not present

## 2018-02-19 LAB — BAYER DCA HB A1C WAIVED: HB A1C: 6.1 % (ref <7.0)

## 2018-02-19 NOTE — Progress Notes (Signed)
  Subjective:   Patient ID: Nicholas Burns, male    DOB: [birth year], 67 y.o.   MRN: [id number] CC: Joint Pain (Arthritis attack)  HPI: Nicholas Burns is a 67 y.o. male   About a week ago was sleeping, woke up with severe pain all over, rolled over with some improvement in pain. Happened once before, improved with goody powder. Ibuprofen has helped in the past. This happens sometimes during day when working too. At night pain worse if sleeping on his R side. No specific joints that he is regularly bothered by but NSAIDs do seem to help. Sometimes walking helps.   Elevated PSA: not yet followed up with urology.   Asthma: taking symbicort twice a day. Sometimes needs albuterol when he is walking up hills  DM2: avoiding sugary foods mostly. Occasional candy bar.  Foot pain has improved some since he started soaking his feet regularly and improved foot wear.  Relevant past medical, surgical, family and social history reviewed. Allergies and medications reviewed and updated. Social History   Tobacco Use  Smoking Status Never Smoker  Smokeless Tobacco Never Used   ROS: Per HPI   Objective:    BP 133/81   Pulse 62   Temp 97.8 F (36.6 C) (Oral)   Ht 5' 9" (1.753 m)   Wt 247 lb 6.4 oz (112.2 kg)   BMI 36.53 kg/m   Wt Readings from Last 3 Encounters:  02/19/18 247 lb 6.4 oz (112.2 kg)  01/11/18 242 lb (109.8 kg)  12/17/17 242 lb 12.8 oz (110.1 kg)    Gen: NAD, alert, cooperative with exam, NCAT EYES: EOMI, no conjunctival injection, or no icterus ENT:  OP without erythema LYMPH: no cervical LAD CV: NRRR, normal S1/S2, no murmur, distal pulses 2+ b/l Resp: CTABL, no wheezes, normal WOB Abd: +BS, soft, NTND. Ext: No edema, warm Neuro: Alert and oriented MSK: normal muscle bulk  Assessment & Plan:  Nicholas Burns was seen today for joint pain.  Diagnoses and all orders for this visit:  CKD (chronic kidney disease) stage 3, GFR 30-59 ml/min (HCC) Stable, repeat below -      CMP14+EGFR  Diabetes mellitus without complication ([place]) Due for repeat A1c, cont dietary changes -     Bayer DCA Hb A1c Waived  Essential hypertension Stable, cont current meds  Moderate persistent asthma without complication Stable, cont controller medicine twice a day  Follow up plan: Return in about 3 months (around 05/22/2018). Assunta Found, MD [place]

## 2018-02-19 NOTE — Patient Instructions (Signed)
Elevated prostate blood test: referral in to [facility]. Call [phone] to set up appointment  OK to take 200mg  of ibuprofen up to twice a day when needed for arthritis pain. If taking it regularly let me know. Avoid use of goody powders. Ibuprofen, aleve, naproxen and goody powders are all hard on your kidneys and avoiding use of both as much as possible will help keep your kidneys healthier.   Tylenol/acetaminophen is fine to take daily. Does not have affects on your kidneys.

## 2018-02-20 LAB — CMP14+EGFR
A/G RATIO: 1.6 (ref 1.2–2.2)
ALT: 18 IU/L (ref 0–44)
AST: 27 IU/L (ref 0–40)
Albumin: 4.5 g/dL (ref 3.6–4.8)
Alkaline Phosphatase: 56 IU/L (ref 39–117)
BILIRUBIN TOTAL: 0.7 mg/dL (ref 0.0–1.2)
BUN/Creatinine Ratio: 11 (ref 10–24)
BUN: 15 mg/dL (ref 8–27)
CHLORIDE: 102 mmol/L (ref 96–106)
CO2: 23 mmol/L (ref 20–29)
Calcium: 9.9 mg/dL (ref 8.6–10.2)
Creatinine, Ser: 1.34 mg/dL — ABNORMAL HIGH (ref 0.76–1.27)
GFR calc non Af Amer: 55 mL/min/{1.73_m2} — ABNORMAL LOW (ref >59)
GFR, EST AFRICAN AMERICAN: 63 mL/min/{1.73_m2} (ref >59)
GLOBULIN, TOTAL: 2.8 g/dL (ref 1.5–4.5)
Glucose: 81 mg/dL (ref 65–99)
POTASSIUM: 4.2 mmol/L (ref 3.5–5.2)
SODIUM: 143 mmol/L (ref 134–144)
TOTAL PROTEIN: 7.3 g/dL (ref 6.0–8.5)

## 2018-03-04 ENCOUNTER — Ambulatory Visit: Payer: Medicare HMO | Admitting: Pediatrics

## 2018-03-09 ENCOUNTER — Ambulatory Visit: Payer: Medicare HMO | Admitting: Allergy & Immunology

## 2018-03-10 ENCOUNTER — Encounter: Payer: Self-pay | Admitting: Pediatrics

## 2018-03-19 ENCOUNTER — Ambulatory Visit: Payer: Medicare HMO | Admitting: Allergy & Immunology

## 2018-03-19 ENCOUNTER — Encounter: Payer: Self-pay | Admitting: Allergy & Immunology

## 2018-03-19 VITALS — BP 112/80 | HR 63 | Temp 98.0°F | Resp 18 | Ht 67.72 in | Wt 246.2 lb

## 2018-03-19 DIAGNOSIS — L2084 Intrinsic (allergic) eczema: Secondary | ICD-10-CM

## 2018-03-19 DIAGNOSIS — J3089 Other allergic rhinitis: Secondary | ICD-10-CM | POA: Diagnosis not present

## 2018-03-19 DIAGNOSIS — J302 Other seasonal allergic rhinitis: Secondary | ICD-10-CM

## 2018-03-19 DIAGNOSIS — J4540 Moderate persistent asthma, uncomplicated: Secondary | ICD-10-CM | POA: Diagnosis not present

## 2018-03-19 DIAGNOSIS — J4520 Mild intermittent asthma, uncomplicated: Secondary | ICD-10-CM | POA: Diagnosis not present

## 2018-03-19 MED ORDER — BUDESONIDE-FORMOTEROL FUMARATE 160-4.5 MCG/ACT IN AERO: 2.0000 | INHALATION_SPRAY | Freq: Two times a day (BID) | RESPIRATORY_TRACT | 5 refills | Status: DC

## 2018-03-19 MED ORDER — TRIAMCINOLONE ACETONIDE 0.025 % EX OINT: TOPICAL_OINTMENT | Freq: Two times a day (BID) | CUTANEOUS | 1 refills | Status: DC

## 2018-03-19 MED ORDER — EPINEPHRINE 0.3 MG/0.3ML IJ SOAJ: 0.3000 mg | Freq: Once | INTRAMUSCULAR | 2 refills | Status: AC

## 2018-03-19 MED ORDER — ALBUTEROL SULFATE (2.5 MG/3ML) 0.083% IN NEBU: 2.5000 mg | INHALATION_SOLUTION | Freq: Four times a day (QID) | RESPIRATORY_TRACT | 1 refills | Status: DC | PRN

## 2018-03-19 MED ORDER — ALBUTEROL SULFATE HFA 108 (90 BASE) MCG/ACT IN AERS: INHALATION_SPRAY | RESPIRATORY_TRACT | 1 refills | Status: DC

## 2018-03-19 MED ORDER — AZELASTINE HCL 0.1 % NA SOLN: NASAL | 5 refills | Status: DC

## 2018-03-19 NOTE — Patient Instructions (Signed)
1. Moderate persistent asthma, uncomplicated - Lung testing looked low today with values in the 58% range or so, but there was an improvement with the nebulizer treatment. - I think we can do better with regards to your asthma management. - We are going to increase you to Symbicort 160/4.5 two puffs twice daily. - We are also going to add on Fasenra (anti-eosinophil injectable medication) which can increase your lung volumes and help you breathe better overall. - Spacer sample and demonstration provided. - Daily controller medication(s): Fasenra monthly x 3 doses (then every 8 weeks thereafter) and Symbicort 160/4.70mcg two puffs twice daily with spacer - Prior to physical activity: ProAir 2 puffs 10-15 minutes before physical activity. - Rescue medications: ProAir 4 puffs every 4-6 hours as needed - Asthma control goals:  * Full participation in all desired activities (may need albuterol before activity) * Albuterol use two time or less a week on average (not counting use with activity) * Cough interfering with sleep two time or less a month * Oral steroids no more than once a year * No hospitalizations  2. Seasonal and perennial allergic rhinitis - Testing today showed: ragweed, mixed feath, mouse, trees, weeds, grasses, indoor molds, outdoor molds, dust mites, cat, dog and cockroach - Avoidance measures provided. - Continue with: Zyrtec (cetirizine) 10mg  tablet once daily and Flonase (fluticasone) two sprays per nostril daily - Start taking: Astelin (azelastine) 2 sprays per nostril 1-2 times daily as needed - You can use an extra dose of the antihistamine, if needed, for breakthrough symptoms.  - Consider nasal saline rinses 1-2 times daily to remove allergens from the nasal cavities as well as help with mucous clearance (this is especially helpful to do before the nasal sprays are given) - Allergy shot consent signed today. - Allergy shots "re-train" and "reset" the immune system to  ignore environmental allergens and decrease the resulting immune response to those allergens (sneezing, itchy watery eyes, runny nose, nasal congestion, etc).    - Allergy shots improve symptoms in 75-85% of patients.  - Make an appointment for two weeks for the first injection.   3. Intrinsic atopic dermatitis - Continue with triamcinolone 0.1% ointment twice daily as needed to the worst areas. - Continue with moisturizing twice daily.  4. Return in about 5 days (around 03/24/2018) for [facility] and two weeks for ALLERGY SHOT and four weeks for OFFICE VISIT.    Please inform us of any Emergency Department visits, hospitalizations, or changes in symptoms. Call us before going to the ED for breathing or allergy symptoms since we might be able to fit you in for a sick visit. Feel free to contact us anytime with any questions, problems, or concerns.  It was a pleasure to meet you and your family today!  Websites that have reliable patient information: 1. American Academy of Asthma, Allergy, and Immunology: www.aaaai.org 2. Food Allergy Research and Education (FARE): foodallergy.org 3. Mothers of Asthmatics: http://www.asthmacommunitynetwork.org 4. American College of Allergy, Asthma, and Immunology: MonthlyElectricBill.co.uk   Make sure you are registered to vote! If you have moved or changed any of your contact information, you will need to get this updated before voting!    Reducing Pollen Exposure  The American Academy of Allergy, Asthma and Immunology suggests the following steps to reduce your exposure to pollen during allergy seasons.    1. Do not hang sheets or clothing out to dry; pollen may collect on these items. 2. Do not mow lawns or spend time around  freshly cut grass; mowing stirs up pollen. 3. Keep windows closed at night.  Keep car windows closed while driving. 4. Minimize morning activities outdoors, a time when pollen counts are usually at their highest. 5. Stay indoors as much as  possible when pollen counts or humidity is high and on windy days when pollen tends to remain in the air longer. 6. Use air conditioning when possible.  Many air conditioners have filters that trap the pollen spores. 7. Use a HEPA room air filter to remove pollen form the indoor air you breathe.  Control of Mold Allergen   Mold and fungi can grow on a variety of surfaces provided certain temperature and moisture conditions exist.  Outdoor molds grow on plants, decaying vegetation and soil.  The major outdoor mold, Alternaria and Cladosporium, are found in very high numbers during hot and dry conditions.  Generally, a late Summer - Fall peak is seen for common outdoor fungal spores.  Rain will temporarily lower outdoor mold spore count, but counts rise rapidly when the rainy period ends.  The most important indoor molds are Aspergillus and Penicillium.  Dark, humid and poorly ventilated basements are ideal sites for mold growth.  The next most common sites of mold growth are the bathroom and the kitchen.  Outdoor (Seasonal) Mold Control   1. Use air conditioning and keep windows closed 2. Avoid exposure to decaying vegetation. 3. Avoid leaf raking. 4. Avoid grain handling. 5. Consider wearing a face mask if working in moldy areas.  6.   Indoor (Perennial) Mold Control    1. Maintain humidity below 50%. 2. Clean washable surfaces with 5% bleach solution. 3. Remove sources e.g. contaminated carpets.     Control of House Dust Mite Allergen    House dust mites play a major role in allergic asthma and rhinitis.  They occur in environments with high humidity wherever human skin, the food for dust mites is found. High levels have been detected in dust obtained from mattresses, pillows, carpets, upholstered furniture, bed covers, clothes and soft toys.  The principal allergen of the house dust mite is found in its feces.  A gram of dust may contain 1,000 mites and 250,000 fecal particles.   Mite antigen is easily measured in the air during house cleaning activities.    1. Encase mattresses, including the box spring, and pillow, in an air tight cover.  Seal the zipper end of the encased mattresses with wide adhesive tape. 2. Wash the bedding in water of 130 degrees Farenheit weekly.  Avoid cotton comforters/quilts and flannel bedding: the most ideal bed covering is the dacron comforter. 3. Remove all upholstered furniture from the bedroom. 4. Remove carpets, carpet padding, rugs, and non-washable window drapes from the bedroom.  Wash drapes weekly or use plastic window coverings. 5. Remove all non-washable stuffed toys from the bedroom.  Wash stuffed toys weekly. 6. Have the room cleaned frequently with a vacuum cleaner and a damp dust-mop.  The patient should not be in a room which is being cleaned and should wait 1 hour after cleaning before going into the room. 7. Close and seal all heating outlets in the bedroom.  Otherwise, the room will become filled with dust-laden air.  An electric heater can be used to heat the room. 8. Reduce indoor humidity to less than 50%.  Do not use a humidifier.  Control of Cockroach Allergen  Cockroach allergen has been identified as an important cause of acute attacks of asthma, especially  in urban settings.  There are fifty-five species of cockroach that exist in the [place], however only three, the [place], [place] species produce allergen that can affect patients with Asthma.  Allergens can be obtained from fecal particles, egg casings and secretions from cockroaches.    1. Remove food sources. 2. Reduce access to water. 3. Seal access and entry points. 4. Spray runways with 0.5-1% Diazinon or Chlorpyrifos 5. Blow boric acid power under stoves and refrigerator. 6. Place bait stations (hydramethylnon) at feeding sites.  Control of Dog or Cat Allergen  Avoidance is the best way to manage a dog or cat allergy. If you have a  dog or cat and are allergic to dog or cats, consider removing the dog or cat from the home. If you have a dog or cat but don't want to find it a new home, or if your family wants a pet even though someone in the household is allergic, here are some strategies that may help keep symptoms at bay:  1. Keep the pet out of your bedroom and restrict it to only a few rooms. Be advised that keeping the dog or cat in only one room will not limit the allergens to that room. 2. Don't pet, hug or kiss the dog or cat; if you do, wash your hands with soap and water. 3. High-efficiency particulate air (HEPA) cleaners run continuously in a bedroom or living room can reduce allergen levels over time. 4. Regular use of a high-efficiency vacuum cleaner or a central vacuum can reduce allergen levels. 5. Giving your dog or cat a bath at least once a week can reduce airborne allergen.  Allergy Shots   Allergies are the result of a chain reaction that starts in the immune system. Your immune system controls how your body defends itself. For instance, if you have an allergy to pollen, your immune system identifies pollen as an invader or allergen. Your immune system overreacts by producing antibodies called Immunoglobulin E (IgE). These antibodies travel to cells that release chemicals, causing an allergic reaction.  The concept behind allergy immunotherapy, whether it is received in the form of shots or tablets, is that the immune system can be desensitized to specific allergens that trigger allergy symptoms. Although it requires time and patience, the payback can be long-term relief.  How Do Allergy Shots Work?  Allergy shots work much like a vaccine. Your body responds to injected amounts of a particular allergen given in increasing doses, eventually developing a resistance and tolerance to it. Allergy shots can lead to decreased, minimal or no allergy symptoms.  There generally are two phases: build-up and  maintenance. Build-up often ranges from three to six months and involves receiving injections with increasing amounts of the allergens. The shots are typically given once or twice a week, though more rapid build-up schedules are sometimes used.  The maintenance phase begins when the most effective dose is reached. This dose is different for each person, depending on how allergic you are and your response to the build-up injections. Once the maintenance dose is reached, there are longer periods between injections, typically two to four weeks.  Occasionally doctors give cortisone-type shots that can temporarily reduce allergy symptoms. These types of shots are different and should not be confused with allergy immunotherapy shots.  Who Can Be Treated with Allergy Shots?  Allergy shots may be a good treatment approach for people with allergic rhinitis (hay fever), allergic asthma, conjunctivitis (eye allergy) or stinging  insect allergy.   Before deciding to begin allergy shots, you should consider:  . The length of allergy season and the severity of your symptoms . Whether medications and/or changes to your environment can control your symptoms . Your desire to avoid long-term medication use . Time: allergy immunotherapy requires a major time commitment . Cost: may vary depending on your insurance coverage  Allergy shots for children age 73 and older are effective and often well tolerated. They might prevent the onset of new allergen sensitivities or the progression to asthma.  Allergy shots are not started on patients who are pregnant but can be continued on patients who become pregnant while receiving them. In some patients with other medical conditions or who take certain common medications, allergy shots may be of risk. It is important to mention other medications you talk to your allergist.   When Will I Feel Better?  Some may experience decreased allergy symptoms during the build-up  phase. For others, it may take as long as 12 months on the maintenance dose. If there is no improvement after a year of maintenance, your allergist will discuss other treatment options with you.  If you aren't responding to allergy shots, it may be because there is not enough dose of the allergen in your vaccine or there are missing allergens that were not identified during your allergy testing. Other reasons could be that there are high levels of the allergen in your environment or major exposure to non-allergic triggers like tobacco smoke.  What Is the Length of Treatment?  Once the maintenance dose is reached, allergy shots are generally continued for three to five years. The decision to stop should be discussed with your allergist at that time. Some people may experience a permanent reduction of allergy symptoms. Others may relapse and a longer course of allergy shots can be considered.  What Are the Possible Reactions?  The two types of adverse reactions that can occur with allergy shots are local and systemic. Common local reactions include very mild redness and swelling at the injection site, which can happen immediately or several hours after. A systemic reaction, which is less common, affects the entire body or a particular body system. They are usually mild and typically respond quickly to medications. Signs include increased allergy symptoms such as sneezing, a stuffy nose or hives.  Rarely, a serious systemic reaction called anaphylaxis can develop. Symptoms include swelling in the throat, wheezing, a feeling of tightness in the chest, nausea or dizziness. Most serious systemic reactions develop within 30 minutes of allergy shots. This is why it is strongly recommended you wait in your doctor's office for 30 minutes after your injections. Your allergist is trained to watch for reactions, and his or her staff is trained and equipped with the proper medications to identify and treat  them.  Who Should Administer Allergy Shots?  The preferred location for receiving shots is your prescribing allergist's office. Injections can sometimes be given at another facility where the physician and staff are trained to recognize and treat reactions, and have received instructions by your prescribing allergist.

## 2018-03-19 NOTE — Progress Notes (Signed)
NEW PATIENT  Date of Service/Encounter:  03/19/18  Referring provider: Eustaquio Maize, MD   Assessment:   Moderate persistent asthma, uncomplicated - with eosinophilic phenotype  Seasonal and perennial allergic rhinitis (ragweed, mixed feath, mouse, trees, weeds, grasses, indoor molds, outdoor molds, dust mites, cat, dog and cockroach)  Intrinsic atopic dermatitis   Asthma Reportables:  Severity: moderate persistent  Risk: high Control: not well controlled   Nicholas Burns is a very friendly 67 year old male presenting for an allergy and asthma evaluation.  He has multiple sensitizations on skin testing and is interested in allergy shots.  His asthma is not under good control as evidenced by his spirometry and huge improvement with the nebulizer treatment.  Although he has not required many steroid courses, he does report wheezing on a daily basis.  He is unable to keep up with activities of daily living.  He has no smoking history making COPD unlikely.  He also has no environmental exposures that would explain his symptoms.  It is reassuring that he had such an improvement with the nebulizer treatment.  He did have elevated eosinophils noted in May 2019 of 400, therefore we will initiate an anti-IL-5 treatment.  We will see him again next week so he can receive a sample of Fasenra and we will see him in 2 weeks for his first allergy shot. Risks and benefits of treatment discussed in detail.    Plan/Recommendations:   1. Moderate persistent asthma, uncomplicated - Lung testing looked low today with values in the 58% range or so, but there was an improvement with the nebulizer treatment. - I think we can do better with regards to your asthma management. - We are going to increase you to Symbicort 160/4.5 two puffs twice daily. - We are also going to add on Fasenra (anti-eosinophil injectable medication) which can increase your lung volumes and help you breathe better overall. -  Spacer sample and demonstration provided. - Daily controller medication(s): Fasenra monthly x 3 doses (then every 8 weeks thereafter) and Symbicort 160/4.18mg two puffs twice daily with spacer - Prior to physical activity: ProAir 2 puffs 10-15 minutes before physical activity. - Rescue medications: ProAir 4 puffs every 4-6 hours as needed - Asthma control goals:  * Full participation in all desired activities (may need albuterol before activity) * Albuterol use two time or less a week on average (not counting use with activity) * Cough interfering with sleep two time or less a month * Oral steroids no more than once a year * No hospitalizations  2. Seasonal and perennial allergic rhinitis - Testing today showed: ragweed, mixed feathers, mouse, trees, weeds, grasses, indoor molds, outdoor molds, dust mites, cat, dog and cockroach - Avoidance measures provided. - Continue with: Zyrtec (cetirizine) 167mtablet once daily and Flonase (fluticasone) two sprays per nostril daily - Start taking: Astelin (azelastine) 2 sprays per nostril 1-2 times daily as needed - You can use an extra dose of the antihistamine, if needed, for breakthrough symptoms.  - Consider nasal saline rinses 1-2 times daily to remove allergens from the nasal cavities as well as help with mucous clearance (this is especially helpful to do before the nasal sprays are given) - Allergy shot consent signed today. - Allergy shots "re-train" and "reset" the immune system to ignore environmental allergens and decrease the resulting immune response to those allergens (sneezing, itchy watery eyes, runny nose, nasal congestion, etc).    - Allergy shots improve symptoms in 75-85% of patients.  -  Make an appointment for two weeks for the first injection.   3. Intrinsic atopic dermatitis - Continue with triamcinolone 0.1% ointment twice daily as needed to the worst areas. - Continue with moisturizing twice daily.  4. Return in about 5 days  (around 03/24/2018) for [facility] and two weeks for ALLERGY SHOT and four weeks for OFFICE VISIT.   Subjective:   Nicholas Burns is a 67 y.o. male presenting today for evaluation of  Chief Complaint  Patient presents with  . Allergies    Nicholas Burns has a history of the following: Patient Active Problem List   Diagnosis Date Noted  . Asthma, chronic, moderate persistent, uncomplicated 85/88/5027  . Morbid obesity due to excess calories ([place])  complicated by dm/ hbp, hyperlipidemia 03/18/2017  . Dyspnea on exertion 01/26/2017  . Essential hypertension 09/13/2015  . Asthma 09/30/2013  . Poor dentition 09/30/2013    History obtained from: chart review and patient and his son.  Nicholas Burns was referred by Eustaquio Maize, MD.     Nicholas Burns is a 67 y.o. male presenting for an evaluation of allergies and asthma. He is accompanied by his son, who chimes in intermittently with additions to the story.    Asthma/Respiratory Symptom History: He was diagnosed with asthma when he was 67 years of age. He has been on Symbicort 80/4.5 two puffs BID for the past six months. He is followed by Dr. Melvyn Novas around two times total, but was told to come back as needed. He gets prednisone for his breathing around 2-3 times per year. He uses his rescue inhaler fairly rarely, but his son chimes in that he is wheezing "all of the time. Therefore he likely does not use it when he needs to. He did have an elevated AEC in May 2019.   Allergic Rhinitis Symptom History: He had testing performed in October 2018 that was positive to the entire panel (dust mites, Cladosporium, Aspergillus, Alternaria, cat, dog, cockroach, trees including cedar and pecan, grasses, and ragweed. He is using cetirizine 53m daily. He uses fluticasone nasal spray as needed. He has never been on allergy shots. Symptoms include nasal congestion as well as sneezing. His symptoms never seem to get better throughout the entire year. In fact, they seem to  be worsening over time.   He does have some eczema on his legs. He uses a steroid ointment as needed which seems to keep his symptoms under good control. Otherwise, there is no history of other atopic diseases, including food allergies, drug allergies, stinging insect allergies or urticaria. There is no significant infectious history. Vaccinations are up to date.    Past Medical History: Patient Active Problem List   Diagnosis Date Noted  . Asthma, chronic, moderate persistent, uncomplicated 174/04/8785 . Morbid obesity due to excess calories ([place]  complicated by dm/ hbp, hyperlipidemia 03/18/2017  . Dyspnea on exertion 01/26/2017  . Essential hypertension 09/13/2015  . Asthma 09/30/2013  . Poor dentition 09/30/2013    Medication List:  Allergies as of 03/19/2018   No Known Allergies     Medication List        Accurate as of 03/19/18  4:11 PM. Always use your most recent med list.          albuterol (2.5 MG/3ML) 0.083% nebulizer solution Commonly known as:  PROVENTIL Take 3 mLs (2.5 mg total) by nebulization every 6 (six) hours as needed for wheezing or shortness of breath.   albuterol 108 (90 Base) MCG/ACT inhaler Commonly known  as:  PROVENTIL HFA;VENTOLIN HFA USE 2 PUFFS EVERY 6 HOURS AS NEEDED   budesonide-formoterol 80-4.5 MCG/ACT inhaler Commonly known as:  SYMBICORT Inhale 2 puffs into the lungs 2 (two) times daily.   cetirizine 10 MG tablet Commonly known as:  ZYRTEC Take 1 tablet (10 mg total) by mouth daily.   famotidine 20 MG tablet Commonly known as:  PEPCID One at bedtime   fluticasone 50 MCG/ACT nasal spray Commonly known as:  FLONASE Place 2 sprays into both nostrils daily.   glucose blood test strip 1 each 3 (three) times daily by Other route. Use as instructed   hydrochlorothiazide 25 MG tablet Commonly known as:  HYDRODIURIL TAKE ONE (1) TABLET EACH DAY   irbesartan 150 MG tablet Commonly known as:  AVAPRO Take 1 tablet (150 mg total) by  mouth daily.   metFORMIN 500 MG tablet Commonly known as:  GLUCOPHAGE TAKE 1 TABLET EVERY MORNING WITH BREAKFAST   naproxen sodium 220 MG tablet Commonly known as:  ALEVE Take 220 mg by mouth 2 (two) times daily as needed.   ONETOUCH DELICA LANCETS FINE Misc 1 each 3 (three) times daily by Does not apply route.   ONETOUCH VERIO w/Device Kit 1 each 3 (three) times daily by Does not apply route.   OVER THE COUNTER MEDICATION daily. Medication to help urine flow and frequency -? Name   pravastatin 40 MG tablet Commonly known as:  PRAVACHOL Take 1 tablet (40 mg total) by mouth daily.   triamcinolone 0.025 % ointment Commonly known as:  KENALOG APPLY TWICE DAILY       Birth History: non-contributory  Developmental History: non-contributory.   Past Surgical History: Past Surgical History:  Procedure Laterality Date  . CATARACT EXTRACTION W/PHACO Right 02/17/2013   Procedure: CATARACT EXTRACTION PHACO AND INTRAOCULAR LENS PLACEMENT (IOC);  Surgeon: Nicholas Branch, MD;  Location: [facility];  Service: Ophthalmology;  Laterality: Right;  CDE:11.32  . CIRCUMCISION  age 31   [facility]     Family History: Family History  Problem Relation Age of Onset  . Hypertension Mother   . Arthritis Father   . Rheum arthritis Father   . Asthma Son   . Colon polyps Son   . Mental illness Son   . Colon cancer Neg Hx   . Esophageal cancer Neg Hx   . Rectal cancer Neg Hx   . Stomach cancer Neg Hx      Social History: Hoby lives at home in a house that has rugs and tile throughout the home. There is electric heating and window units for cooling. There is one cat in the home. There are no dust mite coverings on the bedding. There is tobacco exposure inside and outside of the home (from the son, Nicholas Burns does not smoke at all). 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    Review of Systems: a 14-point review of systems is pertinent for what is mentioned in HPI.  Otherwise, all  other systems were negative. Constitutional: negative other than that listed in the HPI Eyes: negative other than that listed in the HPI Ears, nose, mouth, throat, and face: negative other than that listed in the HPI Respiratory: negative other than that listed in the HPI Cardiovascular: negative other than that listed in the HPI Gastrointestinal: negative other than that listed in the HPI Genitourinary: negative other than that listed in the HPI Integument: negative other than that listed in the HPI Hematologic: negative other than that  listed in the HPI Musculoskeletal: negative other than that listed in the HPI Neurological: negative other than that listed in the HPI Allergy/Immunologic: negative other than that listed in the HPI    Objective:   Blood pressure 112/80, pulse 63, temperature 98 F (36.7 C), temperature source Oral, resp. rate 18, height 5' 7.72" (1.72 m), weight 246 lb 3.2 oz (111.7 kg), SpO2 94 %. Body mass index is 37.75 kg/m.   Physical Exam:  General: Alert, interactive, in no acute distress. Pleasant but very quiet.  Eyes: No conjunctival injection bilaterally, no discharge on the right, no discharge on the left and no Horner-Trantas dots present. PERRL bilaterally. EOMI without pain. No photophobia.  Ears: Right TM pearly gray with normal light reflex, Left TM pearly gray with normal light reflex, Right TM intact without perforation and Left TM intact without perforation.  Nose/Throat: External nose within normal limits and septum midline. Turbinates markedly edematous with clear discharge. Posterior oropharynx erythematous with cobblestoning in the posterior oropharynx. Tonsils 2+ without exudates.  Tongue without thrush. Neck: Supple without thyromegaly. Trachea midline. Adenopathy: no enlarged lymph nodes appreciated in the anterior cervical, occipital, axillary, epitrochlear, inguinal, or popliteal regions. Lungs: Decreased breath sounds with expiratory  wheezing bilaterally. No increased work of breathing. CV: Normal S1/S2. No murmurs. Capillary refill <2 seconds.  Abdomen: Nondistended, nontender. No guarding or rebound tenderness. Bowel sounds present in all fields and hyperactive  Skin: Warm and dry, without lesions or rashes. Extremities:  No clubbing, cyanosis or edema. Neuro:   Grossly intact. No focal deficits appreciated. Responsive to questions.  Diagnostic studies:   Spirometry: results abnormal (FEV1: 1.40/58%, FVC: 2.00/56%, FEV1/FVC: 70%).    Spirometry consistent with possible restrictive disease. Albuterol/Atrovent nebulizer treatment given in clinic with significant improvement in FEV1 and FVC per ATS criteria.  Allergy Studies:   Airborne Adult Perc - 2018/03/28 1503    Time Antigen Placed  1502    Allergen Manufacturer  Lavella Hammock    Location  Back    Number of Test  37    Panel 1  Select    1. Control-Buffer 50% Glycerol  Negative    2. Control-Histamine 1 mg/ml  2+    3. Albumin saline  Negative    4. [place]  2+    7. Kentucky Blue  2+    8. Meadow Fescue  2+    9. Perennial Rye  2+    10. Sweet Vernal  2+    12. Cocklebur  Negative    13. Burweed Marshelder  2+    15. Ragweed, Giant  2+    16. Plantain,  English  2+    17. Lamb's Quarters  2+    20. Marsh Elder, Rough  2+    21. Mugwort, Common  Negative    22. Ash mix  2+    24. Beech American  2+    29. Hickory mix  Negative    30. Maple mix  2+    33. Pine mix  Negative    34. Sycamore Eastern  Negative    35. [place], Black Pollen  Negative    39. Penicillium mix  Negative    40. Bipolaris sorokiniana (Helminthosporium)  Negative    41. Drechslera spicifera (Curvularia)  Negative    42. Mucor plumbeus  Negative    43. Fusarium moniliforme  Negative    44. Aureobasidium pullulans (pullulara)  Negative    45. Rhizopus oryzae  Negative    46. Botrytis  cinera  Negative    47. Epicoccum nigrum  2+    48. Phoma betae  Negative    49. Candida Albicans   Negative    50. Trichophyton mentagrophytes  Negative    55. Mixed Feathers  2+    56. Horse Epithelia  Negative    59. Tobacco Leaf  Negative        Allergy testing results were read and interpreted by myself, documented by clinical staff.       Salvatore Marvel, MD [facility]

## 2018-03-22 ENCOUNTER — Encounter: Payer: Self-pay | Admitting: Allergy & Immunology

## 2018-03-23 DIAGNOSIS — J301 Allergic rhinitis due to pollen: Secondary | ICD-10-CM | POA: Diagnosis not present

## 2018-03-23 NOTE — Progress Notes (Signed)
VIALS EXP 03-25-19

## 2018-03-24 ENCOUNTER — Ambulatory Visit (INDEPENDENT_AMBULATORY_CARE_PROVIDER_SITE_OTHER): Payer: Medicare HMO | Admitting: *Deleted

## 2018-03-24 DIAGNOSIS — J4550 Severe persistent asthma, uncomplicated: Secondary | ICD-10-CM

## 2018-03-24 DIAGNOSIS — J4540 Moderate persistent asthma, uncomplicated: Secondary | ICD-10-CM

## 2018-03-24 MED ORDER — BENRALIZUMAB 30 MG/ML ~~LOC~~ SOSY
30.0000 mg | PREFILLED_SYRINGE | SUBCUTANEOUS | Status: DC
  Administered 2018-03-24: 30 mg via SUBCUTANEOUS

## 2018-03-24 NOTE — Progress Notes (Signed)
Immunotherapy   Patient Details  Name: Nicholas Burns MRN: [id number] Date of Birth: [birth year]  03/24/2018  Patient started Fasenra 30 mg.  Sample given in office today.  Patient will receive 30 mg every 28 days x 3 doses and then 30 mg every 8 weeks. Patient does have current EpiPen and has been instructed on proper use.  Consent signed and patient instructions given. Patient waited 45 minutes after injection and no local or systemic reactions.   [name] 03/24/2018, 2:52 PM

## 2018-03-25 ENCOUNTER — Encounter: Payer: Self-pay | Admitting: Pediatrics

## 2018-03-25 ENCOUNTER — Ambulatory Visit (INDEPENDENT_AMBULATORY_CARE_PROVIDER_SITE_OTHER): Payer: Medicare HMO | Admitting: Pediatrics

## 2018-03-25 VITALS — BP 131/85 | HR 65 | Temp 97.7°F | Ht 67.72 in | Wt 250.4 lb

## 2018-03-25 DIAGNOSIS — I10 Essential (primary) hypertension: Secondary | ICD-10-CM | POA: Diagnosis not present

## 2018-03-25 DIAGNOSIS — J3089 Other allergic rhinitis: Secondary | ICD-10-CM

## 2018-03-25 DIAGNOSIS — E119 Type 2 diabetes mellitus without complications: Secondary | ICD-10-CM

## 2018-03-25 DIAGNOSIS — E1169 Type 2 diabetes mellitus with other specified complication: Secondary | ICD-10-CM | POA: Diagnosis not present

## 2018-03-25 DIAGNOSIS — E785 Hyperlipidemia, unspecified: Secondary | ICD-10-CM | POA: Diagnosis not present

## 2018-03-25 MED ORDER — HYDROCHLOROTHIAZIDE 25 MG PO TABS: ORAL_TABLET | ORAL | 1 refills | Status: DC

## 2018-03-25 MED ORDER — IRBESARTAN 150 MG PO TABS: 150.0000 mg | ORAL_TABLET | Freq: Every day | ORAL | 1 refills | Status: AC

## 2018-03-25 MED ORDER — METFORMIN HCL 500 MG PO TABS: ORAL_TABLET | ORAL | 1 refills | Status: DC

## 2018-03-25 MED ORDER — PRAVASTATIN SODIUM 40 MG PO TABS: 40.0000 mg | ORAL_TABLET | Freq: Every day | ORAL | 3 refills | Status: AC

## 2018-03-25 MED ORDER — CETIRIZINE HCL 10 MG PO TABS: 10.0000 mg | ORAL_TABLET | Freq: Every day | ORAL | 6 refills | Status: DC

## 2018-03-25 NOTE — Progress Notes (Signed)
  Subjective:   Patient ID: Nicholas Burns, male    DOB: [birth year], 68 y.o.   MRN: [id number] CC: Medical Management of Chronic Issues  HPI: Nicholas Burns is a 67 y.o. male   Diabetes: Avoiding sugary foods.  Taking medicines regularly.  Hypertension: Taking medicine regularly.  Hyperlipidemia: On statin, no side effects  Allergic rhinitis: Asked for refill on Zyrtec.   No recent fevers.  Continues to walk fairly regularly. Does still get short of breath, improved with albuterol.  Still bothered at times by pain in his bottom of his feet.  Saw podiatry.  Had toenail debridement.  Trying to wear supportive shoes.  Relevant past medical, surgical, family and social history reviewed. Allergies and medications reviewed and updated. Social History   Tobacco Use  Smoking Status Never Smoker  Smokeless Tobacco Never Used   ROS: Per HPI   Objective:    BP 131/85   Pulse 65   Temp 97.7 F (36.5 C) (Oral)   Ht 5' 7.72" (1.72 m)   Wt 250 lb 6.4 oz (113.6 kg)   BMI 38.39 kg/m   Wt Readings from Last 3 Encounters:  03/25/18 250 lb 6.4 oz (113.6 kg)  03/19/18 246 lb 3.2 oz (111.7 kg)  02/19/18 247 lb 6.4 oz (112.2 kg)   Gen: NAD, alert, cooperative with exam, NCAT EYES: EOMI, no conjunctival injection, or no icterus ENT:  TMs pearly gray b/l, OP without erythema LYMPH: no cervical LAD CV: NRRR, normal S1/S2, no murmur, distal pulses 2+ b/l Resp: CTABL, no wheezes, normal WOB Abd: +BS, soft, NTND. no guarding or organomegaly Ext: No edema, warm Neuro: Alert and oriented, strength equal b/l UE and LE, coordination grossly normal MSK: normal muscle bulk  Assessment & Plan:  [name] was seen today for medical management of chronic issues.  Diagnoses and all orders for this visit:  Diabetes mellitus without complication (HCC) Q1F 1 month ago 6.1.  Continue current medicines.  Continue avoiding sugary foods -     metFORMIN (GLUCOPHAGE) 500 MG tablet; TAKE 1 TABLET EVERY MORNING  WITH BREAKFAST  Hyperlipidemia associated with type 2 diabetes mellitus (HCC) Stable, continue below -     pravastatin (PRAVACHOL) 40 MG tablet; Take 1 tablet (40 mg total) by mouth daily.  Essential hypertension Stable, continue below. -     hydrochlorothiazide (HYDRODIURIL) 25 MG tablet; TAKE ONE (1) TABLET EACH DAY -     irbesartan (AVAPRO) 150 MG tablet; Take 1 tablet (150 mg total) by mouth daily.  Allergic rhinitis due to other allergic trigger, unspecified seasonality Stable, continue below -     cetirizine (ZYRTEC) 10 MG tablet; Take 1 tablet (10 mg total) by mouth daily.   Follow up plan: Return in about 3 months (around 06/25/2018). Assunta Found, MD [place]

## 2018-04-02 ENCOUNTER — Ambulatory Visit: Payer: Medicare HMO

## 2018-04-07 ENCOUNTER — Ambulatory Visit (INDEPENDENT_AMBULATORY_CARE_PROVIDER_SITE_OTHER): Payer: Medicare HMO

## 2018-04-07 DIAGNOSIS — J3089 Other allergic rhinitis: Secondary | ICD-10-CM | POA: Diagnosis not present

## 2018-04-07 DIAGNOSIS — J302 Other seasonal allergic rhinitis: Secondary | ICD-10-CM | POA: Diagnosis not present

## 2018-04-07 NOTE — Progress Notes (Signed)
Immunotherapy   Patient Details  Name: [place] Kuang MRN: [id number] Date of Birth: August 09, 1950  04/07/2018  Quinn Plowman started injections for  MOLDS-CR-RW-DM & G-W-T-C-D Following schedule: B  Frequency:1-2 times weekly Epi-Pen:Epi-Pen Available  Consent signed and patient instructions given. Patient waited 30 minutes post injection with no local/systemic reactions.    Rosalio Loud 04/07/2018, 1:38 PM

## 2018-04-14 DIAGNOSIS — R972 Elevated prostate specific antigen [PSA]: Secondary | ICD-10-CM | POA: Diagnosis not present

## 2018-04-21 ENCOUNTER — Ambulatory Visit (INDEPENDENT_AMBULATORY_CARE_PROVIDER_SITE_OTHER): Payer: Medicare HMO | Admitting: Allergy & Immunology

## 2018-04-21 ENCOUNTER — Ambulatory Visit: Payer: Medicare HMO | Admitting: Podiatry

## 2018-04-21 ENCOUNTER — Encounter: Payer: Self-pay | Admitting: Allergy & Immunology

## 2018-04-21 ENCOUNTER — Ambulatory Visit: Payer: Medicare HMO

## 2018-04-21 VITALS — BP 154/86 | HR 84 | Temp 98.0°F | Resp 20

## 2018-04-21 DIAGNOSIS — J4540 Moderate persistent asthma, uncomplicated: Secondary | ICD-10-CM

## 2018-04-21 DIAGNOSIS — L2084 Intrinsic (allergic) eczema: Secondary | ICD-10-CM

## 2018-04-21 DIAGNOSIS — J3089 Other allergic rhinitis: Secondary | ICD-10-CM

## 2018-04-21 DIAGNOSIS — J302 Other seasonal allergic rhinitis: Secondary | ICD-10-CM

## 2018-04-21 DIAGNOSIS — J309 Allergic rhinitis, unspecified: Secondary | ICD-10-CM | POA: Diagnosis not present

## 2018-04-21 MED ORDER — TIOTROPIUM BROMIDE MONOHYDRATE 2.5 MCG/ACT IN AERS: 1.0000 | INHALATION_SPRAY | Freq: Every day | RESPIRATORY_TRACT | 5 refills | Status: DC

## 2018-04-21 NOTE — Patient Instructions (Addendum)
1. Moderate persistent asthma, uncomplicated - Lung testing looked stable today. - We have not received notification that the Berna Bue has been approved, therefore we are going to hold off on that for now.  - We are going to add on Spiriva one puff once daily to help keep your lungs open for 24 hours at at time.  - Daily controller medication(s): Spiriva 2.29mcg one puff once daily and Symbicort 160/4.9mcg two puffs twice daily with spacer - Prior to physical activity: ProAir 2 puffs 10-15 minutes before physical activity. - Rescue medications: ProAir 4 puffs every 4-6 hours as needed - Asthma control goals:  * Full participation in all desired activities (may need albuterol before activity) * Albuterol use two time or less a week on average (not counting use with activity) * Cough interfering with sleep two time or less a month * Oral steroids no more than once a year * No hospitalizations  2. Seasonal and perennial allergic rhinitis (ragweed, mixed feath, mouse, trees, weeds, grasses, indoor molds, outdoor molds, dust mites, cat, dog and cockroach) - Continue with: Zyrtec (cetirizine) 10mg  tablet once daily, Flonase (fluticasone) two sprays per nostril daily and Astelin (azelastine) 2 sprays per nostril 1-2 times daily as needed - Consider nasal saline rinses 1-2 times daily to remove allergens from the nasal cavities as well as help with mucous clearance (this is especially helpful to do before the nasal sprays are given) - Continue with allergy shots at the same schedule.   3. Intrinsic atopic dermatitis - Continue with triamcinolone 0.1% ointment twice daily as needed to the worst areas. - Continue with moisturizing twice daily.  4. Return in about 3 months (around 07/21/2018).   Please inform us of any Emergency Department visits, hospitalizations, or changes in symptoms. Call us before going to the ED for breathing or allergy symptoms since we might be able to fit you in for a sick  visit. Feel free to contact us anytime with any questions, problems, or concerns.  It was a pleasure to see you again today!  Websites that have reliable patient information: 1. American Academy of Asthma, Allergy, and Immunology: www.aaaai.org 2. Food Allergy Research and Education (FARE): foodallergy.org 3. Mothers of Asthmatics: http://www.asthmacommunitynetwork.org 4. American College of Allergy, Asthma, and Immunology: MonthlyElectricBill.co.uk   Make sure you are registered to vote! If you have moved or changed any of your contact information, you will need to get this updated before voting!

## 2018-04-21 NOTE — Progress Notes (Signed)
FOLLOW UP  Date of Service/Encounter:  04/21/18   Assessment:   Moderate persistent asthma, uncomplicated - with eosinophilic phenotype  Seasonal and perennial allergic rhinitis (ragweed, mixed feath, mouse, trees, weeds, grasses, indoor molds, outdoor molds, dust mites, cat, dog and cockroach)  Intrinsic atopic dermatitis   Asthma Reportables:  Severity: moderate persistent  Risk: high Control: not well controlled    Plan/Recommendations:   1. Moderate persistent asthma, uncomplicated - Lung testing looked stable today. - We have not received notification that the Berna Bue has been approved, therefore we are going to hold off on that for now.  - We are going to add on Spiriva one puff once daily to help keep your lungs open for 24 hours at at time.  - Daily controller medication(s): Spiriva 2.58mcg one puff once daily and Symbicort 160/4.80mcg two puffs twice daily with spacer - Prior to physical activity: ProAir 2 puffs 10-15 minutes before physical activity. - Rescue medications: ProAir 4 puffs every 4-6 hours as needed - Asthma control goals:  * Full participation in all desired activities (may need albuterol before activity) * Albuterol use two time or less a week on average (not counting use with activity) * Cough interfering with sleep two time or less a month * Oral steroids no more than once a year * No hospitalizations  2. Seasonal and perennial allergic rhinitis (ragweed, mixed feath, mouse, trees, weeds, grasses, indoor molds, outdoor molds, dust mites, cat, dog and cockroach) - Continue with: Zyrtec (cetirizine) 10mg  tablet once daily, Flonase (fluticasone) two sprays per nostril daily and Astelin (azelastine) 2 sprays per nostril 1-2 times daily as needed - Consider nasal saline rinses 1-2 times daily to remove allergens from the nasal cavities as well as help with mucous clearance (this is especially helpful to do before the nasal sprays are given) -  Continue with allergy shots at the same schedule.   3. Intrinsic atopic dermatitis - Continue with triamcinolone 0.1% ointment twice daily as needed to the worst areas. - Continue with moisturizing twice daily.  4. Return in about 3 months (around 07/21/2018).   Subjective:   Tzvi Economou is a 67 y.o. male presenting today for follow up of  Chief Complaint  Patient presents with  . Generalized Body Aches  . Shortness of Breath  . Cough    white sputum     Demetrick Eichenberger has a history of the following: Patient Active Problem List   Diagnosis Date Noted  . Asthma, chronic, moderate persistent, uncomplicated 58/85/0277  . Morbid obesity due to excess calories ([place])  complicated by dm/ hbp, hyperlipidemia 03/18/2017  . Dyspnea on exertion 01/26/2017  . Essential hypertension 09/13/2015  . Asthma 09/30/2013  . Poor dentition 09/30/2013    History obtained from: chart review and patient.  Collier Salina Kruse's Primary Care Provider is Eustaquio Maize, MD.     Racer is a 67 y.o. male presenting for a follow up visit. He was last seen in November 2019 for his first visit with [place]. At that time, he had testing from another provider that was positive to ragweed, mixed feath, mouse, trees, weeds, grasses, indoor molds, outdoor molds, dust mites, cat, dog and cockroach. His lung function did not look great initially but it improved with the nebulizer treatment. We increased his Symbicort to 160/4.5 two puffs BID. We also added Fasenra to his regimen. For his allergic rhinitis, we continued cetirizine and fluticasone and added on azelastine. He did decide to start allergy shots and  he received his first injection in November 2019.   Since the last visit, he has mostly done well. He has had two days of aches and pains on his neck, but no fever. He does endorse some sputum production and coughing. He denies LOA or change in urinary frequency. He was around someone who had confirmed flu. However, even  after only two days, his symptoms are improving on their own. He has not received his flu shot this season yet.   Asthma/Respiratory Symptom History: He remains on the Symbicort two puffs twice daily. He does have some problems with difficulty breathing when he wakes up in the morning. He does two puffs of albuterol and is better for the rest of the day. He does use it when he walks up a hill close to his house. He walks two miles daily. He does report some pain in his feet from all of the walking. He does see a podiatrist who recommended purchasing sole inserts to balance the weight. He has purchased these and it has helped somewhat. He has not gone to the ED for breathing since the last visit.   Allergic Rhinitis Symptom History: He does have nasal congestion. He does have cetirizine but reports that this makes his mouth dry. Therefore he is not very good about using it. He has not really advanced with his allergy shots since starting them three weeks ago. He has not needed any antibiotics since the last visit.   Otherwise, there have been no changes to his past medical history, surgical history, family history, or social history.    Review of Systems: a 14-point review of systems is pertinent for what is mentioned in HPI.  Otherwise, all other systems were negative.  Constitutional: negative other than that listed in the HPI Eyes: negative other than that listed in the HPI Ears, nose, mouth, throat, and face: negative other than that listed in the HPI Respiratory: negative other than that listed in the HPI Cardiovascular: negative other than that listed in the HPI Gastrointestinal: negative other than that listed in the HPI Genitourinary: negative other than that listed in the HPI Integument: negative other than that listed in the HPI Hematologic: negative other than that listed in the HPI Musculoskeletal: negative other than that listed in the HPI Neurological: negative other than that  listed in the HPI Allergy/Immunologic: negative other than that listed in the HPI    Objective:   Blood pressure (!) 154/86, pulse 84, temperature 98 F (36.7 C), temperature source Oral, resp. rate 20, SpO2 94 %. There is no height or weight on file to calculate BMI.   Physical Exam:  General: Alert, interactive, in no acute distress. Somewhat flat affect.  Eyes: No conjunctival injection bilaterally, no discharge on the right, no discharge on the left and no Horner-Trantas dots present. PERRL bilaterally. EOMI without pain. No photophobia.  Ears: Right TM pearly gray with normal light reflex, Left TM pearly gray with normal light reflex, Right TM intact without perforation and Left TM intact without perforation.  Nose/Throat: External nose within normal limits and septum midline. Turbinates edematous with clear discharge. Posterior oropharynx erythematous without cobblestoning in the posterior oropharynx. Tonsils 2+ without exudates.  Tongue without thrush. Lungs: Clear to auscultation without wheezing, rhonchi or rales. No increased work of breathing. Coarse upper airway noise present.  CV: Normal S1/S2. No murmurs. Capillary refill <2 seconds.  Skin: Warm and dry, without lesions or rashes. Neuro:   Grossly intact. No focal deficits appreciated.  Responsive to questions.  Diagnostic studies:   Spirometry: results abnormal (FEV1: 1.60/66%, FVC: 2.05/58%, FEV1/FVC: 77%).    Spirometry consistent with possible restrictive disease. Overall these are stable compared to the first visit.  Allergy Studies: none       Salvatore Marvel, MD  All[facility] and [facility]

## 2018-05-13 DIAGNOSIS — R69 Illness, unspecified: Secondary | ICD-10-CM | POA: Diagnosis not present

## 2018-05-14 ENCOUNTER — Other Ambulatory Visit: Payer: Self-pay | Admitting: *Deleted

## 2018-05-14 ENCOUNTER — Telehealth: Payer: Self-pay | Admitting: *Deleted

## 2018-05-14 MED ORDER — UMECLIDINIUM BROMIDE 62.5 MCG/INH IN AEPB: 1.0000 | INHALATION_SPRAY | Freq: Every day | RESPIRATORY_TRACT | 5 refills | Status: DC

## 2018-05-14 NOTE — Telephone Encounter (Signed)
Patient's insurance called and stated that Spiriva is not on the patient's formulary. They did state that Incruse and Anoro is on the patient's formulary. Please advise if a change needs to be made.

## 2018-05-14 NOTE — Telephone Encounter (Signed)
Patient's insurance called and stated that Spiriva was not on the formulary but that Incruse and Anoro was on the formulary. Dr. Ernst Bowler verbally advised to send in Incruse for 1 puff once a day to replace Spiriva. Called and left a detailed voicemail on the patient's provided number per permission from [facility] regarding the medication change and how to use the medication. Advised patient to please call with any questions.

## 2018-05-16 NOTE — Telephone Encounter (Signed)
We can do Incruse one puff once daily instead.  Salvatore Marvel, MD Allergy and [place] [facility] [place]

## 2018-05-17 NOTE — Telephone Encounter (Signed)
Informed patient of change in medication. Patient stated that he did not notice much improvement with the Spiriva but he only had only used it once or twice. Told him to use the Incruse consistently for a couple weeks and then call us back with an update. He agreed with plan.

## 2018-07-01 ENCOUNTER — Ambulatory Visit: Payer: Medicare HMO | Admitting: Pediatrics

## 2018-07-01 ENCOUNTER — Ambulatory Visit: Payer: Medicare HMO | Admitting: Family

## 2018-07-21 ENCOUNTER — Ambulatory Visit (INDEPENDENT_AMBULATORY_CARE_PROVIDER_SITE_OTHER): Payer: Medicare Other | Admitting: Allergy & Immunology

## 2018-07-21 ENCOUNTER — Encounter: Payer: Self-pay | Admitting: Allergy & Immunology

## 2018-07-21 VITALS — BP 130/84 | HR 72 | Temp 97.6°F | Resp 18

## 2018-07-21 DIAGNOSIS — J4540 Moderate persistent asthma, uncomplicated: Secondary | ICD-10-CM | POA: Diagnosis not present

## 2018-07-21 DIAGNOSIS — J309 Allergic rhinitis, unspecified: Secondary | ICD-10-CM | POA: Diagnosis not present

## 2018-07-21 DIAGNOSIS — L2084 Intrinsic (allergic) eczema: Secondary | ICD-10-CM

## 2018-07-21 DIAGNOSIS — J302 Other seasonal allergic rhinitis: Secondary | ICD-10-CM

## 2018-07-21 DIAGNOSIS — J3089 Other allergic rhinitis: Secondary | ICD-10-CM

## 2018-07-21 MED ORDER — UMECLIDINIUM BROMIDE 62.5 MCG/INH IN AEPB: 1.0000 | INHALATION_SPRAY | Freq: Every day | RESPIRATORY_TRACT | 5 refills | Status: DC

## 2018-07-21 NOTE — Progress Notes (Signed)
FOLLOW UP  Date of Service/Encounter:  07/21/18   Assessment:   Moderate persistent asthma, uncomplicated- with eosinophilic phenotype  Seasonal and perennial allergic rhinitis(ragweed, mixed feath, mouse, trees, weeds, grasses, indoor molds, outdoor molds, dust mites, cat, dog and cockroach)  Intrinsic atopic dermatitis  ? Memory loss   Asthma Reportables: Severity:moderate persistent Risk:high Control:not well controlled  Plan/Recommendations:   1. Moderate persistent asthma, uncomplicated - Lung testing looked stable today.  - We need you to fill out the paperwork to get the Berna Bue approved to help with your breathing. - Talk to your son about this or have him call [name] at [phone]. - Daily controller medication(s): Incruse one puff once daily and Symbicort 160/4.26mcg two puffs twice daily with spacer - Prior to physical activity: ProAir 2 puffs 10-15 minutes before physical activity. - Rescue medications: ProAir 4 puffs every 4-6 hours as needed - Asthma control goals:  * Full participation in all desired activities (may need albuterol before activity) * Albuterol use two time or less a week on average (not counting use with activity) * Cough interfering with sleep two time or less a month * Oral steroids no more than once a year * No hospitalizations  2. Seasonal and perennial allergic rhinitis (ragweed, mixed feath, mouse, trees, weeds, grasses, indoor molds, outdoor molds, dust mites, cat, dog and cockroach) - Continue with: Zyrtec (cetirizine) 10mg  tablet once daily, Flonase (fluticasone) two sprays per nostril daily and Astelin (azelastine) 2 sprays per nostril 1-2 times daily as needed - Consider nasal saline rinses 1-2 times daily to remove allergens from the nasal cavities as well as help with mucous clearance (this is especially helpful to do before the nasal sprays are given) - Restart allergy shots today.  - Try to come on a more  regular schedule.  3. Intrinsic atopic dermatitis - Continue with triamcinolone 0.1% ointment twice daily as needed to the worst areas. - Continue with moisturizing twice daily.  4. Return in about 2 months (around 09/20/2018).  Subjective:   Nicholas Burns is a 68 y.o. male presenting today for follow up of  Chief Complaint  Patient presents with  . Follow-up    Nicholas Burns has a history of the following: Patient Active Problem List   Diagnosis Date Noted  . Seasonal and perennial allergic rhinitis 07/21/2018  . Asthma, chronic, moderate persistent, uncomplicated 36/64/4034  . Morbid obesity due to excess calories ([place])  complicated by dm/ hbp, hyperlipidemia 03/18/2017  . Dyspnea on exertion 01/26/2017  . Essential hypertension 09/13/2015  . Asthma 09/30/2013  . Poor dentition 09/30/2013    History obtained from: chart review and patient, who is a rather poor historian.   Nicholas Burns is a 68 y.o. male presenting for a follow up visit. He was last seen in December 2019. At that time, lung testing looked stable. We discussed starting Berna Bue but we did not have approval yet from [employer]. We added on Spiriva one puff once daily to work synergistically with Symbicort to improve his breathing. This was not covered by his insurance, therefore we substituted Incruse instead. For his rhinitis, we continued cetirizine 10mg  daily as well as fluticasone and azelastine. We continued with allergy shots at the same schedule.   Asthma/Respiratory Symptom History: He remains on the Symbicort as well as the Incruse. He has had no asthma attacks since he started taking the allergy pill (cetirizine). This has clearly helped. He is breathing better. ACT is 20 indicating good asthma control. He has  not filled out the paperwork that would allow him access to free drug. Tammy did send it to his house and he tell me today that his son Nicholas Burns   Allergic Rhinitis Symptom History: He is no longer on his  allergy shots at all.  He is interested in restarting these. However, transportation was a problem (his son Nicholas Burns takes him here each time). He denies needing antibiotics at all since the last visit.   Otherwise, there have been no changes to his past medical history, surgical history, family history, or social history.    Review of Systems  Constitutional: Negative.  Negative for chills, fever, malaise/fatigue and weight loss.  HENT: Negative.  Negative for congestion, ear discharge, ear pain and sore throat.   Eyes: Negative for pain, discharge and redness.  Respiratory: Positive for cough and shortness of breath. Negative for sputum production and wheezing.   Cardiovascular: Negative.  Negative for chest pain and palpitations.  Gastrointestinal: Negative for abdominal pain, heartburn, nausea and vomiting.  Skin: Negative.  Negative for itching and rash.  Neurological: Negative for dizziness and headaches.  Endo/Heme/Allergies: Negative for environmental allergies. Does not bruise/bleed easily.       Objective:   Blood pressure 130/84, pulse 72, temperature 97.6 F (36.4 C), temperature source Oral, resp. rate 18, SpO2 96 %. There is no height or weight on file to calculate BMI.   Physical Exam:  Physical Exam  Constitutional: He appears well-developed.  HENT:  Head: Normocephalic and atraumatic.  Right Ear: Tympanic membrane, external ear and ear canal normal.  Left Ear: Tympanic membrane and ear canal normal.  Nose: Rhinorrhea present. No mucosal edema, nasal deformity or septal deviation. No epistaxis. Right sinus exhibits no maxillary sinus tenderness and no frontal sinus tenderness. Left sinus exhibits no maxillary sinus tenderness and no frontal sinus tenderness.  Mouth/Throat: Uvula is midline and oropharynx is clear and moist. Mucous membranes are not pale and not dry.  Some cobblestoning present in the posterior oropharynx.  Eyes: Pupils are equal, round, and reactive  to light. Conjunctivae and EOM are normal. Right eye exhibits no chemosis and no discharge. Left eye exhibits no chemosis and no discharge. Right conjunctiva is not injected. Left conjunctiva is not injected.  Cardiovascular: Normal rate, regular rhythm and normal heart sounds.  Respiratory: Effort normal. No accessory muscle usage. No tachypnea. No respiratory distress. He has no wheezes. He has no rhonchi. He has rales. He exhibits no tenderness.  Rales present in the lower lung fields.  Lymphadenopathy:    He has no cervical adenopathy.  Neurological: He is alert.  Skin: No abrasion, no petechiae and no rash noted. Rash is not papular, not vesicular and not urticarial. No erythema. No pallor.  No eczematous lesions appreciated.   Psychiatric: He has a normal mood and affect.     Diagnostic studies:    Spirometry: results abnormal (FEV1: 1.79/77%, FVC: 3.26/96%, FEV1/FVC: 55%).    Spirometry consistent with moderate obstructive disease.    Allergy Studies: none        Salvatore Marvel, MD  Allergy and [place] [facility] [place]

## 2018-07-21 NOTE — Patient Instructions (Addendum)
1. Moderate persistent asthma, uncomplicated - Lung testing looked stable today.  - We need you to fill out the paperwork to get the Nicholas Burns approved to help with your breathing. - Talk to your son about this or have him call [name] at [phone]. - Daily controller medication(s): Incruse one puff once daily and Symbicort 160/4.101mcg two puffs twice daily with spacer - Prior to physical activity: ProAir 2 puffs 10-15 minutes before physical activity. - Rescue medications: ProAir 4 puffs every 4-6 hours as needed - Asthma control goals:  * Full participation in all desired activities (may need albuterol before activity) * Albuterol use two time or less a week on average (not counting use with activity) * Cough interfering with sleep two time or less a month * Oral steroids no more than once a year * No hospitalizations  2. Seasonal and perennial allergic rhinitis (ragweed, mixed feath, mouse, trees, weeds, grasses, indoor molds, outdoor molds, dust mites, cat, dog and cockroach) - Continue with: Zyrtec (cetirizine) 10mg  tablet once daily, Flonase (fluticasone) two sprays per nostril daily and Astelin (azelastine) 2 sprays per nostril 1-2 times daily as needed - Consider nasal saline rinses 1-2 times daily to remove allergens from the nasal cavities as well as help with mucous clearance (this is especially helpful to do before the nasal sprays are given) - Restart allergy shots today.  - Try to come on a more regular schedule.  3. Intrinsic atopic dermatitis - Continue with triamcinolone 0.1% ointment twice daily as needed to the worst areas. - Continue with moisturizing twice daily.  4. Return in about 2 months (around 09/20/2018).   Please inform us of any Emergency Department visits, hospitalizations, or changes in symptoms. Call us before going to the ED for breathing or allergy symptoms since we might be able to fit you in for a sick visit. Feel free to contact us anytime with any  questions, problems, or concerns.  It was a pleasure to see you again today!  Websites that have reliable patient information: 1. American Academy of Asthma, Allergy, and Immunology: www.aaaai.org 2. Food Allergy Research and Education (FARE): foodallergy.org 3. Mothers of Asthmatics: http://www.asthmacommunitynetwork.org 4. American College of Allergy, Asthma, and Immunology: MonthlyElectricBill.co.uk   Make sure you are registered to vote! If you have moved or changed any of your contact information, you will need to get this updated before voting!

## 2018-09-21 ENCOUNTER — Telehealth: Payer: Self-pay

## 2018-09-21 NOTE — Telephone Encounter (Signed)
Patient called with complaints of new onset severe headaches, all over body aches & pains, shortness of breath, dry cough, fever, extreme fatigue. Symptoms began about 3-4 days ago. I spoke with Dr. Ernst Bowler and per his instruction I gave the patient the phone number for  The new COVID-19 testing site. He verbalized understanding of the instructions.

## 2018-09-22 ENCOUNTER — Ambulatory Visit: Payer: Medicare Other | Admitting: Allergy & Immunology

## 2018-10-20 ENCOUNTER — Ambulatory Visit: Payer: Medicare Other | Admitting: Allergy & Immunology

## 2018-10-22 ENCOUNTER — Other Ambulatory Visit: Payer: Self-pay

## 2018-10-22 ENCOUNTER — Ambulatory Visit: Payer: Medicare Other | Admitting: Allergy & Immunology

## 2018-10-22 ENCOUNTER — Encounter: Payer: Self-pay | Admitting: Allergy & Immunology

## 2018-10-22 VITALS — BP 118/78 | HR 78 | Temp 97.6°F | Resp 18

## 2018-10-22 DIAGNOSIS — L2084 Intrinsic (allergic) eczema: Secondary | ICD-10-CM

## 2018-10-22 DIAGNOSIS — J3089 Other allergic rhinitis: Secondary | ICD-10-CM | POA: Diagnosis not present

## 2018-10-22 DIAGNOSIS — J302 Other seasonal allergic rhinitis: Secondary | ICD-10-CM | POA: Diagnosis not present

## 2018-10-22 DIAGNOSIS — J4540 Moderate persistent asthma, uncomplicated: Secondary | ICD-10-CM | POA: Diagnosis not present

## 2018-10-22 MED ORDER — METHYLPREDNISOLONE ACETATE 40 MG/ML IJ SUSP
40.0000 mg | Freq: Once | INTRAMUSCULAR | Status: AC
  Administered 2018-10-22: 40 mg via INTRAMUSCULAR

## 2018-10-22 MED ORDER — OMEPRAZOLE 20 MG PO CPDR: 20.0000 mg | DELAYED_RELEASE_CAPSULE | Freq: Every day | ORAL | 5 refills | Status: DC

## 2018-10-22 MED ORDER — FLUTICASONE PROPIONATE 50 MCG/ACT NA SUSP: 2.0000 | Freq: Every day | NASAL | 6 refills | Status: DC

## 2018-10-22 MED ORDER — BUDESONIDE-FORMOTEROL FUMARATE 160-4.5 MCG/ACT IN AERO: 2.0000 | INHALATION_SPRAY | Freq: Two times a day (BID) | RESPIRATORY_TRACT | 5 refills | Status: DC

## 2018-10-22 MED ORDER — AZELASTINE HCL 0.1 % NA SOLN: NASAL | 5 refills | Status: DC

## 2018-10-22 NOTE — Patient Instructions (Addendum)
1. Moderate persistent asthma, uncomplicated - You were not moving a lot of air in your lungs and you were wheezing, but this did improve with the albuterol treatment.  - Steroid injection given today (this will last for a few days to help get your breathing back on track). - We are also starting a reflux medication (omeprazole 20mg  daily) to help with any reflux symptoms, which make breathing worse.  - We need you to bring in an income verification such as a Fish farm manager Income Letter or a recent Tax Return.  - Then you can restart the injectable medication for your asthma Berna Bue).  - Spacer and teaching again provided today. - The Symbicort needs to be used EVERY day for the best effect.  - Stop the Incruse since it did not seem to be working at all.   - Daily controller medication(s): Symbicort 160/4.43mcg two puffs twice daily with spacer - Prior to physical activity: ProAir 2 puffs 10-15 minutes before physical activity. - Rescue medications: ProAir 4 puffs every 4-6 hours as needed - Asthma control goals:  * Full participation in all desired activities (may need albuterol before activity) * Albuterol use two time or less a week on average (not counting use with activity) * Cough interfering with sleep two time or less a month * Oral steroids no more than once a year * No hospitalizations  2. Seasonal and perennial allergic rhinitis (ragweed, mixed feath, mouse, trees, weeds, grasses, indoor molds, outdoor molds, dust mites, cat, dog and cockroach) - Continue with: Zyrtec (cetirizine) 10mg  tablet once daily, Flonase (fluticasone) two sprays per nostril daily and Astelin (azelastine) 2 sprays per nostril 1-2 times daily as needed - Consider nasal saline rinses 1-2 times daily to remove allergens from the nasal cavities as well as help with mucous clearance (this is especially helpful to do before the nasal sprays are given) - Restart allergy shots next week.  - Try to come on a more  regular schedule.  3. Intrinsic atopic dermatitis - Continue with triamcinolone 0.1% ointment twice daily as needed to the worst areas. - Continue with moisturizing twice daily.  4. Return in about 4 weeks (around 11/19/2018).   Please inform us of any Emergency Department visits, hospitalizations, or changes in symptoms. Call us before going to the ED for breathing or allergy symptoms since we might be able to fit you in for a sick visit. Feel free to contact us anytime with any questions, problems, or concerns.  It was a pleasure to see you again today!  Websites that have reliable patient information: 1. American Academy of Asthma, Allergy, and Immunology: www.aaaai.org 2. Food Allergy Research and Education (FARE): foodallergy.org 3. Mothers of Asthmatics: http://www.asthmacommunitynetwork.org 4. American College of Allergy, Asthma, and Immunology: MonthlyElectricBill.co.uk   Make sure you are registered to vote! If you have moved or changed any of your contact information, you will need to get this updated before voting!

## 2018-10-22 NOTE — Progress Notes (Signed)
FOLLOW UP  Date of Service/Encounter:  10/22/18   Assessment:   Moderate persistent asthma, uncomplicated- with eosinophilic phenotype  Seasonal and perennial allergic rhinitis(ragweed, mixed feath, mouse, trees, weeds, grasses, indoor molds, outdoor molds, dust mites, cat, dog and cockroach)  Intrinsic atopic dermatitis  ? Memory loss   Asthma Reportables: Severity:moderate persistent Risk:high Control:not well controlled   Mr. Fritsch presents for follow-up visit.  He does not appear to be doing well today and he looks very haggard.  He did have some wheezing today, especially in the posterior lung fields, but this did improve with Xopenex MDI treatments.  We did provide him with a steroid injection today to speed his recovery.  We reiterated the need for him to use Symbicort 2 puffs twice daily every day for maximum control.  To simplify his regimen and because he tells [place] that it was not working anyway, we are discontinuing the Incruse.  He definitely needs to get back on the [place].  I talked to Tammy today and we still need a confirmation of his income in the form of an [place] letter or tax return.  He said he would get this to [place] in a timely fashion.  Regarding his allergies, he is endorsing quite a bit of postnasal drip.  I reiterated the need to use his nasal sprays every day.  I also reiterated the need for him to restart his allergen immunotherapy.  We will go ahead and get that restarted next week.  Plan/Recommendations:   1. Moderate persistent asthma, uncomplicated - You were not moving a lot of air in your lungs and you were wheezing, but this did improve with the albuterol treatment.  - Steroid injection given today (this will last for a few days to help get your breathing back on track). - We are also starting a reflux medication (omeprazole 20mg  daily) to help with any reflux symptoms, which make breathing worse.  - We need you to bring in an income  verification such as a [employer] Income Letter or a recent Tax Return.  - Then you can restart the injectable medication for your asthma Berna Bue).  - Spacer and teaching again provided today. - The Symbicort needs to be used EVERY day for the best effect.  - Stop the Incruse since it did not seem to be working at all.   - Daily controller medication(s): Symbicort 160/4.14mcg two puffs twice daily with spacer - Prior to physical activity: ProAir 2 puffs 10-15 minutes before physical activity. - Rescue medications: ProAir 4 puffs every 4-6 hours as needed - Asthma control goals:  * Full participation in all desired activities (may need albuterol before activity) * Albuterol use two time or less a week on average (not counting use with activity) * Cough interfering with sleep two time or less a month * Oral steroids no more than once a year * No hospitalizations  2. Seasonal and perennial allergic rhinitis (ragweed, mixed feath, mouse, trees, weeds, grasses, indoor molds, outdoor molds, dust mites, cat, dog and cockroach) - Continue with: Zyrtec (cetirizine) 10mg  tablet once daily, Flonase (fluticasone) two sprays per nostril daily and Astelin (azelastine) 2 sprays per nostril 1-2 times daily as needed - Consider nasal saline rinses 1-2 times daily to remove allergens from the nasal cavities as well as help with mucous clearance (this is especially helpful to do before the nasal sprays are given) - Restart allergy shots next week.  - Try to come on a more regular schedule.  3. Intrinsic atopic dermatitis - Continue with triamcinolone 0.1% ointment twice daily as needed to the worst areas. - Continue with moisturizing twice daily.  4. Return in about 4 weeks (around 11/19/2018).   Subjective:   Heman Que is a 68 y.o. male presenting today for follow up of  Chief Complaint  Patient presents with  . Asthma    MEDS NOT WORKING  . Shortness of Breath    DIFFICULT WALKING UP A HILL      Khye Hochstetler has a history of the following: Patient Active Problem List   Diagnosis Date Noted  . Seasonal and perennial allergic rhinitis 07/21/2018  . Asthma, chronic, moderate persistent, uncomplicated 02/58/5277  . Morbid obesity due to excess calories ([place])  complicated by dm/ hbp, hyperlipidemia 03/18/2017  . Dyspnea on exertion 01/26/2017  . Essential hypertension 09/13/2015  . Asthma 09/30/2013  . Poor dentition 09/30/2013    History obtained from: chart review and patient.  Cordarrell is a 68 y.o. male presenting for a follow up visit.  He was last seen in March 2020.  At that time, his lung testing looks stable.  We gave him paperwork again to get Berna Bue approved and paid for.  Only continue to increase 1 puff once daily and Symbicort 160/4.5 mcg 2 puffs twice daily.  For his allergic rhinitis, we continued Zyrtec as well as Flonase and Astelin.  We did restart his allergen immunotherapy.  For his atopic dermatitis, we continued with triamcinolone and moisturizing twice daily.  Asthma/Respiratory Symptom History: He is doing the Symbicort but he does not feel that the Incruse is working at all.  I clarified the dosing of the Symbicort and he is actually only using it once a day, maybe less.  He was under the impression that this was an as needed medication.  He has not gotten his Berna Bue in quite some time. His last Fasenra injection was 04/21/18.  I talked to Tammy and she tells me that he never filled in the paperwork for income verification.  He has had increased symptoms at night, including coughing and wheezing.  Allergic Rhinitis Symptom History: He did start allergen immunotherapy but only received on injection in November 2019.  He does report that he has a lot of phlegm in his throat.  When he coughs it up, it is clear color.  He does not have a history of COPD and is never smoked.  He has not needed antibiotics since the last visit.  He is not using his no sprays on a  regular basis, but will get back on that  Eczema Symptom Symptom History: He is moisturizing twice daily.  He has a triamcinolone to use as needed.  Overall, his skin is under fairly good control.  Otherwise, there have been no changes to his past medical history, surgical history, family history, or social history.    Review of Systems  Constitutional: Negative.  Negative for chills, fever, malaise/fatigue and weight loss.  HENT: Negative.  Negative for congestion, ear discharge, ear pain and sore throat.        Positive for postnasal drip.  Eyes: Negative for pain, discharge and redness.  Respiratory: Positive for cough, shortness of breath and wheezing. Negative for sputum production.   Cardiovascular: Negative.  Negative for chest pain and palpitations.  Gastrointestinal: Negative for abdominal pain, heartburn, nausea and vomiting.  Skin: Negative.  Negative for itching and rash.  Neurological: Negative for dizziness and headaches.  Endo/Heme/Allergies: Negative for environmental allergies. Does  not bruise/bleed easily.       Objective:   Blood pressure 118/78, pulse 78, temperature 97.6 F (36.4 C), temperature source Tympanic, resp. rate 18, SpO2 93 %. There is no height or weight on file to calculate BMI.   Physical Exam:  Physical Exam  Constitutional: He appears well-developed.  Somewhat more interactive than normal.  HENT:  Head: Normocephalic and atraumatic.  Right Ear: Tympanic membrane, external ear and ear canal normal.  Left Ear: Tympanic membrane, external ear and ear canal normal.  Nose: Mucosal edema present. No rhinorrhea, nasal deformity or septal deviation. No epistaxis. Right sinus exhibits no maxillary sinus tenderness and no frontal sinus tenderness. Left sinus exhibits no maxillary sinus tenderness and no frontal sinus tenderness.  Mouth/Throat: Uvula is midline and oropharynx is clear and moist. Mucous membranes are not pale and not dry.  He does  have some cobblestoning present in the posterior oropharynx.  Eyes: Pupils are equal, round, and reactive to light. Conjunctivae and EOM are normal. Right eye exhibits no chemosis and no discharge. Left eye exhibits no chemosis and no discharge. Right conjunctiva is not injected. Left conjunctiva is not injected.  Cardiovascular: Normal rate, regular rhythm and normal heart sounds.  Respiratory: Effort normal. No accessory muscle usage. No tachypnea. No respiratory distress. He has decreased breath sounds. He has wheezes. He has no rhonchi. He has no rales. He exhibits no tenderness.  There is decreased air movement at the bases.  He has expiratory wheezing appreciated in the superior posterior fields.  This does improve with 6 puffs of Xopenex.  Lymphadenopathy:    He has no cervical adenopathy.  Neurological: He is alert.  Skin: No abrasion, no petechiae and no rash noted. Rash is not papular, not vesicular and not urticarial. No erythema. No pallor.  No eczematous or urticarial lesions noted.  Psychiatric: He has a normal mood and affect.     Diagnostic studies: none      Salvatore Marvel, MD  [facility]

## 2018-11-12 ENCOUNTER — Encounter: Payer: Self-pay | Admitting: *Deleted

## 2018-11-15 ENCOUNTER — Ambulatory Visit (INDEPENDENT_AMBULATORY_CARE_PROVIDER_SITE_OTHER): Payer: Medicare Other | Admitting: Physician Assistant

## 2018-11-15 ENCOUNTER — Encounter: Payer: Self-pay | Admitting: Physician Assistant

## 2018-11-15 DIAGNOSIS — Z86010 Personal history of colonic polyps: Secondary | ICD-10-CM | POA: Diagnosis not present

## 2018-11-15 DIAGNOSIS — R195 Other fecal abnormalities: Secondary | ICD-10-CM

## 2018-11-15 NOTE — Progress Notes (Signed)
Subjective:    Patient ID: Nicholas Burns, male    DOB: 1950-09-16, 68 y.o.   MRN: [id number] This service was provided via telemedicine.  Telephone/ call. The patient was located at home. The provider was located in provider's GI office. The patient did consent to this telephone visit and is aware of possible charges with her insurance for this visit. To persons participating in this telemedicine service were the patient and I, and the patient's son. Time spent on call; 12 minutes HPI Nicholas Burns is a 68 year old African-American male, known to Dr. Silverio Decamp.  Patient states he was asked to follow-up with GI after he had a recent physical exam and did Hemoccult cards which returned positive.  PCP is Evelina Dun FNP. Patient had undergone colonoscopy on 01/11/2018 with finding of multiple polyps.  He had a total of 14 polyps, with 3 pedunculated polyps measuring 9 to 15 mm in size, 6 sessile polyps measuring 4 to 7 mm, 4 sessile polyps measuring 1 to 3 mm and a 5 mm pedunculated polyp in the left colon as well as scattered diverticulosis. Path on the polyps showed 11 tubular adenomatous polyps in the ascending and transverse colon and 3 adenomatous polyps in the descending colon.  He was advised to have follow-up in 1 year.  Patient is not currently on any aspirin or NSAIDs.  No blood thinners. He denies any abdominal discomfort or changes in bowel habits.  He does occasionally have mild constipation but does not require laxatives.  He has not noticed any melena or hematochezia himself.  No upper GI symptoms, specifically no heartburn or indigestion, no dysphagia or odynophagia.  Appetite has been fine. He was recently started on omeprazole 20 mg p.o. daily by his PCP believe after heme positive stool is found. Neither the patient or patient's son are aware of any laboratory abnormalities.  Review of Systems Pertinent positive and negative review of systems were noted in the above HPI section.  All  other review of systems was otherwise negative.  Outpatient Encounter Medications as of 11/15/2018  Medication Sig  . albuterol (PROVENTIL) (2.5 MG/3ML) 0.083% nebulizer solution Take 3 mLs (2.5 mg total) by nebulization every 6 (six) hours as needed for wheezing or shortness of breath.  Marland Kitchen albuterol (VENTOLIN HFA) 108 (90 Base) MCG/ACT inhaler USE 2 PUFFS EVERY 6 HOURS AS NEEDED  . azelastine (ASTELIN) 0.1 % nasal spray 2 sprays per nostril 1-2 times daily as needed.  . Blood Glucose Monitoring Suppl (ONETOUCH VERIO) w/Device KIT 1 each 3 (three) times daily by Does not apply route.  . budesonide-formoterol (SYMBICORT) 160-4.5 MCG/ACT inhaler Inhale 2 puffs into the lungs 2 (two) times daily.  . cetirizine (ZYRTEC) 10 MG tablet Take 1 tablet (10 mg total) by mouth daily.  . famotidine (PEPCID) 20 MG tablet One at bedtime  . fluticasone (FLONASE) 50 MCG/ACT nasal spray Place 2 sprays into both nostrils daily.  Marland Kitchen glucose blood (ONETOUCH VERIO) test strip 1 each 3 (three) times daily by Other route. Use as instructed  . irbesartan (AVAPRO) 150 MG tablet Take 1 tablet (150 mg total) by mouth daily.  . metFORMIN (GLUCOPHAGE) 500 MG tablet TAKE 1 TABLET EVERY MORNING WITH BREAKFAST  . omeprazole (PRILOSEC) 20 MG capsule Take 1 capsule (20 mg total) by mouth daily.  Glory Rosebush DELICA LANCETS FINE MISC 1 each 3 (three) times daily by Does not apply route.  Marland Kitchen OVER THE COUNTER MEDICATION daily. Medication to help urine flow and frequency -? Name  .  pravastatin (PRAVACHOL) 40 MG tablet Take 1 tablet (40 mg total) by mouth daily.  . Tiotropium Bromide Monohydrate (SPIRIVA RESPIMAT) 2.5 MCG/ACT AERS Inhale 1 puff into the lungs daily.  Marland Kitchen umeclidinium bromide (INCRUSE ELLIPTA) 62.5 MCG/INH AEPB Inhale 1 puff into the lungs daily.   Facility-Administered Encounter Medications as of 11/15/2018  Medication  . Benralizumab SOSY 30 mg   No Known Allergies Patient Active Problem List   Diagnosis Date Noted  .  Intrinsic atopic dermatitis 10/22/2018  . Seasonal and perennial allergic rhinitis 07/21/2018  . Asthma, chronic, moderate persistent, uncomplicated 27/10/2374  . Morbid obesity due to excess calories ([place])  complicated by dm/ hbp, hyperlipidemia 03/18/2017  . Dyspnea on exertion 01/26/2017  . Essential hypertension 09/13/2015  . Asthma 09/30/2013  . Poor dentition 09/30/2013   Social History   Socioeconomic History  . Marital status: Single    Spouse name: Not on file  . Number of children: Not on file  . Years of education: Not on file  . Highest education level: Not on file  Occupational History  . Not on file  Social Needs  . Financial resource strain: Not on file  . Food insecurity    Worry: Not on file    Inability: Not on file  . Transportation needs    Medical: Not on file    Non-medical: Not on file  Tobacco Use  . Smoking status: Never Smoker  . Smokeless tobacco: Never Used  Substance and Sexual Activity  . Alcohol use: No  . Drug use: No  . Sexual activity: Not on file  Lifestyle  . Physical activity    Days per week: Not on file    Minutes per session: Not on file  . Stress: Not on file  Relationships  . Social Herbalist on phone: Not on file    Gets together: Not on file    Attends religious service: Not on file    Active member of club or organization: Not on file    Attends meetings of clubs or organizations: Not on file    Relationship status: Not on file  . Intimate partner violence    Fear of current or ex partner: Not on file    Emotionally abused: Not on file    Physically abused: Not on file    Forced sexual activity: Not on file  Other Topics Concern  . Not on file  Social History Narrative  . Not on file    Nicholas Burns's family history includes Arthritis in his father; Asthma in his son; Colon polyps in his son; Hypertension in his mother; Mental illness in his son; Rheum arthritis in his father.      Objective:     There were no vitals filed for this visit.  Physical Exam not examined today- telephone visit        Assessment & Plan:   #27 68 year old African-American male history of numerous adenomatous polyps found at colonoscopy August 2019 with recent heme positive stool found on Hemoccults. Patient is asymptomatic and has not noted any melena or hematochezia.  Etiology of heme positive stool is not clear, rule out recurrent colon polyps, occult colon lesion, gastropathy or peptic ulcer disease.  #2 asthma #3.  Adult onset diabetes mellitus #4.  Hypertension #5 osteoarthritis  Plan; I have requested patient's records from PCP and labs.  Patient will be scheduled for colonoscopy and upper endoscopy with Dr. Silverio Decamp.  Both procedures were discussed in detail  with the patient including indications risks and benefits and he is agreeable to proceed.  Continue omeprazole 20 mg p.o. every morning. Further recommendations pending findings at endoscopic evaluation.  Kevona Lupinacci Genia Harold PA-C 11/15/2018   Cc: Sharion Balloon, FNP

## 2018-11-15 NOTE — Patient Instructions (Addendum)
If you are age 68 or older, your body mass index should be between 23-30. Your There is no height or weight on file to calculate BMI. If this is out of the aforementioned range listed, please consider follow up with your Primary Care Provider.  If you are age 85 or younger, your body mass index should be between 19-25. Your There is no height or weight on file to calculate BMI. If this is out of the aformentioned range listed, please consider follow up with your Primary Care Provider.   To help prevent the possible spread of infection to our patients, communities, and staff; we will be implementing the following measures:  As of now we are not allowing any visitors/family members to accompany you to any upcoming appointments with [facility]. If you have any concerns about this please contact our office to discuss prior to the appointment.   Continue with omeprazole 20 mg 1 tablet in the am.  It was a pleasure to see you today!  Amy Esterwppd, PA-C

## 2018-11-24 ENCOUNTER — Ambulatory Visit: Payer: Medicare Other | Admitting: Allergy & Immunology

## 2018-11-26 ENCOUNTER — Ambulatory Visit (INDEPENDENT_AMBULATORY_CARE_PROVIDER_SITE_OTHER): Payer: Medicare Other | Admitting: Allergy & Immunology

## 2018-11-26 ENCOUNTER — Encounter: Payer: Self-pay | Admitting: Allergy & Immunology

## 2018-11-26 ENCOUNTER — Other Ambulatory Visit: Payer: Self-pay

## 2018-11-26 DIAGNOSIS — J4540 Moderate persistent asthma, uncomplicated: Secondary | ICD-10-CM

## 2018-11-26 DIAGNOSIS — J302 Other seasonal allergic rhinitis: Secondary | ICD-10-CM | POA: Diagnosis not present

## 2018-11-26 DIAGNOSIS — L2084 Intrinsic (allergic) eczema: Secondary | ICD-10-CM

## 2018-11-26 DIAGNOSIS — J3089 Other allergic rhinitis: Secondary | ICD-10-CM

## 2018-11-26 NOTE — Progress Notes (Signed)
RE: Nicholas Burns MRN: [id number] DOB: [birth year] Date of Telemedicine Visit: 11/26/2018  Referring provider: Sharion Balloon, FNP Primary care provider: Sharion Balloon, FNP  Chief Complaint: Follow-up   Telemedicine Follow Up Visit via Telephone: I connected with Nicholas Burns for a follow up on 11/26/18 by telephone and verified that I am speaking with the correct person using two identifiers.   I discussed the limitations, risks, security and privacy concerns of performing an evaluation and management service by telephone and the availability of in person appointments. I also discussed with the patient that there may be a patient responsible charge related to this service. The patient expressed understanding and agreed to proceed.  Patient is at home.   Provider is at the office.  Visit start time: 10:25 AM Visit end time: 10:47 PM Insurance consent/check in by: Nicholas Burns consent and medical assistant/nurse: Nicholas Burns  History of Present Illness:  He is a 68 y.o. male, who is being followed for persistent asthma as well as P/SAR. His previous allergy office visit was in June 2020 with myself.  At that time, he appeared rather haggard.  He had some wheezing on exam.  We did give him a Depo-Medrol injection.  We reemphasized the need for him to use his Symbicort on a routine basis.  He remains interested in restarting the Nicholas Burns, but he had not return the necessary income paperwork.  Nicholas Burns had reached out to them on a couple of occasions since March to try to explain this.  We also provided additional clarification about the paperwork that was needed.  Regarding his allergies, he was having a lot of postnasal drip.  We emphasized the need to use his nasal sprays on a daily basis.  He also remained interested in restarting his allergen immunotherapy.  Since last visit, he has mostly done well.  He does tell me that he has been using his nebulizer more regularly over the last 24 to 48  hours.  He has had no fevers with this.  He denies any loss of taste or ability to smell.  He denies any sick contacts.  He has been maintaining social distance and has been wearing a mask in public.  He has been using his nebulizer more recently due to the heat. He thinks that he used it once or twice yesterday. He remains on his Symbicort two puffs in the morning and two puffs at night.   Asthma/Respiratory Symptom History: He reports that he has been compliant with the Symbicort.  He is using 2 puffs in the morning and 2 puffs at night.  He has been walking daily in the mornings and seems to have tolerated this well.  He has not needed any prednisone, ER visits, or urgent care visits for his breathing.  Allergic Rhinitis Symptom History: It is unclear which nasal sprays he is using on a routine basis, but regardless, he does report that he has done well.  He has not needed any antibiotics for sinusitis.  He has been using his Zyrtec on a daily basis.  He again references his allergy shots and says he would like to restart these.  Otherwise, there have been no changes to his past medical history, surgical history, family history, or social history.  Assessment and Plan:  Nicholas Burns is a 68 y.o. male with:  Moderate persistent asthma, uncomplicated- with eosinophilic phenotype  Seasonal and perennial allergic rhinitis(ragweed, mixed feath, mouse, trees, weeds, grasses, indoor molds, outdoor molds, dust mites, cat, dog  and cockroach)  Intrinsic atopic dermatitis  ? Memory loss   Asthma Reportables: Severity:moderate persistent Risk:high Control:not well controlled  1. Moderate persistent asthma, uncomplicated - We need you to bring in an income verification such as a Nicholas Burns Income Letter or a recent Tax Return for Nicholas Burns.   - The Symbicort needs to be used EVERY day for the best effect.   - If you are needing your albuterol for more than one week, give [place] a  call and we can manage your plan over the phone (hopefully we can avoid the need for prednisone).  - Daily controller medication(s): Symbicort 160/4.14mg two puffs twice daily with spacer - Prior to physical activity: ProAir 2 puffs 10-15 minutes before physical activity. - Rescue medications: ProAir 4 puffs every 4-6 hours as needed - Asthma control goals:  * Full participation in all desired activities (may need albuterol before activity) * Albuterol use two time or less a week on average (not counting use with activity) * Cough interfering with sleep two time or less a month * Oral steroids no more than once a year * No hospitalizations  2. Seasonal and perennial allergic rhinitis (ragweed, mixed feath, mouse, trees, weeds, grasses, indoor molds, outdoor molds, dust mites, cat, dog and cockroach) - Continue with: Zyrtec (cetirizine) 162mtablet once daily, Flonase (fluticasone) two sprays per nostril daily and Astelin (azelastine) 2 sprays per nostril 1-2 times daily as needed - Consider nasal saline rinses 1-2 times daily to remove allergens from the nasal cavities as well as help with mucous clearance (this is especially helpful to do before the nasal sprays are given) - Restart allergy shots at the next visit.  - Try to come on a more regular schedule.  3. Intrinsic atopic dermatitis - Continue with triamcinolone 0.1% ointment twice daily as needed to the worst areas. - Continue with moisturizing twice daily.  4. Return in about 26 days (around 12/22/2018). This can be an in-person follow up visit.   Diagnostics: None.  Medication List:  Current Outpatient Medications  Medication Sig Dispense Refill  . albuterol (PROVENTIL) (2.5 MG/3ML) 0.083% nebulizer solution Take 3 mLs (2.5 mg total) by nebulization every 6 (six) hours as needed for wheezing or shortness of breath. 150 mL 1  . albuterol (VENTOLIN HFA) 108 (90 Base) MCG/ACT inhaler USE 2 PUFFS EVERY 6 HOURS AS NEEDED 18 g 1  .  azelastine (ASTELIN) 0.1 % nasal spray 2 sprays per nostril 1-2 times daily as needed. 30 mL 5  . Blood Glucose Monitoring Suppl (ONETOUCH VERIO) w/Device KIT 1 each 3 (three) times daily by Does not apply route. 1 kit 0  . budesonide-formoterol (SYMBICORT) 160-4.5 MCG/ACT inhaler Inhale 2 puffs into the lungs 2 (two) times daily. 1 Inhaler 5  . cetirizine (ZYRTEC) 10 MG tablet Take 1 tablet (10 mg total) by mouth daily. 30 tablet 6  . famotidine (PEPCID) 20 MG tablet One at bedtime    . fluticasone (FLONASE) 50 MCG/ACT nasal spray Place 2 sprays into both nostrils daily. 16 g 6  . glucose blood (ONETOUCH VERIO) test strip 1 each 3 (three) times daily by Other route. Use as instructed 100 each 12  . irbesartan (AVAPRO) 150 MG tablet Take 1 tablet (150 mg total) by mouth daily. 90 tablet 1  . metFORMIN (GLUCOPHAGE) 500 MG tablet TAKE 1 TABLET EVERY MORNING WITH BREAKFAST 90 tablet 1  . omeprazole (PRILOSEC) 20 MG capsule Take 1 capsule (20 mg total) by mouth daily. [place]  capsule 5  . ONETOUCH DELICA LANCETS FINE MISC 1 each 3 (three) times daily by Does not apply route. 100 each 12  . OVER THE COUNTER MEDICATION daily. Medication to help urine flow and frequency -? Name    . pravastatin (PRAVACHOL) 40 MG tablet Take 1 tablet (40 mg total) by mouth daily. 90 tablet 3   Current Facility-Administered Medications  Medication Dose Route Frequency Provider Last Rate Last Dose  . Benralizumab SOSY 30 mg  30 mg Subcutaneous Q28 days Valentina Shaggy, Nicholas Burns   30 mg at 03/24/18 0930   Allergies: No Known Allergies I reviewed his past medical history, social history, family history, and environmental history and no significant changes have been reported from previous visits.  Review of Systems  Constitutional: Negative for chills, diaphoresis, fatigue and fever.  HENT: Negative for congestion, ear discharge, ear pain, facial swelling, postnasal drip, rhinorrhea, sinus pressure, sinus pain and sore throat.    Eyes: Negative for pain, discharge and itching.  Respiratory: Negative for apnea, cough, chest tightness and shortness of breath.   Cardiovascular: Negative for chest pain.  Gastrointestinal: Negative for diarrhea and nausea.  Endocrine: Negative for cold intolerance and heat intolerance.  Musculoskeletal: Negative for arthralgias and myalgias.  Skin: Negative for rash.  Allergic/Immunologic: Negative for environmental allergies, food allergies and immunocompromised state.    Objective:  Physical exam not obtained as encounter was done via telephone.   Previous notes and tests were reviewed.  I discussed the assessment and treatment plan with the patient. The patient was provided an opportunity to ask questions and all were answered. The patient agreed with the plan and demonstrated an understanding of the instructions.   The patient was advised to call back or seek an in-person evaluation if the symptoms worsen or if the condition fails to improve as anticipated.  I provided 22 minutes of non-face-to-face time during this encounter.  It was my pleasure to participate in [place] care today. Please feel free to contact me with any questions or concerns.   Sincerely,  Valentina Shaggy, Nicholas Burns

## 2018-11-26 NOTE — Patient Instructions (Addendum)
1. Moderate persistent asthma, uncomplicated - We need you to bring in an income verification such as a [employer] Income Letter or a recent Tax Return for [employer].  - The Symbicort needs to be used EVERY day for the best effect.   - If you are needing your albuterol for more than one week, give [place] a call and we can manage your plan over the phone (hopefully we can avoid the need for prednisone).  - Daily controller medication(s): Symbicort 160/4.65mcg two puffs twice daily with spacer - Prior to physical activity: ProAir 2 puffs 10-15 minutes before physical activity. - Rescue medications: ProAir 4 puffs every 4-6 hours as needed - Asthma control goals:  * Full participation in all desired activities (may need albuterol before activity) * Albuterol use two time or less a week on average (not counting use with activity) * Cough interfering with sleep two time or less a month * Oral steroids no more than once a year * No hospitalizations  2. Seasonal and perennial allergic rhinitis (ragweed, mixed feath, mouse, trees, weeds, grasses, indoor molds, outdoor molds, dust mites, cat, dog and cockroach) - Continue with: Zyrtec (cetirizine) 10mg  tablet once daily, Flonase (fluticasone) two sprays per nostril daily and Astelin (azelastine) 2 sprays per nostril 1-2 times daily as needed - Consider nasal saline rinses 1-2 times daily to remove allergens from the nasal cavities as well as help with mucous clearance (this is especially helpful to do before the nasal sprays are given) - Restart allergy shots at the next visit.  - Try to come on a more regular schedule.  3. Intrinsic atopic dermatitis - Continue with triamcinolone 0.1% ointment twice daily as needed to the worst areas. - Continue with moisturizing twice daily.  4. Return in about 26 days (around 12/22/2018). This can be an in-person follow up visit.   Please inform us of any Emergency Department visits, hospitalizations, or  changes in symptoms. Call us before going to the ED for breathing or allergy symptoms since we might be able to fit you in for a sick visit. Feel free to contact us anytime with any questions, problems, or concerns.  It was a pleasure to talk to you today today!  Websites that have reliable patient information: 1. American Academy of Asthma, Allergy, and Immunology: www.aaaai.org 2. Food Allergy Research and Education (FARE): foodallergy.org 3. Mothers of Asthmatics: http://www.asthmacommunitynetwork.org 4. American College of Allergy, Asthma, and Immunology: www.acaai.org  "Like" [place] on Facebook and Instagram for our latest updates!      Make sure you are registered to vote! If you have moved or changed any of your contact information, you will need to get this updated before voting!  In some cases, you MAY be able to register to vote online: [email or web address]    Voter ID laws are NOT going into effect for the General Election in November 2020! DO NOT let this stop you from exercising your right to vote!   Absentee voting is the SAFEST way to vote during the coronavirus pandemic!   Download and print an absentee ballot request form at [email or web address] or you can scan the QR code below with your smart phone:      More information on absentee ballots can be found here: [email or web address]

## 2018-12-09 ENCOUNTER — Other Ambulatory Visit: Payer: Self-pay | Admitting: *Deleted

## 2018-12-09 MED ORDER — SUPREP BOWEL PREP KIT 17.5-3.13-1.6 GM/177ML PO SOLN: 1.0000 | Freq: Once | ORAL | 0 refills | Status: AC

## 2018-12-09 NOTE — Progress Notes (Signed)
Reviewed and agree with documentation and assessment and plan. K. Veena Nandigam , MD   

## 2018-12-10 ENCOUNTER — Telehealth: Payer: Self-pay | Admitting: Gastroenterology

## 2018-12-10 NOTE — Telephone Encounter (Signed)

## 2018-12-11 ENCOUNTER — Ambulatory Visit (AMBULATORY_SURGERY_CENTER): Payer: Medicare Other | Admitting: Gastroenterology

## 2018-12-11 ENCOUNTER — Encounter: Payer: Self-pay | Admitting: Gastroenterology

## 2018-12-11 ENCOUNTER — Other Ambulatory Visit: Payer: Self-pay

## 2018-12-11 VITALS — BP 136/73 | HR 71 | Temp 97.8°F | Resp 14 | Ht 69.0 in | Wt 257.0 lb

## 2018-12-11 DIAGNOSIS — Z86010 Personal history of colonic polyps: Secondary | ICD-10-CM

## 2018-12-11 DIAGNOSIS — D125 Benign neoplasm of sigmoid colon: Secondary | ICD-10-CM

## 2018-12-11 DIAGNOSIS — K2970 Gastritis, unspecified, without bleeding: Secondary | ICD-10-CM

## 2018-12-11 DIAGNOSIS — K635 Polyp of colon: Secondary | ICD-10-CM

## 2018-12-11 DIAGNOSIS — R195 Other fecal abnormalities: Secondary | ICD-10-CM

## 2018-12-11 DIAGNOSIS — D123 Benign neoplasm of transverse colon: Secondary | ICD-10-CM

## 2018-12-11 DIAGNOSIS — D122 Benign neoplasm of ascending colon: Secondary | ICD-10-CM | POA: Diagnosis not present

## 2018-12-11 MED ORDER — SODIUM CHLORIDE 0.9 % IV SOLN: 500.0000 mL | Freq: Once | INTRAVENOUS | Status: DC

## 2018-12-11 NOTE — Op Note (Signed)
[name] Procedure Date: 12/11/2018 10:28 AM MRN: [id number] Endoscopist: Mauri Pole , MD Age: 68 Referring MD:  Date of Birth: Aug 15, 1950 Gender: Male Account #: [id number] Procedure:                Colonoscopy Indications:              High risk colon cancer surveillance: Personal                            history of colonic polyps, Surveillance: History of                            numerous (> 10) adenomas on last colonoscopy (< 3                            yrs) Medicines:                Monitored Anesthesia Care Procedure:                Pre-Anesthesia Assessment:                           - Prior to the procedure, a History and Physical                            was performed, and patient medications and                            allergies were reviewed. The patient's tolerance of                            previous anesthesia was also reviewed. The risks                            and benefits of the procedure and the sedation                            options and risks were discussed with the patient.                            All questions were answered, and informed consent                            was obtained. Prior Anticoagulants: The patient has                            taken no previous anticoagulant or antiplatelet                            agents. ASA Grade Assessment: III - A patient with                            severe systemic disease. After reviewing the risks  and benefits, the patient was deemed in                            satisfactory condition to undergo the procedure.                           After obtaining informed consent, the colonoscope                            was passed under direct vision. Throughout the                            procedure, the patient's blood pressure, pulse, and                            oxygen saturations were monitored continuously. The                             Model PCF-H190DL [id number]) scope was introduced                            through the anus and advanced to the the cecum,                            identified by appendiceal orifice and ileocecal                            valve. The colonoscopy was performed without                            difficulty. The patient tolerated the procedure                            well. The quality of the bowel preparation was                            good. The terminal ileum, ileocecal valve,                            appendiceal orifice, and rectum were photographed. Scope In: 10:41:00 AM Scope Out: 11:04:49 AM Scope Withdrawal Time: 0 hours 9 minutes 5 seconds  Total Procedure Duration: 0 hours 23 minutes 49 seconds  Findings:                 The perianal and digital rectal examinations were                            normal.                           Two sessile polyps were found in the transverse                            colon and ascending colon. The polyps were 4 to 8  mm in size. These polyps were removed with a cold                            snare. Resection and retrieval were complete.                           Two sessile polyps were found in the sigmoid colon                            and transverse colon. The polyps were 1 to 2 mm in                            size. These polyps were removed with a cold biopsy                            forceps. Resection and retrieval were complete.                           Scattered small and large-mouthed diverticula were                            found in the sigmoid colon, descending colon,                            transverse colon and ascending colon.                           Non-bleeding internal hemorrhoids were found during                            retroflexion. The hemorrhoids were large. Complications:            No immediate complications. Estimated Blood Loss:      Estimated blood loss was minimal. Impression:               - Two 4 to 8 mm polyps in the transverse colon and                            in the ascending colon, removed with a cold snare.                            Resected and retrieved.                           - Two 1 to 2 mm polyps in the sigmoid colon and in                            the transverse colon, removed with a cold biopsy                            forceps. Resected and retrieved.                           -  Moderate diverticulosis in the sigmoid colon, in                            the descending colon, in the transverse colon and                            in the ascending colon.                           - Non-bleeding internal hemorrhoids. Recommendation:           - Patient has a contact number available for                            emergencies. The signs and symptoms of potential                            delayed complications were discussed with the                            patient. Return to normal activities tomorrow.                            Written discharge instructions were provided to the                            patient.                           - Resume previous diet.                           - Continue present medications.                           - Await pathology results.                           - Repeat colonoscopy in 3 years for surveillance                            based on pathology results. Mauri Pole, MD 12/11/2018 11:12:55 AM This report has been signed electronically.

## 2018-12-11 NOTE — Progress Notes (Signed)
JB- Temp CW- Vitals 

## 2018-12-11 NOTE — Patient Instructions (Signed)
Gastritis noted today.  Biopsies taken.  Please read handouts and reports. 4 polyps removed today.  Diverticulosis and hemorrhoids noted. Colonoscopy probably in 3 years.  You will receive a letter of confirmation.    YOU HAD AN ENDOSCOPIC PROCEDURE TODAY AT [facility]:   Refer to the procedure report that was given to you for any specific questions about what was found during the examination.  If the procedure report does not answer your questions, please call your gastroenterologist to clarify.  If you requested that your care partner not be given the details of your procedure findings, then the procedure report has been included in a sealed envelope for you to review at your convenience later.  YOU SHOULD EXPECT: Some feelings of bloating in the abdomen. Passage of more gas than usual.  Walking can help get rid of the air that was put into your GI tract during the procedure and reduce the bloating. If you had a lower endoscopy (such as a colonoscopy or flexible sigmoidoscopy) you may notice spotting of blood in your stool or on the toilet paper. If you underwent a bowel prep for your procedure, you may not have a normal bowel movement for a few days.  Please Note:  You might notice some irritation and congestion in your nose or some drainage.  This is from the oxygen used during your procedure.  There is no need for concern and it should clear up in a day or so.  SYMPTOMS TO REPORT IMMEDIATELY:   Following lower endoscopy (colonoscopy or flexible sigmoidoscopy):  Excessive amounts of blood in the stool  Significant tenderness or worsening of abdominal pains  Swelling of the abdomen that is new, acute  Fever of 100F or higher   Following upper endoscopy (EGD)  Vomiting of blood or coffee ground material  New chest pain or pain under the shoulder blades  Painful or persistently difficult swallowing  New shortness of breath  Fever of 100F or higher  Black,  tarry-looking stools  For urgent or emergent issues, a gastroenterologist can be reached at any hour by calling [phone].   DIET:  We do recommend a small meal at first, but then you may proceed to your regular diet.  Drink plenty of fluids but you should avoid alcoholic beverages for 24 hours.  ACTIVITY:  You should plan to take it easy for the rest of today and you should NOT DRIVE or use heavy machinery until tomorrow (because of the sedation medicines used during the test).    FOLLOW UP: Our staff will call the number listed on your records 48-72 hours following your procedure to check on you and address any questions or concerns that you may have regarding the information given to you following your procedure. If we do not reach you, we will leave a message.  We will attempt to reach you two times.  During this call, we will ask if you have developed any symptoms of COVID 19. If you develop any symptoms (ie: fever, flu-like symptoms, shortness of breath, cough etc.) before then, please call [phone].  If you test positive for Covid 19 in the 2 weeks post procedure, please call and report this information to [place].    If any biopsies were taken you will be contacted by phone or by letter within the next 1-3 weeks.  Please call us at [phone] if you have not heard about the biopsies in 3 weeks.    SIGNATURES/CONFIDENTIALITY: You and/or  your care partner have signed paperwork which will be entered into your electronic medical record.  These signatures attest to the fact that that the information above on your After Visit Summary has been reviewed and is understood.  Full responsibility of the confidentiality of this discharge information lies with you and/or your care-partner.

## 2018-12-11 NOTE — Progress Notes (Signed)
Called to room to assist during endoscopic procedure.  Patient ID and intended procedure confirmed with present staff. Received instructions for my participation in the procedure from the performing physician.  

## 2018-12-11 NOTE — Progress Notes (Signed)
Pt went to bathroom and feels better before discharge.

## 2018-12-11 NOTE — Op Note (Signed)
[facility] Patient Name: Nicholas Burns Procedure Date: 12/11/2018 10:28 AM MRN: [id number] Endoscopist: Mauri Pole , MD Age: 68 Referring MD:  Date of Birth: [birth year] Gender: Male Account #: [id number] Procedure:                Upper GI endoscopy Indications:              Gastrointestinal bleeding of unknown origin. Fecal                            heme occult positive Medicines:                Monitored Anesthesia Care Procedure:                Pre-Anesthesia Assessment:                           - Prior to the procedure, a History and Physical                            was performed, and patient medications and                            allergies were reviewed. The patient's tolerance of                            previous anesthesia was also reviewed. The risks                            and benefits of the procedure and the sedation                            options and risks were discussed with the patient.                            All questions were answered, and informed consent                            was obtained. Prior Anticoagulants: The patient has                            taken no previous anticoagulant or antiplatelet                            agents. ASA Grade Assessment: III - A patient with                            severe systemic disease. After reviewing the risks                            and benefits, the patient was deemed in                            satisfactory condition to undergo the procedure.  After obtaining informed consent, the endoscope was                            passed under direct vision. Throughout the                            procedure, the patient's blood pressure, pulse, and                            oxygen saturations were monitored continuously. The                            Model GIF-HQ190 [id number]) scope was introduced                            through the mouth, and  advanced to the second part                            of duodenum. The upper GI endoscopy was                            accomplished without difficulty. The patient                            tolerated the procedure well. Scope In: Scope Out: Findings:                 The esophagus was normal.                           The Z-line was regular and was found 38 cm from the                            incisors.                           Patchy mild inflammation characterized by                            congestion (edema) and erythema was found in the                            entire examined stomach. Biopsies were taken with a                            cold forceps for Helicobacter pylori testing.                           The examined duodenum was normal. Complications:            No immediate complications. Estimated Blood Loss:     Estimated blood loss was minimal. Impression:               - Normal esophagus.                           -  Z-line regular, 38 cm from the incisors.                           - Gastritis. Biopsied.                           - Normal examined duodenum. Recommendation:           - Patient has a contact number available for                            emergencies. The signs and symptoms of potential                            delayed complications were discussed with the                            patient. Return to normal activities tomorrow.                            Written discharge instructions were provided to the                            patient.                           - Resume previous diet.                           - Continue present medications.                           - Await pathology results. Mauri Pole, MD 12/11/2018 11:09:26 AM This report has been signed electronically.

## 2018-12-14 ENCOUNTER — Telehealth: Payer: Self-pay

## 2018-12-14 NOTE — Telephone Encounter (Signed)
Attempted to reach patient for post-procedure f/u call. No answer. Left message for him to please not hesitate to call us if he has any questions/concerns regarding his care or if he develops SXS of COVID19 in the near future.

## 2018-12-14 NOTE — Telephone Encounter (Signed)
  Follow up Call-  Call back number 12/11/2018 01/11/2018  Post procedure Call Back phone  # [phone]  Permission to leave phone message Yes Yes  Some recent data might be hidden     Patient questions:  Do you have a fever, pain , or abdominal swelling? No. Pain Score  0 *  Have you tolerated food without any problems? Yes.    Have you been able to return to your normal activities? Yes.    Do you have any questions about your discharge instructions: Diet   No. Medications  No. Follow up visit  No.  Do you have questions or concerns about your Care? No.  Actions: * If pain score is 4 or above: No action needed, pain <4.  1. Have you developed a fever since your procedure? no  2.   Have you had an respiratory symptoms (SOB or cough) since your procedure? no  3.   Have you tested positive for COVID 19 since your procedure no  4.   Have you had any family members/close contacts diagnosed with the COVID 19 since your procedure?  no   If yes to any of these questions please route to Joylene John, RN and Alphonsa Gin, Therapist, sports.

## 2018-12-15 ENCOUNTER — Encounter: Payer: Self-pay | Admitting: Gastroenterology

## 2018-12-20 ENCOUNTER — Encounter: Payer: Self-pay | Admitting: Gastroenterology

## 2018-12-22 ENCOUNTER — Ambulatory Visit: Payer: Self-pay | Admitting: Allergy & Immunology

## 2019-01-12 ENCOUNTER — Ambulatory Visit (INDEPENDENT_AMBULATORY_CARE_PROVIDER_SITE_OTHER): Payer: Medicare Other | Admitting: Allergy & Immunology

## 2019-01-12 ENCOUNTER — Other Ambulatory Visit: Payer: Self-pay

## 2019-01-12 ENCOUNTER — Encounter: Payer: Self-pay | Admitting: Allergy & Immunology

## 2019-01-12 VITALS — BP 146/94 | HR 84 | Temp 97.8°F | Resp 16 | Ht 69.0 in | Wt 259.0 lb

## 2019-01-12 DIAGNOSIS — J3089 Other allergic rhinitis: Secondary | ICD-10-CM

## 2019-01-12 DIAGNOSIS — L2084 Intrinsic (allergic) eczema: Secondary | ICD-10-CM

## 2019-01-12 DIAGNOSIS — J309 Allergic rhinitis, unspecified: Secondary | ICD-10-CM

## 2019-01-12 DIAGNOSIS — J4540 Moderate persistent asthma, uncomplicated: Secondary | ICD-10-CM | POA: Diagnosis not present

## 2019-01-12 DIAGNOSIS — J302 Other seasonal allergic rhinitis: Secondary | ICD-10-CM | POA: Diagnosis not present

## 2019-01-12 NOTE — Patient Instructions (Addendum)
1. Moderate persistent asthma, uncomplicated - Lung testing looks a little bit lower than it did in March when we last got it.  - Since you are feeling fairly good, we will not worry about it.  - We will not make any medication changes at this time.  - Daily controller medication(s): Symbicort 160/4.43mcg two puffs twice daily with spacer - Prior to physical activity: ProAir 2 puffs 10-15 minutes before physical activity. - Rescue medications: ProAir 4 puffs every 4-6 hours as needed - Asthma control goals:  * Full participation in all desired activities (may need albuterol before activity) * Albuterol use two time or less a week on average (not counting use with activity) * Cough interfering with sleep two time or less a month * Oral steroids no more than once a year * No hospitalizations  2. Seasonal and perennial allergic rhinitis (ragweed, mixed feath, mouse, trees, weeds, grasses, indoor molds, outdoor molds, dust mites, cat, dog and cockroach)  - Continue with: Zyrtec (cetirizine) 10mg  tablet once daily, Flonase (fluticasone) two sprays per nostril daily as needed and Astelin (azelastine) 2 sprays per nostril 1-2 times daily as needed - Consider nasal saline rinses 1-2 times daily to remove allergens from the nasal cavities as well as help with mucous clearance (this is especially helpful to do before the nasal sprays are given)  - Allergy shots restarted today.   3. Intrinsic atopic dermatitis - Continue with triamcinolone 0.1% ointment twice daily as needed to the worst areas.  - Continue with moisturizing twice daily.  4. Return in about 3 months (around 04/14/2019). This can be an in-person follow up visit.   Please inform us of any Emergency Department visits, hospitalizations, or changes in symptoms. Call us before going to the ED for breathing or allergy symptoms since we might be able to fit you in for a sick visit. Feel free to contact us anytime with any questions, problems,  or concerns.  It was a pleasure to see you again today!  Websites that have reliable patient information: 1. American Academy of Asthma, Allergy, and Immunology: www.aaaai.org 2. Food Allergy Research and Education (FARE): foodallergy.org 3. Mothers of Asthmatics: http://www.asthmacommunitynetwork.org 4. American College of Allergy, Asthma, and Immunology: www.acaai.org  "Like" [place] on Facebook and Instagram for our latest updates!      Make sure you are registered to vote! If you have moved or changed any of your contact information, you will need to get this updated before voting!  In some cases, you MAY be able to register to vote online: [email or web address]    Voter ID laws are NOT going into effect for the General Election in November 2020! DO NOT let this stop you from exercising your right to vote!   Absentee voting is the SAFEST way to vote during the coronavirus pandemic!   Download and print an absentee ballot request form at [email or web address] or you can scan the QR code below with your smart phone:      More information on absentee ballots can be found here: [email or web address]

## 2019-01-12 NOTE — Progress Notes (Signed)
FOLLOW UP  Date of Service/Encounter:  01/12/19   Assessment:   Moderate persistent asthma, uncomplicated- with eosinophilic phenotype  Seasonal and perennial allergic rhinitis(ragweed, mixed feath, mouse, trees, weeds, grasses, indoor molds, outdoor molds, dust mites, cat, dog and cockroach)  Intrinsic atopic dermatitis    Nicholas Burns presents for a follow up visit. His lung function is fairly stable, although his FVC has decreased. Symptomatically, he is doing well despite not having the [place] on board. We had tried Spiriva in the past, which did not work for him at all. He is not using his albuterol often at all, only around once per week or less. He is interested in restarting his allergen immunotherapy, so we did that today. Vials expire in November 2020 and we can re-order them if he continues to come regularly. Typically he tends to come for a few weeks and then stop coming in.   Plan/Recommendations:   1. Moderate persistent asthma, uncomplicated - Lung testing looks a little bit lower than it did in March when we last got it.  - Since you are feeling fairly good, we will not worry about it.  - We will not make any medication changes at this time.  - Daily controller medication(s): Symbicort 160/4.63mcg two puffs twice daily with spacer - Prior to physical activity: ProAir 2 puffs 10-15 minutes before physical activity. - Rescue medications: ProAir 4 puffs every 4-6 hours as needed - Asthma control goals:  * Full participation in all desired activities (may need albuterol before activity) * Albuterol use two time or less a week on average (not counting use with activity) * Cough interfering with sleep two time or less a month * Oral steroids no more than once a year * No hospitalizations  2. Seasonal and perennial allergic rhinitis (ragweed, mixed feath, mouse, trees, weeds, grasses, indoor molds, outdoor molds, dust mites, cat, dog and cockroach)  - Continue with:  Zyrtec (cetirizine) 10mg  tablet once daily, Flonase (fluticasone) two sprays per nostril daily as needed and Astelin (azelastine) 2 sprays per nostril 1-2 times daily as needed - Consider nasal saline rinses 1-2 times daily to remove allergens from the nasal cavities as well as help with mucous clearance (this is especially helpful to do before the nasal sprays are given)  - Allergy shots restarted today.   3. Intrinsic atopic dermatitis - Continue with triamcinolone 0.1% ointment twice daily as needed to the worst areas.  - Continue with moisturizing twice daily.  4. Return in about 3 months (around 04/14/2019). This can be an in-person follow up visit.   Subjective:   Nicholas Burns is a 68 y.o. male presenting today for follow up of  Chief Complaint  Patient presents with   Asthma    Nicholas Burns has a history of the following: Patient Active Problem List   Diagnosis Date Noted   Intrinsic atopic dermatitis 10/22/2018   Seasonal and perennial allergic rhinitis 07/21/2018   Asthma, chronic, moderate persistent, uncomplicated [id number]   Morbid obesity due to excess calories ([place])  complicated by dm/ hbp, hyperlipidemia 03/18/2017   Dyspnea on exertion 01/26/2017   Essential hypertension 09/13/2015   Asthma 09/30/2013   Poor dentition 09/30/2013    History obtained from: chart review and patient.  Nicholas Burns is a 68 y.o. male presenting for a follow up visit. He was last seen in July 2020 as a televisit. At that time, we reminded him to bring in an income verification for approval of his Berna Bue. We continued  with the Symbicort two puffs twice daily as well as albuterol as needed. For his allergic rhinitis, we continued with cetirizine 10mg  daily, Flonase two sprays per nostril daily, and Astelin two sprays per nostril 1-2 times daily as needed.   Asthma/Respiratory Symptom History: He remains on the Symbicort two puffs twice daily. He was in [place] in 2019 but he did not  feel that it worked at all. He has used his rescue inhaler only once or twice. He has not needed any prednisone at all and has not been to the hospital for any breathing problems. He reports that he sleeps well.   Allergic Rhinitis Symptom History: He remains on the cetirizine as well as the nasal sprays. He is not using the nose sprays on a regular basis. Overall he feels that he was doing better when he was on the allergy shots. He is interested in restarting these today.  Eczema Symptom History: He does report that he is having increased itching on his lower legs. He does have the triamcinolone on hand to use on his lower extremities. He does not think that he needs refills today.   Otherwise, there have been no changes to his past medical history, surgical history, family history, or social history.    Review of Systems  Constitutional: Negative.  Negative for chills, fever, malaise/fatigue and weight loss.  HENT: Negative.  Negative for congestion, ear discharge, ear pain, sinus pain and sore throat.   Eyes: Negative for pain, discharge and redness.  Respiratory: Negative for cough, sputum production, shortness of breath and wheezing.   Cardiovascular: Negative.  Negative for chest pain and palpitations.  Gastrointestinal: Negative for abdominal pain, heartburn, nausea and vomiting.  Skin: Positive for itching and rash.  Neurological: Negative for dizziness and headaches.  Endo/Heme/Allergies: Negative for environmental allergies. Does not bruise/bleed easily.       Objective:   Blood pressure (!) 146/94, pulse 84, temperature 97.8 F (36.6 C), temperature source Temporal, resp. rate 16, height 5\' 9"  (1.753 m), weight 259 lb (117.5 kg), SpO2 94 %. Body mass index is 38.25 kg/m.   Physical Exam:  Physical Exam  Constitutional: He appears well-developed.  Seems to be quicker with responses today compared to previous visits with him.   HENT:  Head: Normocephalic and  atraumatic.  Right Ear: Tympanic membrane, external ear and ear canal normal.  Left Ear: Tympanic membrane and ear canal normal.  Nose: No mucosal edema, rhinorrhea, nasal deformity or septal deviation. No epistaxis. Right sinus exhibits no maxillary sinus tenderness and no frontal sinus tenderness. Left sinus exhibits no maxillary sinus tenderness and no frontal sinus tenderness.  Mouth/Throat: Uvula is midline and oropharynx is clear and moist. Mucous membranes are not pale and not dry.  Eyes: Pupils are equal, round, and reactive to light. Conjunctivae and EOM are normal. Right eye exhibits no chemosis and no discharge. Left eye exhibits no chemosis and no discharge. Right conjunctiva is not injected. Left conjunctiva is not injected.  Cardiovascular: Normal rate, regular rhythm and normal heart sounds.  Respiratory: Effort normal and breath sounds normal. No accessory muscle usage. No tachypnea. No respiratory distress. He has no wheezes. He has no rhonchi. He has no rales. He exhibits no tenderness.  He is moving air well in all lung fields. There are no crackles or wheezes noted.   Lymphadenopathy:    He has no cervical adenopathy.  Neurological: He is alert.  Skin: No abrasion, no petechiae and no rash noted. Rash is  not papular, not vesicular and not urticarial. No erythema. No pallor.  He does have some excoriations on the bilateral legs.   Psychiatric: He has a normal mood and affect.     Diagnostic studies:    Spirometry: results abnormal (FEV1: 1.75/70%, FVC: 2.27/62%, FEV1/FVC: 77%).    Spirometry consistent with possible restrictive disease.  Compared to the values of the last visit, his FEV1 is stable but his FVC is decreased from 3.26 down to 2.27.   Allergy Studies: none      Salvatore Marvel, MD  [facility]

## 2019-01-13 ENCOUNTER — Encounter: Payer: Self-pay | Admitting: Podiatry

## 2019-01-13 ENCOUNTER — Other Ambulatory Visit: Payer: Self-pay | Admitting: Podiatry

## 2019-01-13 ENCOUNTER — Ambulatory Visit (INDEPENDENT_AMBULATORY_CARE_PROVIDER_SITE_OTHER): Payer: Medicare Other

## 2019-01-13 ENCOUNTER — Ambulatory Visit (INDEPENDENT_AMBULATORY_CARE_PROVIDER_SITE_OTHER): Payer: Medicare Other | Admitting: Podiatry

## 2019-01-13 ENCOUNTER — Other Ambulatory Visit: Payer: Self-pay

## 2019-01-13 DIAGNOSIS — M2042 Other hammer toe(s) (acquired), left foot: Secondary | ICD-10-CM

## 2019-01-13 DIAGNOSIS — M2041 Other hammer toe(s) (acquired), right foot: Secondary | ICD-10-CM

## 2019-01-13 DIAGNOSIS — M79674 Pain in right toe(s): Secondary | ICD-10-CM

## 2019-01-13 DIAGNOSIS — M7751 Other enthesopathy of right foot: Secondary | ICD-10-CM | POA: Diagnosis not present

## 2019-01-13 DIAGNOSIS — E1140 Type 2 diabetes mellitus with diabetic neuropathy, unspecified: Secondary | ICD-10-CM | POA: Diagnosis not present

## 2019-01-13 DIAGNOSIS — E1149 Type 2 diabetes mellitus with other diabetic neurological complication: Secondary | ICD-10-CM

## 2019-01-13 DIAGNOSIS — M779 Enthesopathy, unspecified: Secondary | ICD-10-CM

## 2019-01-13 DIAGNOSIS — M79671 Pain in right foot: Secondary | ICD-10-CM

## 2019-01-13 DIAGNOSIS — M79675 Pain in left toe(s): Secondary | ICD-10-CM | POA: Diagnosis not present

## 2019-01-13 DIAGNOSIS — B351 Tinea unguium: Secondary | ICD-10-CM

## 2019-01-14 NOTE — Progress Notes (Signed)
Subjective:   Patient ID: Nicholas Burns, male   DOB: 68 y.o.   MRN: [id number]   HPI Patient has developed a lot of pain on the inside of the right ankle which is become very sore and inflamed.  States is been getting worse gradually over the last month and he does not remember injury and also has nail disease 1-5 both feet that he cannot care of and become painful as they thickened and get incurvated in the corners   ROS      Objective:  Physical Exam  Neurovascular status intact at this time with inflammation pain of the posterior tibial tendon as it comes under the medial malleolus it inserts into the navicular with no indication of muscle strength loss.  Thick yellow brittle nailbeds 1-5 both feet with depression of the arch noted bilateral     Assessment:  Posterior tibial tendinitis right with inflammation along with mycotic nail infection with pain 1-5 both feet     Plan:  H&P and x-ray reviewed with patient.  Today I did careful sheath injection of the posterior tibial tendon as it comes under medial malleolus inserts into the navicular 3 mg Dexasone Kenalog 5 mg Xylocaine and applied fascial brace to lift up the arch.  Gave instructions for supportive shoes debrided nailbeds 1-5 both feet with no iatrogenic bleeding and reappoint to recheck in 2 weeks  X-ray indicates that there is moderate depression of the arch with mild increase in the size of the navicular but no indication of fracture or complete loss of the arch height

## 2019-01-21 ENCOUNTER — Ambulatory Visit: Payer: Medicare Other | Admitting: Orthotics

## 2019-01-21 ENCOUNTER — Other Ambulatory Visit: Payer: Self-pay

## 2019-01-21 DIAGNOSIS — M2041 Other hammer toe(s) (acquired), right foot: Secondary | ICD-10-CM

## 2019-01-21 DIAGNOSIS — M2042 Other hammer toe(s) (acquired), left foot: Secondary | ICD-10-CM

## 2019-01-21 DIAGNOSIS — E1140 Type 2 diabetes mellitus with diabetic neuropathy, unspecified: Secondary | ICD-10-CM

## 2019-01-21 DIAGNOSIS — M79671 Pain in right foot: Secondary | ICD-10-CM

## 2019-02-02 NOTE — Progress Notes (Signed)

## 2019-02-15 ENCOUNTER — Other Ambulatory Visit: Payer: Self-pay

## 2019-02-15 DIAGNOSIS — I10 Essential (primary) hypertension: Secondary | ICD-10-CM

## 2019-03-14 ENCOUNTER — Other Ambulatory Visit: Payer: Self-pay

## 2019-03-14 ENCOUNTER — Ambulatory Visit (INDEPENDENT_AMBULATORY_CARE_PROVIDER_SITE_OTHER): Payer: Self-pay | Admitting: Orthotics

## 2019-03-14 DIAGNOSIS — M779 Enthesopathy, unspecified: Secondary | ICD-10-CM

## 2019-03-14 DIAGNOSIS — E1140 Type 2 diabetes mellitus with diabetic neuropathy, unspecified: Secondary | ICD-10-CM | POA: Diagnosis not present

## 2019-03-14 DIAGNOSIS — M2042 Other hammer toe(s) (acquired), left foot: Secondary | ICD-10-CM | POA: Diagnosis not present

## 2019-03-14 DIAGNOSIS — M2041 Other hammer toe(s) (acquired), right foot: Secondary | ICD-10-CM | POA: Diagnosis not present

## 2019-03-14 DIAGNOSIS — L84 Corns and callosities: Secondary | ICD-10-CM | POA: Diagnosis not present

## 2019-03-14 DIAGNOSIS — E1342 Other specified diabetes mellitus with diabetic polyneuropathy: Secondary | ICD-10-CM

## 2019-03-14 DIAGNOSIS — E1149 Type 2 diabetes mellitus with other diabetic neurological complication: Secondary | ICD-10-CM

## 2019-03-14 NOTE — Progress Notes (Signed)
Patient had appointment today for definitive and final diabetic shoe fitting and delivery.  Patient was seen by Cruz Bong C.Ped, [facility].   The inserts fit well and accomplished full contact with the plantar surface of the foot bilateral; the shoes fit well and offered forefoot freedom, no noticible heel slippage, and good toe clearance w/ the insert in place.  Patient was advised to monitor of any skin irritation, breakdown.  Patient was satisfied with fit and function. 

## 2019-03-24 ENCOUNTER — Telehealth: Payer: Self-pay | Admitting: *Deleted

## 2019-03-24 NOTE — Telephone Encounter (Signed)
Patient's son called and stated that Nicholas Burns would like to re-start his allergy injections. His vials expire tomorrow. Please make new set of allergen vials from [facility] for the [place] office. Patient has been placed on the [facility] schedule same day as office visit with Dr. Ernst Bowler for 04/08/2019.

## 2019-03-24 NOTE — Telephone Encounter (Signed)
Spoke with Dr. Ernst Bowler regarding patient restarting allergy injections since this will be his third time for restarting and will have to make new vials.  Patient will have office visit first with Dr. Ernst Bowler and vials will not be made at this time.  Called and left voicemail message for patient's son regarding office visit and to call me back if he had any questions.

## 2019-04-08 ENCOUNTER — Ambulatory Visit: Payer: Self-pay

## 2019-04-08 ENCOUNTER — Encounter: Payer: Self-pay | Admitting: Allergy & Immunology

## 2019-04-08 ENCOUNTER — Ambulatory Visit (INDEPENDENT_AMBULATORY_CARE_PROVIDER_SITE_OTHER): Payer: Medicare Other | Admitting: Allergy & Immunology

## 2019-04-08 ENCOUNTER — Other Ambulatory Visit: Payer: Self-pay

## 2019-04-08 VITALS — BP 138/88 | HR 69 | Temp 97.8°F | Resp 18

## 2019-04-08 DIAGNOSIS — J3089 Other allergic rhinitis: Secondary | ICD-10-CM

## 2019-04-08 DIAGNOSIS — L2084 Intrinsic (allergic) eczema: Secondary | ICD-10-CM

## 2019-04-08 DIAGNOSIS — J4540 Moderate persistent asthma, uncomplicated: Secondary | ICD-10-CM

## 2019-04-08 DIAGNOSIS — J302 Other seasonal allergic rhinitis: Secondary | ICD-10-CM

## 2019-04-08 MED ORDER — BREZTRI AEROSPHERE 160-9-4.8 MCG/ACT IN AERO: 2.0000 | INHALATION_SPRAY | Freq: Two times a day (BID) | RESPIRATORY_TRACT | 5 refills | Status: DC

## 2019-04-08 NOTE — Progress Notes (Signed)
FOLLOW UP  Date of Service/Encounter:  04/08/19   Assessment:   Moderate persistent asthma, uncomplicated- with eosinophilic phenotype  Seasonal and perennial allergic rhinitis(ragweed, mixed feath, mouse, trees, weeds, grasses, indoor molds, outdoor molds, dust mites, cat, dog and cockroach)  Intrinsic atopic dermatitis  Plan/Recommendations:    1. Moderate persistent asthma, uncomplicated - Lung testing looks a little bit lower today. - It did improve with the albuterol medication, however.  - We are going to change you from Symbicort to [place].  - We could consider the use of Dupixent which can be administered at home, however we are going to hold off for now.  - Daily controller medication(s): Breztri two puffs twice daily with spacer - Prior to physical activity: ProAir 2 puffs 10-15 minutes before physical activity. - Rescue medications: ProAir 4 puffs every 4-6 hours as needed - Asthma control goals:  * Full participation in all desired activities (may need albuterol before activity) * Albuterol use two time or less a week on average (not counting use with activity) * Cough interfering with sleep two time or less a month * Oral steroids no more than once a year * No hospitalizations  2. Seasonal and perennial allergic rhinitis (ragweed, mixed feath, mouse, trees, weeds, grasses, indoor molds, outdoor molds, dust mites, cat, dog and cockroach)  - Continue with: Zyrtec (cetirizine) 10mg  tablet once daily, Flonase (fluticasone) two sprays per nostril daily as needed and Astelin (azelastine) 2 sprays per nostril 1-2 times daily as needed - Consider nasal saline rinses 1-2 times daily to remove allergens from the nasal cavities as well as help with mucous clearance (this is especially helpful to do before the nasal sprays are given)  - We are not going to restart shots today.  3. Intrinsic atopic dermatitis - Continue with triamcinolone 0.1% ointment twice daily as  needed to the worst areas.  - Continue with moisturizing twice daily.  4. Follow up in three months earlier if needed.   Subjective:   Nicholas Burns is a 68 y.o. male presenting today for follow up of  Chief Complaint  Patient presents with   Asthma    [name] has a history of the following: Patient Active Problem List   Diagnosis Date Noted   Intrinsic atopic dermatitis 10/22/2018   Seasonal and perennial allergic rhinitis 07/21/2018   Asthma, chronic, moderate persistent, uncomplicated [id number]   Morbid obesity due to excess calories ([place])  complicated by dm/ hbp, hyperlipidemia 03/18/2017   Dyspnea on exertion 01/26/2017   Essential hypertension 09/13/2015   Asthma 09/30/2013   Poor dentition 09/30/2013    History obtained from: chart review and patient.  Nicholas Burns is a 68 y.o. male presenting for a follow up visit.  Since the last visit, he has been fairly stable. He notes that he cannot breathe in the morning times. He will take a couple of puffs in the morning and he does fine during the rest of the day. He does report that he had a problems with shortness of breath when he was going up a hill. He had improvement once he reached the top of the hill. He had this only once. I clarified more and he actually uses the Symbicort in the morning.   Asthma/Respiratory Symptom History: He remains on Symbicort two puffs BID. He is using his red one (this turns out to be Symbicort) every morning, however. He estimates that he uses a rescue inhaler around one MDI every few months. Demarrius's asthma has been  well controlled. He has not required rescue medication, experienced nocturnal awakenings due to lower respiratory symptoms, nor have activities of daily living been limited. He has required no Emergency Department or Urgent Care visits for his asthma. He has required zero courses of systemic steroids for asthma exacerbations since the last visit. ACT score today is 13,  indicating terrible asthma symptom control. He has not required any ED visits for his breathing at all.   Allergic Rhinitis Symptom History: He remains on and no spray.  He also remains on Zyrtec daily.  He seems to be confused on the nose sprays.  He has not required any antibiotics since last visit.  He does report a cough productive of clear sputum.  While he remains interested in allergen immunotherapy, he does admit that he does not have reliable transportation to be able to make it on a weekly basis.  He continues to remain safe with regards to exposure.  He denies any known contacts with COVID-19. Otherwise, there have been no changes to his past medical history, surgical history, family history, or social history.    Review of Systems  Constitutional: Negative.  Negative for chills, fever, malaise/fatigue and weight loss.  HENT: Negative.  Negative for congestion, ear discharge and ear pain.   Eyes: Negative for pain, discharge and redness.  Respiratory: Positive for shortness of breath. Negative for cough, sputum production and wheezing.   Cardiovascular: Negative.  Negative for chest pain and palpitations.  Gastrointestinal: Negative for abdominal pain, heartburn, nausea and vomiting.  Skin: Negative.  Negative for itching and rash.  Neurological: Negative for dizziness and headaches.  Endo/Heme/Allergies: Negative for environmental allergies. Does not bruise/bleed easily.       Objective:   Blood pressure 138/88, pulse 69, temperature 97.8 F (36.6 C), temperature source Temporal, resp. rate 18, SpO2 98 %. There is no height or weight on file to calculate BMI.   Physical Exam:  Physical Exam  Constitutional: He appears well-developed.  Talkative male.  Smells like mothballs.  HENT:  Head: Normocephalic and atraumatic.  Right Ear: Tympanic membrane, external ear and ear canal normal.  Left Ear: Tympanic membrane and ear canal normal.  Nose: No mucosal edema,  rhinorrhea, nasal deformity or septal deviation. No epistaxis. Right sinus exhibits no maxillary sinus tenderness and no frontal sinus tenderness. Left sinus exhibits no maxillary sinus tenderness and no frontal sinus tenderness.  Mouth/Throat: Uvula is midline and oropharynx is clear and moist. Mucous membranes are not pale and not dry.  Eyes: Pupils are equal, round, and reactive to light. Conjunctivae and EOM are normal. Right eye exhibits no chemosis and no discharge. Left eye exhibits no chemosis and no discharge. Right conjunctiva is not injected. Left conjunctiva is not injected.  Cardiovascular: Normal rate, regular rhythm and normal heart sounds.  Respiratory: Effort normal and breath sounds normal. No accessory muscle usage. No tachypnea. No respiratory distress. He has no wheezes. He has no rhonchi. He has no rales. He exhibits no tenderness.  Some faint wheezing in the inferior fields.  No crackles noted.  Lymphadenopathy:    He has no cervical adenopathy.  Neurological: He is alert.  Skin: No abrasion, no petechiae and no rash noted. Rash is not papular, not vesicular and not urticarial. No erythema. No pallor.  No eczematous or urticarial lesions noted.  Psychiatric: He has a normal mood and affect.     Diagnostic studies:    Spirometry: results abnormal (FEV1: 1.81/73%, FVC: 2.23/62%, FEV1/FVC: 81%).  Spirometry consistent with possible restrictive disease. 4 puffs of albuterol via MDI treatment given in clinic with improvement in FEV1 and FVC, but not significant per ATS criteria.  Allergy Studies: none       Salvatore Marvel, MD  [facility]

## 2019-04-08 NOTE — Patient Instructions (Addendum)
1. Moderate persistent asthma, uncomplicated - Lung testing looks a little bit lower today. - It did improve with the albuterol medication, however.  - We are going to change you from Symbicort to [facility].  - Daily controller medication(s): Breztri two puffs twice daily with spacer - Prior to physical activity: ProAir 2 puffs 10-15 minutes before physical activity. - Rescue medications: ProAir 4 puffs every 4-6 hours as needed - Asthma control goals:  * Full participation in all desired activities (may need albuterol before activity) * Albuterol use two time or less a week on average (not counting use with activity) * Cough interfering with sleep two time or less a month * Oral steroids no more than once a year * No hospitalizations  2. Seasonal and perennial allergic rhinitis (ragweed, mixed feath, mouse, trees, weeds, grasses, indoor molds, outdoor molds, dust mites, cat, dog and cockroach)  - Continue with: Zyrtec (cetirizine) 10mg  tablet once daily, Flonase (fluticasone) two sprays per nostril daily as needed and Astelin (azelastine) 2 sprays per nostril 1-2 times daily as needed - Consider nasal saline rinses 1-2 times daily to remove allergens from the nasal cavities as well as help with mucous clearance (this is especially helpful to do before the nasal sprays are given)  - We are not going to restart shots today.  3. Intrinsic atopic dermatitis - Continue with triamcinolone 0.1% ointment twice daily as needed to the worst areas.  - Continue with moisturizing twice daily.  4. No follow-ups on file. This can be an in-person, a virtual Webex or a telephone follow up visit.   Please inform us of any Emergency Department visits, hospitalizations, or changes in symptoms. Call us before going to the ED for breathing or allergy symptoms since we might be able to fit you in for a sick visit. Feel free to contact us anytime with any questions, problems, or concerns.  It was a pleasure to  see you again today!  Websites that have reliable patient information: 1. American Academy of Asthma, Allergy, and Immunology: www.aaaai.org 2. Food Allergy Research and Education (FARE): foodallergy.org 3. Mothers of Asthmatics: http://www.asthmacommunitynetwork.org 4. American College of Allergy, Asthma, and Immunology: www.acaai.org  "Like" [place] on Facebook and Instagram for our latest updates!      Make sure you are registered to vote! If you have moved or changed any of your contact information, you will need to get this updated before voting!  In some cases, you MAY be able to register to vote online: [email or web address]

## 2019-04-13 ENCOUNTER — Encounter: Payer: Self-pay | Admitting: Podiatry

## 2019-04-13 ENCOUNTER — Other Ambulatory Visit: Payer: Self-pay

## 2019-04-13 ENCOUNTER — Ambulatory Visit (INDEPENDENT_AMBULATORY_CARE_PROVIDER_SITE_OTHER): Payer: Medicare Other | Admitting: Podiatry

## 2019-04-13 DIAGNOSIS — B351 Tinea unguium: Secondary | ICD-10-CM | POA: Diagnosis not present

## 2019-04-13 DIAGNOSIS — M79674 Pain in right toe(s): Secondary | ICD-10-CM

## 2019-04-13 DIAGNOSIS — E1140 Type 2 diabetes mellitus with diabetic neuropathy, unspecified: Secondary | ICD-10-CM

## 2019-04-13 DIAGNOSIS — E1149 Type 2 diabetes mellitus with other diabetic neurological complication: Secondary | ICD-10-CM

## 2019-04-13 DIAGNOSIS — M79675 Pain in left toe(s): Secondary | ICD-10-CM

## 2019-04-13 NOTE — Progress Notes (Signed)
Complaint:  Visit Type: Patient returns to my office for continued preventative foot care services. Complaint: Patient states" my nails have grown long and thick and become painful to walk and wear shoes" Patient has been diagnosed with DM with no foot complications. The patient presents for preventative foot care services.  Podiatric Exam: Vascular: dorsalis pedis and posterior tibial pulses are palpable bilateral. Capillary return is immediate. Temperature gradient is WNL. Skin turgor WNL  Sensorium: Normal Semmes Weinstein monofilament test. Normal tactile sensation bilaterally. Nail Exam: Pt has thick disfigured discolored nails with subungual debris noted bilateral entire nail hallux through fifth toenails Ulcer Exam: There is no evidence of ulcer or pre-ulcerative changes or infection. Orthopedic Exam: Muscle tone and strength are WNL. No limitations in general ROM. No crepitus or effusions noted. Foot type and digits show no abnormalities. Bony prominences are unremarkable. Skin: No Porokeratosis. No infection or ulcers  Diagnosis:  Onychomycosis, , Pain in right toe, pain in left toes  Treatment & Plan Procedures and Treatment: Consent by patient was obtained for treatment procedures.   Debridement of mycotic and hypertrophic toenails, 1 through 5 bilateral and clearing of subungual debris. No ulceration, no infection noted.  Return Visit-Office Procedure: Patient instructed to return to the office for a follow up visit 3 months for continued evaluation and treatment.    Sasha Rogel DPM 

## 2019-07-08 ENCOUNTER — Encounter: Payer: Self-pay | Admitting: Allergy & Immunology

## 2019-07-08 ENCOUNTER — Other Ambulatory Visit: Payer: Self-pay

## 2019-07-08 ENCOUNTER — Ambulatory Visit (INDEPENDENT_AMBULATORY_CARE_PROVIDER_SITE_OTHER): Payer: Medicare Other | Admitting: Allergy & Immunology

## 2019-07-08 DIAGNOSIS — J3089 Other allergic rhinitis: Secondary | ICD-10-CM | POA: Diagnosis not present

## 2019-07-08 DIAGNOSIS — J302 Other seasonal allergic rhinitis: Secondary | ICD-10-CM

## 2019-07-08 DIAGNOSIS — J4540 Moderate persistent asthma, uncomplicated: Secondary | ICD-10-CM | POA: Diagnosis not present

## 2019-07-08 DIAGNOSIS — R0602 Shortness of breath: Secondary | ICD-10-CM | POA: Diagnosis not present

## 2019-07-08 DIAGNOSIS — L2084 Intrinsic (allergic) eczema: Secondary | ICD-10-CM

## 2019-07-08 MED ORDER — BREZTRI AEROSPHERE 160-9-4.8 MCG/ACT IN AERO: 2.0000 | INHALATION_SPRAY | Freq: Two times a day (BID) | RESPIRATORY_TRACT | 5 refills | Status: DC

## 2019-07-08 NOTE — Patient Instructions (Addendum)
1. Moderate persistent asthma, uncomplicated - Refill for Breztri provided today. - Hopefully it is covered, but call us if there are problems.  - Daily controller medication(s): Breztri two puffs twice daily with spacer - Prior to physical activity: ProAir 2 puffs 10-15 minutes before physical activity. - Rescue medications: ProAir 4 puffs every 4-6 hours as needed - Asthma control goals:  * Full participation in all desired activities (may need albuterol before activity) * Albuterol use two time or less a week on average (not counting use with activity) * Cough interfering with sleep two time or less a month * Oral steroids no more than once a year * No hospitalizations  2. Seasonal and perennial allergic rhinitis (ragweed, mixed feath, mouse, trees, weeds, grasses, indoor molds, outdoor molds, dust mites, cat, dog and cockroach)  - Continue with: Zyrtec (cetirizine) 10mg  tablet once daily, Flonase (fluticasone) two sprays per nostril daily as needed and Astelin (azelastine) 2 sprays per nostril 1-2 times daily as needed - Consider nasal saline rinses 1-2 times daily to remove allergens from the nasal cavities as well as help with mucous clearance (this is especially helpful to do before the nasal sprays are given)  - We are not going to restart shots today.  3. Intrinsic atopic dermatitis - Continue with triamcinolone 0.1% ointment twice daily as needed to the worst areas.  - Continue with moisturizing twice daily.  4. Follow up in three months or earlier if needed This can be an in-person, a virtual Webex or a telephone follow up visit.   Please inform us of any Emergency Department visits, hospitalizations, or changes in symptoms. Call us before going to the ED for breathing or allergy symptoms since we might be able to fit you in for a sick visit. Feel free to contact us anytime with any questions, problems, or concerns.  It was a pleasure to talk to you today today!  Websites that  have reliable patient information: 1. American Academy of Asthma, Allergy, and Immunology: www.aaaai.org 2. Food Allergy Research and Education (FARE): foodallergy.org 3. Mothers of Asthmatics: http://www.asthmacommunitynetwork.org 4. American College of Allergy, Asthma, and Immunology: www.acaai.org   COVID-19 Vaccine Information can be found at: ShippingScam.co.uk For questions related to vaccine distribution or appointments, please email vaccine@[email or web address] .com or call [phone].     "Like" [place] on Facebook and Instagram for our latest updates!        Make sure you are registered to vote! If you have moved or changed any of your contact information, you will need to get this updated before voting!  In some cases, you MAY be able to register to vote online: [email or web address]

## 2019-07-08 NOTE — Progress Notes (Signed)
RE: Nicholas Burns MRN: [id number] DOB: [birth year] Date of Telemedicine Visit: 07/08/2019  Referring provider: Sharion Balloon, FNP Primary care provider: Sharion Balloon, FNP  Chief Complaint: Asthma and Follow-up   Telemedicine Follow Up Visit via Telephone: I connected with Nicholas Burns for a follow up on 07/09/19 by telephone and verified that I am speaking with the correct person using two identifiers.   I discussed the limitations, risks, security and privacy concerns of performing an evaluation and management service by telephone and the availability of in person appointments. I also discussed with the patient that there may be a patient responsible charge related to this service. The patient expressed understanding and agreed to proceed.  Patient is at home.  Provider is at the office.  Visit start time: 3:33 PM Visit end time: 3:53 PM Insurance consent/check in by: [facility] consent and medical assistant/nurse: Vanissa                                                                                                                                                                                                                                            History of Present Illness:  He is a 69 y.o. male, who is being followed for moderate persistent asthma as well as S/SAR. His previous allergy office visit was in November 2020 with myself. At that time, his lung testing looked slightly lower. We decided to change from Symbicort to [place]. We did discuss he use of Dupixent for long term management of his atopic disease. He had previous been on Fasenra, but transportation had been an issue for administration of this. For his rhinitis, we continued to cetirizine as well as fluticasone and azelastine. Atopic dermatitis was controlled with triamcinolone 0.1% ointment twice daily.  Since the last visit, he has mostly done well. Today, he missed his in person appointment because he did  not have transportation. Typically, he has issues with transportation but uses either Medicare transportation (cancelled this week due to the weather) or his son.   Asthma/Respiratory Symptom History: He is breathing fairly well, but everytime that he is cooking he starts to have problems. This might be associated with a particular oil that he is cooking with. He does have an exhaust fan but it does not work at all. He is using an inhaler two puffs twice daily. He is using the "new one"  which he describes as a pill that you puncture. I never prescribed him Spiriva so I am unsure where he obtained this.  s back over the hill, it disappears?   He does report that he had problems with breathing when he was at [facility]. He had to stop three times when he goes up the hill. He has brought this up to his PCP, but he is unsure of the plan. He denies chest pain and fevers. He has not tried using albuterol to see if this helps at all.   Allergic Rhinitis Symptom History: He remains on the cetirizine and the nose sprays, although he cannot name the nose sprays themselves. He has not needed antibiotics for sinusitis at all. Due to transportation, he does not want to restart the allergy shots.   Eczema Symptom Symptom History: He remains on the triamcinolone as needed. He has not needed antibiotics for Staphylococcal skin disease.  Otherwise, there have been no changes to his past medical history, surgical history, family history, or social history.  Assessment and Plan:  Icker is a 69 y.o. male with:  Moderate persistent asthma, uncomplicated- with eosinophilic phenotype  Seasonal and perennial allergic rhinitis(ragweed, mixed feath, mouse, trees, weeds, grasses, indoor molds, outdoor molds, dust mites, cat, dog and cockroach)  Intrinsic atopic dermatitis  SOB - ? cardiac etiology   1. Moderate persistent asthma, uncomplicated - Refill for Breztri provided today. - Hopefully it is covered, but  call us if there are problems.  - Daily controller medication(s): Breztri two puffs twice daily with spacer - Prior to physical activity: ProAir 2 puffs 10-15 minutes before physical activity. - Rescue medications: ProAir 4 puffs every 4-6 hours as needed - Asthma control goals:  * Full participation in all desired activities (may need albuterol before activity) * Albuterol use two time or less a week on average (not counting use with activity) * Cough interfering with sleep two time or less a month * Oral steroids no more than once a year * No hospitalizations  2. Seasonal and perennial allergic rhinitis (ragweed, mixed feath, mouse, trees, weeds, grasses, indoor molds, outdoor molds, dust mites, cat, dog and cockroach)  - Continue with: Zyrtec (cetirizine) 20m tablet once daily, Flonase (fluticasone) two sprays per nostril daily as needed and Astelin (azelastine) 2 sprays per nostril 1-2 times daily as needed - Consider nasal saline rinses 1-2 times daily to remove allergens from the nasal cavities as well as help with mucous clearance (this is especially helpful to do before the nasal sprays are given)  - We are not going to restart shots today.  3. Intrinsic atopic dermatitis - Continue with triamcinolone 0.1% ointment twice daily as needed to the worst areas.  - Continue with moisturizing twice daily.  4. Follow up in three months or earlier if needed. This can be an in-person, a virtual Webex or a telephone follow up visit.  Diagnostics: None.  Medication List:  Current Outpatient Medications  Medication Sig Dispense Refill  . albuterol (PROVENTIL) (2.5 MG/3ML) 0.083% nebulizer solution Take 3 mLs (2.5 mg total) by nebulization every 6 (six) hours as needed for wheezing or shortness of breath. 150 mL 1  . albuterol (VENTOLIN HFA) 108 (90 Base) MCG/ACT inhaler USE 2 PUFFS EVERY 6 HOURS AS NEEDED 18 g 1  . amLODipine (NORVASC) 5 MG tablet Take 5 mg by mouth daily.    .Marland Kitchenazelastine  (ASTELIN) 0.1 % nasal spray 2 sprays per nostril 1-2 times daily as needed. 30 mL  5  . Blood Glucose Monitoring Suppl (ONETOUCH VERIO) w/Device KIT 1 each 3 (three) times daily by Does not apply route. 1 kit 0  . Budeson-Glycopyrrol-Formoterol (BREZTRI AEROSPHERE) 160-9-4.8 MCG/ACT AERO Inhale 2 puffs into the lungs 2 (two) times daily. 10.7 g 5  . budesonide-formoterol (SYMBICORT) 160-4.5 MCG/ACT inhaler Inhale 2 puffs into the lungs 2 (two) times daily. 1 Inhaler 5  . cetirizine (ZYRTEC) 10 MG tablet Take 1 tablet (10 mg total) by mouth daily. 30 tablet 6  . EPINEPHrine 0.3 mg/0.3 mL IJ SOAJ injection SMARTSIG:1 Pre-Filled Pen Syringe IM As Directed    . famotidine (PEPCID) 20 MG tablet One at bedtime    . fluticasone (FLONASE) 50 MCG/ACT nasal spray Place 2 sprays into both nostrils daily. (Patient taking differently: Place 2 sprays into both nostrils daily. Uses as needed.) 16 g 6  . glucose blood (ONETOUCH VERIO) test strip 1 each 3 (three) times daily by Other route. Use as instructed 100 each 12  . hydrochlorothiazide (HYDRODIURIL) 25 MG tablet     . irbesartan (AVAPRO) 150 MG tablet Take 1 tablet (150 mg total) by mouth daily. 90 tablet 1  . metFORMIN (GLUCOPHAGE) 500 MG tablet TAKE 1 TABLET EVERY MORNING WITH BREAKFAST 90 tablet 1  . omeprazole (PRILOSEC) 20 MG capsule Take 1 capsule (20 mg total) by mouth daily. 30 capsule 5  . ONETOUCH DELICA LANCETS FINE MISC 1 each 3 (three) times daily by Does not apply route. 100 each 12  . OVER THE COUNTER MEDICATION daily. Medication to help urine flow and frequency -? Name    . pravastatin (PRAVACHOL) 40 MG tablet Take 1 tablet (40 mg total) by mouth daily. 90 tablet 3  . SPIRIVA HANDIHALER 18 MCG inhalation capsule 1 capsule daily.     Current Facility-Administered Medications  Medication Dose Route Frequency Provider Last Rate Last Admin  . Benralizumab SOSY 30 mg  30 mg Subcutaneous Q28 days Valentina Shaggy, MD   30 mg at 03/24/18 0930     Allergies: No Known Allergies I reviewed his past medical history, social history, family history, and environmental history and no significant changes have been reported from previous visits.  Review of Systems  Constitutional: Negative for chills, diaphoresis, fatigue and fever.  HENT: Negative for congestion, ear discharge, ear pain, facial swelling, postnasal drip, rhinorrhea, sinus pressure, sinus pain and sore throat.   Eyes: Negative for pain, discharge and itching.  Respiratory: Positive for shortness of breath. Negative for apnea, cough, chest tightness, wheezing and stridor.   Cardiovascular: Negative for chest pain.  Gastrointestinal: Negative for diarrhea and nausea.  Endocrine: Negative for cold intolerance, heat intolerance, polydipsia and polyphagia.  Musculoskeletal: Negative for arthralgias and myalgias.  Skin: Negative for rash.  Allergic/Immunologic: Negative for environmental allergies and food allergies.    Objective:  Physical exam not obtained as encounter was done via telephone.   Previous notes and tests were reviewed.  I discussed the assessment and treatment plan with the patient. The patient was provided an opportunity to ask questions and all were answered. The patient agreed with the plan and demonstrated an understanding of the instructions.   The patient was advised to call back or seek an in-person evaluation if the symptoms worsen or if the condition fails to improve as anticipated.  I provided 20 minutes of non-face-to-face time during this encounter.  It was my pleasure to participate in [place] care today. Please feel free to contact me with any questions or concerns.  Sincerely,  Valentina Shaggy, MD

## 2019-07-09 ENCOUNTER — Encounter: Payer: Self-pay | Admitting: Allergy & Immunology

## 2019-07-19 ENCOUNTER — Ambulatory Visit: Payer: Medicare Other | Admitting: Podiatry

## 2019-08-26 ENCOUNTER — Ambulatory Visit: Payer: Medicare Other | Admitting: Allergy & Immunology

## 2019-08-26 ENCOUNTER — Encounter: Payer: Self-pay | Admitting: Allergy & Immunology

## 2019-08-26 ENCOUNTER — Other Ambulatory Visit: Payer: Self-pay

## 2019-08-26 VITALS — BP 134/82 | HR 68 | Temp 98.2°F | Resp 17

## 2019-08-26 DIAGNOSIS — L2084 Intrinsic (allergic) eczema: Secondary | ICD-10-CM

## 2019-08-26 DIAGNOSIS — J302 Other seasonal allergic rhinitis: Secondary | ICD-10-CM

## 2019-08-26 DIAGNOSIS — J4540 Moderate persistent asthma, uncomplicated: Secondary | ICD-10-CM

## 2019-08-26 DIAGNOSIS — J3089 Other allergic rhinitis: Secondary | ICD-10-CM

## 2019-08-26 NOTE — Patient Instructions (Addendum)
1. Moderate persistent asthma, uncomplicated - Lung testing looks worse today, but it did improve with the inhaler treatment that we gave you in clinic. - Start the prednisone pack provided today. - We will change you from [facility] to Symbicort since you seemed to think that this worked better.  - Daily controller medication(s): Symbicort 160/4.5 two puffs twice daily with spacer - Prior to physical activity: ProAir 2 puffs 10-15 minutes before physical activity. - Rescue medications: ProAir 4 puffs every 4-6 hours as needed - Asthma control goals:  * Full participation in all desired activities (may need albuterol before activity) * Albuterol use two time or less a week on average (not counting use with activity) * Cough interfering with sleep two time or less a month * Oral steroids no more than once a year * No hospitalizations  2. Seasonal and perennial allergic rhinitis (ragweed, mixed feath, mouse, trees, weeds, grasses, indoor molds, outdoor molds, dust mites, cat, dog and cockroach)  - Continue with: Zyrtec (cetirizine) 10mg  tablet once daily, Flonase (fluticasone) two sprays per nostril daily as needed and Astelin (azelastine) 2 sprays per nostril 1-2 times daily as needed - Add on: Singulair (montelukast) 10mg  at night - Consider nasal saline rinses 1-2 times daily to remove allergens from the nasal cavities as well as help with mucous clearance (this is especially helpful to do before the nasal sprays are given)   3. Intrinsic atopic dermatitis - Continue with triamcinolone 0.1% ointment twice daily as needed to the worst areas.  - Continue with moisturizing twice daily.  4. Follow up in four months or earlier if needed This can be an in-person, a virtual Webex or a telephone follow up visit.   Please inform us of any Emergency Department visits, hospitalizations, or changes in symptoms. Call us before going to the ED for breathing or allergy symptoms since we might be able to fit  you in for a sick visit. Feel free to contact us anytime with any questions, problems, or concerns.  It was a pleasure to talk to you today today!  Websites that have reliable patient information: 1. American Academy of Asthma, Allergy, and Immunology: www.aaaai.org 2. Food Allergy Research and Education (FARE): foodallergy.org 3. Mothers of Asthmatics: http://www.asthmacommunitynetwork.org 4. American College of Allergy, Asthma, and Immunology: www.acaai.org   COVID-19 Vaccine Information can be found at: ShippingScam.co.uk For questions related to vaccine distribution or appointments, please email [email or web address] or call [phone].     "Like" [place] on Facebook and Instagram for our latest updates!        Make sure you are registered to vote! If you have moved or changed any of your contact information, you will need to get this updated before voting!  In some cases, you MAY be able to register to vote online: [email or web address]

## 2019-08-26 NOTE — Progress Notes (Signed)
Nicholas Burns UP  Date of Service/Encounter:  08/26/19   Assessment:   Moderate persistent asthma, uncomplicated- with eosinophilic phenotype  Seasonal and perennial allergic rhinitis(ragweed, mixed feath, mouse, trees, weeds, grasses, indoor molds, outdoor molds, dust mites, cat, dog and cockroach)  Intrinsic atopic dermatitis  Plan/Recommendations:   1. Moderate persistent asthma, uncomplicated - Lung testing looks worse today, but it did improve with the inhaler treatment that we gave you in clinic. - Start the prednisone pack provided today. - We will change you from [facility] to Symbicort since you seemed to think that this worked better.  - Daily controller medication(s): Symbicort 160/4.5 two puffs twice daily with spacer - Prior to physical activity: ProAir 2 puffs 10-15 minutes before physical activity. - Rescue medications: ProAir 4 puffs every 4-6 hours as needed - Asthma control goals:  * Full participation in all desired activities (may need albuterol before activity) * Albuterol use two time or less a week on average (not counting use with activity) * Cough interfering with sleep two time or less a month * Oral steroids no more than once a year * No hospitalizations  2. Seasonal and perennial allergic rhinitis (ragweed, mixed feath, mouse, trees, weeds, grasses, indoor molds, outdoor molds, dust mites, cat, dog and cockroach)  - Continue with: Zyrtec (cetirizine) 10mg  tablet once daily, Flonase (fluticasone) two sprays per nostril daily as needed and Astelin (azelastine) 2 sprays per nostril 1-2 times daily as needed - Add on: Singulair (montelukast) 10mg  at night - Consider nasal saline rinses 1-2 times daily to remove allergens from the nasal cavities as well as help with mucous clearance (this is especially helpful to do before the nasal sprays are given)   3. Intrinsic atopic dermatitis - Continue with triamcinolone 0.1% ointment twice daily as needed to the  worst areas.  - Continue with moisturizing twice daily.  4. Follow up in four months or earlier if needed This can be an in-person, a virtual Webex or a telephone follow up visit.   Subjective:   Nicholas Burns is a 69 y.o. male presenting today for follow up of  Chief Complaint  Patient presents with   Asthma    Nicholas Burns has a history of the following: Patient Active Problem List   Diagnosis Date Noted   Intrinsic atopic dermatitis 10/22/2018   Seasonal and perennial allergic rhinitis 07/21/2018   Moderate persistent asthma without complication [id number]   Morbid obesity due to excess calories ([place])  complicated by dm/ hbp, hyperlipidemia 03/18/2017   SOB (shortness of breath) 01/26/2017   Essential hypertension 09/13/2015   Asthma 09/30/2013   Poor dentition 09/30/2013    History obtained from: chart review and patient.  Nicholas Burns is a 69 y.o. male presenting for a follow up visit. He was last seen in February 2021. At that time, we refilled his Breztri two puffs BID with albuterol as needed for flares. For his P/SAR, we continued with cetirizine as well as fluticasone and Astelin. Atopic dermatitis was controlled with triamcinolone ointment twice daily as needed in combination with moisturizing twice daily.   Since the last visit, he tells me that his SOB has worsened over the course of the night. He does have some reflux medication. He thinks that this has particularly worsened since changing to Nicholas Burns, which he told me was actually working better at the last visit compared to Symbicort. He would like to change back to Symbicort.   Asthma/Respiratory Symptom History: He does have the [place] with him  today and he tells me that he wants to change back to the Symbicort. He has had some problems with shortness of breath at night. He has not had any ED visits or steroid courses since the last visit. He has been using his rescue inhaler intermittently, although it is hard  to get a good idea how bad his symptoms are.   Allergic Rhinitis Symptom History: He has not been on antibiotics at all since the last visit. He is not regularly taking nose sprays at all. But he is taking his pills and requests refills of these. He tells me that he has plenty of the nose sprays.   Eczema Symptom History: Skin is well controlled at this time. He does report some itching. He has not needed antibiotics or steroids at all.   Otherwise, there have been no changes to his past medical history, surgical history, family history, or social history.    Review of Systems  Constitutional: Negative.  Negative for chills, fever, malaise/fatigue and weight loss.  HENT: Negative.  Negative for congestion, ear discharge and ear pain.   Eyes: Negative for pain, discharge and redness.  Respiratory: Negative for cough, sputum production, shortness of breath and wheezing.   Cardiovascular: Negative.  Negative for chest pain and palpitations.  Gastrointestinal: Negative for abdominal pain, constipation, diarrhea, heartburn, nausea and vomiting.  Skin: Negative.  Negative for itching and rash.  Neurological: Negative for dizziness and headaches.  Endo/Heme/Allergies: Negative for environmental allergies. Does not bruise/bleed easily.       Objective:   Blood pressure 134/82, pulse 68, temperature 98.2 F (36.8 C), temperature source Temporal, resp. rate 17, SpO2 98 %. There is no height or weight on file to calculate BMI.   Physical Exam:  Physical Exam  Constitutional: He appears well-developed.  Flat affect.  HENT:  Head: Normocephalic and atraumatic.  Right Ear: Tympanic membrane, external ear and ear canal normal.  Left Ear: Tympanic membrane, external ear and ear canal normal.  Nose: Mucosal edema and rhinorrhea present. No nasal deformity or septal deviation. No epistaxis. Right sinus exhibits no maxillary sinus tenderness and no frontal sinus tenderness. Left sinus exhibits  no maxillary sinus tenderness and no frontal sinus tenderness.  Mouth/Throat: Uvula is midline and oropharynx is clear and moist. Mucous membranes are not pale and not dry.  Normal appearing turbinates with clear discharge. No polyps appreciated.  Eyes: Pupils are equal, round, and reactive to light. Conjunctivae and EOM are normal. Right eye exhibits no chemosis and no discharge. Left eye exhibits no chemosis and no discharge. Right conjunctiva is not injected. Left conjunctiva is not injected.  Cardiovascular: Normal rate, regular rhythm and normal heart sounds.  Respiratory: Effort normal and breath sounds normal. No accessory muscle usage. No tachypnea. No respiratory distress. He has no wheezes. He has no rhonchi. He has no rales. He exhibits no tenderness.  Decreased air movement at the bases.   Lymphadenopathy:    He has no cervical adenopathy.  Neurological: He is alert.  Skin: No abrasion, no petechiae and no rash noted. Rash is not papular, not vesicular and not urticarial. No erythema. No pallor.  No eczematous or urticarial lesions noted.   Psychiatric: He has a normal mood and affect.     Diagnostic studies:    Spirometry: results abnormal (FEV1: 1.41/50%, FVC: 1.79/48%, FEV1/FVC: 79%).    Spirometry consistent with possible restrictive disease.  4 puffs of Xopenex given in the clinic with marked improvement.  His FEV1 increased from  1.41-1.79.  His FVC increased from 1.79-2.28.  This was a 27% improvement in both values.  Allergy Studies: none        Salvatore Marvel, MD  [facility]

## 2019-08-27 MED ORDER — MONTELUKAST SODIUM 10 MG PO TABS: 10.0000 mg | ORAL_TABLET | Freq: Every day | ORAL | 5 refills | Status: DC

## 2019-08-27 MED ORDER — BUDESONIDE-FORMOTEROL FUMARATE 160-4.5 MCG/ACT IN AERO: 2.0000 | INHALATION_SPRAY | Freq: Two times a day (BID) | RESPIRATORY_TRACT | 5 refills | Status: DC

## 2019-08-28 ENCOUNTER — Encounter: Payer: Self-pay | Admitting: Allergy & Immunology

## 2019-09-15 ENCOUNTER — Other Ambulatory Visit: Payer: Self-pay | Admitting: *Deleted

## 2019-09-15 DIAGNOSIS — I10 Essential (primary) hypertension: Secondary | ICD-10-CM

## 2019-09-28 ENCOUNTER — Other Ambulatory Visit: Payer: Self-pay

## 2019-09-28 ENCOUNTER — Ambulatory Visit (INDEPENDENT_AMBULATORY_CARE_PROVIDER_SITE_OTHER): Payer: Medicare Other | Admitting: Podiatry

## 2019-09-28 ENCOUNTER — Encounter: Payer: Self-pay | Admitting: Podiatry

## 2019-09-28 VITALS — Temp 96.7°F

## 2019-09-28 DIAGNOSIS — E1140 Type 2 diabetes mellitus with diabetic neuropathy, unspecified: Secondary | ICD-10-CM | POA: Diagnosis not present

## 2019-09-28 DIAGNOSIS — M79675 Pain in left toe(s): Secondary | ICD-10-CM

## 2019-09-28 DIAGNOSIS — M79674 Pain in right toe(s): Secondary | ICD-10-CM

## 2019-09-28 DIAGNOSIS — E1149 Type 2 diabetes mellitus with other diabetic neurological complication: Secondary | ICD-10-CM | POA: Diagnosis not present

## 2019-09-28 DIAGNOSIS — B351 Tinea unguium: Secondary | ICD-10-CM | POA: Diagnosis not present

## 2019-09-28 NOTE — Progress Notes (Signed)
This patient presents to the office with chief complaint of long thick painful nails.  Patient says the nails are painful walking and wearing shoes.  This patient is unable to self treat.  This patient is unable to trim his nails since she is unable to reach his nails.  She presents to the office for preventative foot care services.  General Appearance  Alert, conversant and in no acute stress.  Vascular  Dorsalis pedis and posterior tibial  pulses are palpable  bilaterally.  Capillary return is within normal limits  bilaterally. Temperature is within normal limits  bilaterally.  Neurologic  Senn-Weinstein monofilament wire test within normal limits  bilaterally. Muscle power within normal limits bilaterally.  Nails Thick disfigured discolored nails with subungual debris  from hallux to fifth toes bilaterally. No evidence of bacterial infection or drainage bilaterally.  Orthopedic  No limitations of motion  feet .  No crepitus or effusions noted.  No bony pathology or digital deformities noted.  Skin  normotropic skin with no porokeratosis noted bilaterally.  No signs of infections or ulcers noted.     Onychomycosis  Nails  B/L.  Pain in right toes  Pain in left toes  Debridement of nails both feet followed trimming the nails with dremel tool.    RTC 3 months.   Herve Haug DPM   

## 2019-10-03 ENCOUNTER — Other Ambulatory Visit: Payer: Self-pay | Admitting: *Deleted

## 2019-10-03 DIAGNOSIS — I10 Essential (primary) hypertension: Secondary | ICD-10-CM

## 2019-10-03 NOTE — Telephone Encounter (Signed)
FYI  For provider:    Patient says he has changed provider's offices.  He was made aware to let his pharmacy know so that [facility] will not receive refill request.

## 2019-10-03 NOTE — Telephone Encounter (Signed)
[facility] NTBS LOV 03/25/18

## 2019-12-21 ENCOUNTER — Other Ambulatory Visit (HOSPITAL_COMMUNITY): Payer: Self-pay | Admitting: Family Medicine

## 2019-12-21 DIAGNOSIS — E118 Type 2 diabetes mellitus with unspecified complications: Secondary | ICD-10-CM

## 2019-12-21 DIAGNOSIS — R0609 Other forms of dyspnea: Secondary | ICD-10-CM

## 2019-12-21 DIAGNOSIS — J4540 Moderate persistent asthma, uncomplicated: Secondary | ICD-10-CM

## 2019-12-21 DIAGNOSIS — R0600 Dyspnea, unspecified: Secondary | ICD-10-CM

## 2019-12-22 ENCOUNTER — Encounter (HOSPITAL_COMMUNITY): Payer: Self-pay | Admitting: Family Medicine

## 2019-12-28 ENCOUNTER — Telehealth (HOSPITAL_COMMUNITY): Payer: Self-pay | Admitting: *Deleted

## 2019-12-28 ENCOUNTER — Ambulatory Visit: Payer: Medicare Other | Admitting: Allergy & Immunology

## 2019-12-28 ENCOUNTER — Telehealth: Payer: Self-pay | Admitting: Cardiology

## 2019-12-28 NOTE — Telephone Encounter (Signed)
     I went in pt's chart to see who had given him instructions for his Stress test.

## 2019-12-28 NOTE — Telephone Encounter (Signed)
Patient given detailed instructions per Myocardial Perfusion Study Information Sheet for the test on 01/03/20. Patient notified to arrive 15 minutes early and that it is imperative to arrive on time for appointment to keep from having the test rescheduled. ° If you need to cancel or reschedule your appointment, please call the office within 24 hours of your appointment. . Patient verbalized understanding. Nicholas Burns Jacqueline ° ° ° °

## 2020-01-02 NOTE — Telephone Encounter (Signed)
Patient's son is requesting to discuss instructions for Myocardial Perfusion scheduled for 01/03/20. Please call.

## 2020-01-03 ENCOUNTER — Other Ambulatory Visit: Payer: Self-pay

## 2020-01-03 ENCOUNTER — Ambulatory Visit (HOSPITAL_COMMUNITY): Payer: Medicare Other | Attending: Cardiology

## 2020-01-03 DIAGNOSIS — R0609 Other forms of dyspnea: Secondary | ICD-10-CM

## 2020-01-03 DIAGNOSIS — R0600 Dyspnea, unspecified: Secondary | ICD-10-CM | POA: Insufficient documentation

## 2020-01-03 DIAGNOSIS — E118 Type 2 diabetes mellitus with unspecified complications: Secondary | ICD-10-CM | POA: Diagnosis not present

## 2020-01-03 DIAGNOSIS — J4540 Moderate persistent asthma, uncomplicated: Secondary | ICD-10-CM | POA: Insufficient documentation

## 2020-01-03 LAB — MYOCARDIAL PERFUSION IMAGING
LV dias vol: 77 mL (ref 62–150)
LV sys vol: 38 mL
Peak HR: 93 {beats}/min
Rest HR: 54 {beats}/min
SDS: 0
SRS: 0
SSS: 0
TID: 1.05

## 2020-01-03 MED ORDER — TECHNETIUM TC 99M TETROFOSMIN IV KIT
30.6000 | PACK | Freq: Once | INTRAVENOUS | Status: AC | PRN
  Administered 2020-01-03: 30.6 via INTRAVENOUS
  Filled 2020-01-03: qty 31

## 2020-01-03 MED ORDER — REGADENOSON 0.4 MG/5ML IV SOLN
0.4000 mg | Freq: Once | INTRAVENOUS | Status: AC
  Administered 2020-01-03: 0.4 mg via INTRAVENOUS

## 2020-01-03 MED ORDER — TECHNETIUM TC 99M TETROFOSMIN IV KIT
10.5000 | PACK | Freq: Once | INTRAVENOUS | Status: AC | PRN
  Administered 2020-01-03: 10.5 via INTRAVENOUS
  Filled 2020-01-03: qty 11

## 2020-01-04 ENCOUNTER — Ambulatory Visit: Payer: Medicare Other | Admitting: Podiatry

## 2020-01-13 ENCOUNTER — Encounter: Payer: Self-pay | Admitting: Allergy & Immunology

## 2020-01-13 ENCOUNTER — Other Ambulatory Visit: Payer: Self-pay

## 2020-01-13 ENCOUNTER — Ambulatory Visit (INDEPENDENT_AMBULATORY_CARE_PROVIDER_SITE_OTHER): Payer: Medicare Other | Admitting: Allergy & Immunology

## 2020-01-13 DIAGNOSIS — L2084 Intrinsic (allergic) eczema: Secondary | ICD-10-CM

## 2020-01-13 DIAGNOSIS — J3089 Other allergic rhinitis: Secondary | ICD-10-CM

## 2020-01-13 DIAGNOSIS — J4540 Moderate persistent asthma, uncomplicated: Secondary | ICD-10-CM | POA: Diagnosis not present

## 2020-01-13 DIAGNOSIS — J302 Other seasonal allergic rhinitis: Secondary | ICD-10-CM

## 2020-01-13 NOTE — Progress Notes (Signed)
RE: Nicholas Burns MRN: [id number] DOB: 07-Apr-1951 Date of Telemedicine Visit: 01/13/2020  Referring provider: Sharion Balloon, FNP Primary care provider: Sharion Balloon, FNP  Chief Complaint: Asthma   Telemedicine Follow Up Visit via Telephone: I connected with Nicholas Burns for a follow up on 01/13/20 by telephone and verified that I am speaking with the correct person using two identifiers.   I discussed the limitations, risks, security and privacy concerns of performing an evaluation and management service by telephone and the availability of in person appointments. I also discussed with the patient that there may be a patient responsible charge related to this service. The patient expressed understanding and agreed to proceed.  Patient is at home.  Provider is at the office.  Visit start time: 3:17 PM Visit end time: 3:41 PM Insurance consent/check in by: [facility] consent and medical assistant/nurse: Kayla  History of Present Illness:  He is a 69 y.o. male, who is being followed for moderate persistent asthma as well as seasonal and perennial allergic rhinitis and atopic dermatitis. His previous allergy office visit was in April 2021 with myself.  At that time, lung testing looked worse, but it did improve with the albuterol treatment.  We started him on a prednisone Dosepak.  We changed him from [place] to Symbicort because he thought that it worked better.  We did 2 puffs twice daily in combination with ProAir as needed.  For his allergic rhinitis, we continue with Zyrtec, Flonase, and Astelin.  We also added on Singulair.  Atopic dermatitis was controlled with triamcinolone 0.1% ointment twice daily.  Since last visit, he reports that he has done well.  He has made a few words, however, and is difficult to read especially over the phone. He tells me that everybody has some down with COVID in his family.  He has not so far.  Asthma/Respiratory Symptom History: It is unclear  what he is on.  He does mention Symbicort, but does not tell me how often he is using it.  He has not needed prednisone. He reports that he coughs up the white sputum every morning.  He has had no fever with this.  He thinks he has had a recent chest x-ray that has been normal.  He has not been on antibiotics.  Allergic Rhinitis Symptom History: He thinks he is using Zyrtec and the Singulair.  He does not use the nose sprays at all.  He has not needed antibiotics at all since last visit.  Eczema Symptom Symptom History: His skin is under good control.  He is not using a topical steroid very often.   Otherwise, there have been no changes to his past medical history, surgical history, family history, or social history.  Assessment and Plan:  Nicholas Burns is a 69 y.o. male with:  Moderate persistent asthma, uncomplicated- with eosinophilic phenotype  Seasonal and perennial allergic rhinitis(ragweed, mixed feath, mouse, trees, weeds, grasses, indoor molds, outdoor molds, dust mites, cat, dog and cockroach)  Intrinsic atopic dermatitis   Nicholas Burns seems to be doing well, at least per my telephone visit.  He has not had Covid like the rest of his family, which is fortunate.  It is unclear if he is compliant with his medications at all, as his history is fairly terrible.  We are going to see him again in 4 months just to make sure he is doing okay.  I do not think there is any reason to make any changes at this  point in time.  Diagnostics: None.  Medication List:  Current Outpatient Medications  Medication Sig Dispense Refill  . albuterol (PROVENTIL) (2.5 MG/3ML) 0.083% nebulizer solution Take 3 mLs (2.5 mg total) by nebulization every 6 (six) hours as needed for wheezing or shortness of breath. 150 mL 1  . albuterol (VENTOLIN HFA) 108 (90 Base) MCG/ACT inhaler USE 2 PUFFS EVERY 6 HOURS AS NEEDED 18 g 1  . amLODipine (NORVASC) 5 MG tablet Take 5 mg by mouth daily.    Marland Kitchen azelastine (ASTELIN) 0.1  % nasal spray 2 sprays per nostril 1-2 times daily as needed. 30 mL 5  . Blood Glucose Monitoring Suppl (ONETOUCH VERIO) w/Device KIT 1 each 3 (three) times daily by Does not apply route. 1 kit 0  . Budeson-Glycopyrrol-Formoterol (BREZTRI AEROSPHERE) 160-9-4.8 MCG/ACT AERO Inhale 2 puffs into the lungs 2 (two) times daily. 10.7 g 5  . budesonide-formoterol (SYMBICORT) 160-4.5 MCG/ACT inhaler Inhale 2 puffs into the lungs 2 (two) times daily. 1 Inhaler 5  . cetirizine (ZYRTEC) 10 MG tablet Take 1 tablet (10 mg total) by mouth daily. 30 tablet 6  . EPINEPHrine 0.3 mg/0.3 mL IJ SOAJ injection SMARTSIG:1 Pre-Filled Pen Syringe IM As Directed    . famotidine (PEPCID) 20 MG tablet One at bedtime    . fluticasone (FLONASE) 50 MCG/ACT nasal spray Place 2 sprays into both nostrils daily. (Patient taking differently: Place 2 sprays into both nostrils daily. Uses as needed.) 16 g 6  . glipiZIDE (GLUCOTROL) 5 MG tablet Take 5 mg by mouth daily.    Marland Kitchen glucose blood (ONETOUCH VERIO) test strip 1 each 3 (three) times daily by Other route. Use as instructed 100 each 12  . hydrochlorothiazide (HYDRODIURIL) 25 MG tablet     . irbesartan (AVAPRO) 150 MG tablet Take 1 tablet (150 mg total) by mouth daily. 90 tablet 1  . metFORMIN (GLUCOPHAGE) 1000 MG tablet Take 1,000 mg by mouth 2 (two) times daily.    . metFORMIN (GLUCOPHAGE) 500 MG tablet TAKE 1 TABLET EVERY MORNING WITH BREAKFAST 90 tablet 1  . montelukast (SINGULAIR) 10 MG tablet Take 1 tablet (10 mg total) by mouth at bedtime. 30 tablet 5  . omeprazole (PRILOSEC) 20 MG capsule Take 1 capsule (20 mg total) by mouth daily. 30 capsule 5  . ONETOUCH DELICA LANCETS FINE MISC 1 each 3 (three) times daily by Does not apply route. 100 each 12  . OVER THE COUNTER MEDICATION daily. Medication to help urine flow and frequency -? Name    . PATADAY 0.1 % ophthalmic solution SMARTSIG:Insert In Eye(s) Twice Daily    . pravastatin (PRAVACHOL) 40 MG tablet Take 1 tablet (40 mg  total) by mouth daily. 90 tablet 3  . SPIRIVA HANDIHALER 18 MCG inhalation capsule 1 capsule daily.    Marland Kitchen triamcinolone cream (KENALOG) 0.1 % SMARTSIG:1 Sparingly Topical Twice Daily     Current Facility-Administered Medications  Medication Dose Route Frequency Provider Last Rate Last Admin  . Benralizumab SOSY 30 mg  30 mg Subcutaneous Q28 days Valentina Shaggy, MD   30 mg at 03/24/18 0930   Allergies: No Known Allergies I reviewed his past medical history, social history, family history, and environmental history and no significant changes have been reported from previous visits.  Review of Systems  Constitutional: Negative for chills, diaphoresis, fatigue and fever.  HENT: Negative for congestion, ear discharge, ear pain, facial swelling, postnasal drip, rhinorrhea, sinus pressure, sinus pain and sore throat.   Eyes: Negative for pain,  discharge and itching.  Respiratory: Negative for apnea, cough, chest tightness and shortness of breath.   Cardiovascular: Negative for chest pain.  Gastrointestinal: Negative for diarrhea and nausea.  Musculoskeletal: Negative for arthralgias and myalgias.  Skin: Negative for rash.  Allergic/Immunologic: Negative for environmental allergies and food allergies.    Objective:  Physical exam not obtained as encounter was done via telephone.   Previous notes and tests were reviewed.  I discussed the assessment and treatment plan with the patient. The patient was provided an opportunity to ask questions and all were answered. The patient agreed with the plan and demonstrated an understanding of the instructions.   The patient was advised to call back or seek an in-person evaluation if the symptoms worsen or if the condition fails to improve as anticipated.  I provided 24 minutes of non-face-to-face time during this encounter.  It was my pleasure to participate in [place] care today. Please feel free to contact me with any questions or  concerns.   Sincerely,  Valentina Shaggy, MD

## 2020-01-13 NOTE — Patient Instructions (Addendum)
1. Moderate persistent asthma,uncomplicated - Lung testing looks worse today, but it did improve with the inhaler treatment that we gave you in clinic. - Start the prednisone pack provided today.  - We will change you from [facility] to Symbicort since you seemed to think that this worked better.  - Daily controller medication(s): Symbicort 160/4.5 two puffs twice daily with spacer - Prior to physical activity: ProAir 2 puffs 10-15 minutes before physical activity. - Rescue medications: ProAir 4 puffs every 4-6 hours as needed - Asthma control goals:  * Full participation in all desired activities (may need albuterol before activity) * Albuterol use two time or less a week on average (not counting use with activity) * Cough interfering with sleep two time or less a month * Oral steroids no more than once a year * No hospitalizations  2. Seasonal and perennial allergic rhinitis (ragweed, mixed feath, mouse, trees, weeds, grasses, indoor molds, outdoor molds, dust mites, cat, dog and cockroach)  - Continue with: Zyrtec (cetirizine) 10mg  tablet once daily, Flonase (fluticasone) two sprays per nostril daily as needed and Astelin (azelastine) 2 sprays per nostril 1-2 times daily as needed - Add on: Singulair (montelukast) 10mg  at night - Consider nasal saline rinses 1-2 times daily to remove allergens from the nasal cavities as well as help with mucous clearance (this is especially helpful to do before the nasal sprays are given)   3. Intrinsic atopic dermatitis - Continue with triamcinolone 0.1% ointment twice daily as needed to the worst areas.  - Continue with moisturizing twice daily.  4. No follow-ups on file.    Please inform us of any Emergency Department visits, hospitalizations, or changes in symptoms. Call us before going to the ED for breathing or allergy symptoms since we might be able to fit you in for a sick visit. Feel free to contact us anytime with any questions, problems, or  concerns.  It was a pleasure to talk to you today today!  Websites that have reliable patient information: 1. American Academy of Asthma, Allergy, and Immunology: www.aaaai.org 2. Food Allergy Research and Education (FARE): foodallergy.org 3. Mothers of Asthmatics: http://www.asthmacommunitynetwork.org 4. American College of Allergy, Asthma, and Immunology: www.acaai.org   COVID-19 Vaccine Information can be found at: ShippingScam.co.uk For questions related to vaccine distribution or appointments, please email [email or web address] or call [phone].     "Like" [place] on Facebook and Instagram for our latest updates!        Make sure you are registered to vote! If you have moved or changed any of your contact information, you will need to get this updated before voting!  In some cases, you MAY be able to register to vote online: [email or web address]

## 2020-01-16 ENCOUNTER — Encounter: Payer: Self-pay | Admitting: Allergy & Immunology

## 2020-01-17 ENCOUNTER — Other Ambulatory Visit: Payer: Self-pay

## 2020-01-17 ENCOUNTER — Ambulatory Visit (INDEPENDENT_AMBULATORY_CARE_PROVIDER_SITE_OTHER): Payer: Medicare Other | Admitting: Podiatry

## 2020-01-17 ENCOUNTER — Encounter: Payer: Self-pay | Admitting: Podiatry

## 2020-01-17 DIAGNOSIS — E1140 Type 2 diabetes mellitus with diabetic neuropathy, unspecified: Secondary | ICD-10-CM | POA: Diagnosis not present

## 2020-01-17 DIAGNOSIS — E1149 Type 2 diabetes mellitus with other diabetic neurological complication: Secondary | ICD-10-CM

## 2020-01-17 DIAGNOSIS — B351 Tinea unguium: Secondary | ICD-10-CM | POA: Diagnosis not present

## 2020-01-17 DIAGNOSIS — M79675 Pain in left toe(s): Secondary | ICD-10-CM | POA: Diagnosis not present

## 2020-01-17 DIAGNOSIS — M79674 Pain in right toe(s): Secondary | ICD-10-CM

## 2020-01-17 NOTE — Progress Notes (Signed)
This patient presents to the office with chief complaint of long thick painful nails.  Patient says the nails are painful walking and wearing shoes.  This patient is unable to self treat.  This patient is unable to trim his nails since she is unable to reach his nails.  She presents to the office for preventative foot care services.  General Appearance  Alert, conversant and in no acute stress.  Vascular  Dorsalis pedis and posterior tibial  pulses are palpable  bilaterally.  Capillary return is within normal limits  bilaterally. Temperature is within normal limits  bilaterally.  Neurologic  Senn-Weinstein monofilament wire test within normal limits  bilaterally. Muscle power within normal limits bilaterally.  Nails Thick disfigured discolored nails with subungual debris  from hallux to fifth toes bilaterally. No evidence of bacterial infection or drainage bilaterally.  Orthopedic  No limitations of motion  feet .  No crepitus or effusions noted.  No bony pathology or digital deformities noted.  Skin  normotropic skin with no porokeratosis noted bilaterally.  No signs of infections or ulcers noted.     Onychomycosis  Nails  B/L.  Pain in right toes  Pain in left toes  Debridement of nails both feet followed trimming the nails with dremel tool.    RTC 3 months.   Kennidi Yoshida DPM   

## 2020-03-16 ENCOUNTER — Ambulatory Visit: Payer: Self-pay | Admitting: Allergy & Immunology

## 2020-04-04 ENCOUNTER — Ambulatory Visit: Payer: Medicare Other | Admitting: Podiatry

## 2020-05-22 ENCOUNTER — Ambulatory Visit: Payer: Medicare Other | Admitting: Podiatry

## 2020-05-22 ENCOUNTER — Other Ambulatory Visit: Payer: Self-pay

## 2020-05-22 ENCOUNTER — Encounter: Payer: Self-pay | Admitting: Podiatry

## 2020-05-22 DIAGNOSIS — M2042 Other hammer toe(s) (acquired), left foot: Secondary | ICD-10-CM

## 2020-05-22 DIAGNOSIS — M2041 Other hammer toe(s) (acquired), right foot: Secondary | ICD-10-CM

## 2020-05-22 DIAGNOSIS — B351 Tinea unguium: Secondary | ICD-10-CM | POA: Diagnosis not present

## 2020-05-22 DIAGNOSIS — M79675 Pain in left toe(s): Secondary | ICD-10-CM | POA: Diagnosis not present

## 2020-05-22 DIAGNOSIS — E1140 Type 2 diabetes mellitus with diabetic neuropathy, unspecified: Secondary | ICD-10-CM | POA: Diagnosis not present

## 2020-05-22 DIAGNOSIS — E1149 Type 2 diabetes mellitus with other diabetic neurological complication: Secondary | ICD-10-CM

## 2020-05-22 DIAGNOSIS — M79674 Pain in right toe(s): Secondary | ICD-10-CM

## 2020-05-22 NOTE — Progress Notes (Signed)
This patient presents to the office with chief complaint of long thick painful nails.  Patient says the nails are painful walking and wearing shoes.  This patient is unable to self treat.  This patient is unable to trim his nails since she is unable to reach his nails.  He presents to the office for preventative foot care services.  General Appearance  Alert, conversant and in no acute stress.  Vascular  Dorsalis pedis and posterior tibial  pulses are palpable  bilaterally.  Capillary return is within normal limits  bilaterally. Temperature is within normal limits  bilaterally.  Neurologic  Senn-Weinstein monofilament wire test within normal limits  bilaterally. Muscle power within normal limits bilaterally.  Nails Thick disfigured discolored nails with subungual debris  from hallux to fifth toes bilaterally. No evidence of bacterial infection or drainage bilaterally.  Orthopedic  No limitations of motion  feet .  No crepitus or effusions noted.  Hammer toes  B/L.  Skin  normotropic skin with no porokeratosis noted bilaterally.  No signs of infections or ulcers noted.     Onychomycosis  Nails  B/L.  Pain in right toes  Pain in left toes  Debridement of nails both feet followed trimming the nails with dremel tool.    RTC 3 months.   Gardiner Barefoot DPM

## 2020-07-11 ENCOUNTER — Other Ambulatory Visit: Payer: Self-pay

## 2020-07-11 ENCOUNTER — Ambulatory Visit (INDEPENDENT_AMBULATORY_CARE_PROVIDER_SITE_OTHER): Payer: Medicare Other

## 2020-07-11 DIAGNOSIS — Z23 Encounter for immunization: Secondary | ICD-10-CM | POA: Diagnosis not present

## 2020-07-11 NOTE — Progress Notes (Signed)
   Covid-19 Vaccination Clinic  Name:  Coal Nearhood    MRN: [id number] DOB: 07-03-1950  07/11/2020  Mr. Breit was observed post Covid-19 immunization for 15 minutes without incident. He was provided with Vaccine Information Sheet and instruction to access the V-Safe system.   Mr. Castiglia was instructed to call 911 with any severe reactions post vaccine: Marland Kitchen Difficulty breathing  . Swelling of face and throat  . A fast heartbeat  . A bad rash all over body  . Dizziness and weakness   Immunizations Administered    Name Date Dose VIS Date Route   PFIZER Comrnaty(Gray TOP) Covid-19 Vaccine 07/11/2020  3:16 PM 0.3 mL 04/26/2020 Intramuscular   Manufacturer: [employer]   Lot: [id number]   NDC: [phone]

## 2020-07-24 NOTE — Patient Instructions (Addendum)
1. Moderate persistent asthma,uncomplicated  Stop Symbciort and Spiriva 18 mcg - Daily controller medication(s): Start Breztri two puffs twice daily with spacer. Use this every day - Prior to physical activity: ProAir 2 puffs 10-15 minutes before physical activity. - Rescue medications: ProAir 4 puffs every 4-6 hours as needed - Asthma control goals:  * Full participation in all desired activities (may need albuterol before activity) * Albuterol use two time or less a week on average (not counting use with activity) * Cough interfering with sleep two time or less a month * Oral steroids no more than once a year * No hospitalizations  2. Seasonal and perennial allergic rhinitis (ragweed, mixed feathers, mouse, trees, weeds, grasses, indoor molds, outdoor molds, dust mites, cat, dog and cockroach)  - Continue with: Zyrtec (cetirizine) 10mg  tablet once daily, Flonase (fluticasone) two sprays per nostril daily as needed and Astelin (azelastine) 2 sprays per nostril 1-2 times daily as needed, Singulair (montelukast) 10 mg at night - Consider nasal saline rinses 1-2 times daily to remove allergens from the nasal cavities as well as help with mucous clearance (this is especially helpful to do before the nasal sprays are given)   3. Intrinsic atopic dermatitis - Continue with triamcinolone 0.1% ointment twice daily as needed to the worst areas.  - Continue with moisturizing twice daily.  Schedule a follow up appointment in  2 months

## 2020-07-25 ENCOUNTER — Encounter: Payer: Self-pay | Admitting: Family

## 2020-07-25 ENCOUNTER — Other Ambulatory Visit: Payer: Self-pay

## 2020-07-25 ENCOUNTER — Ambulatory Visit (INDEPENDENT_AMBULATORY_CARE_PROVIDER_SITE_OTHER): Payer: Medicare Other | Admitting: Family

## 2020-07-25 DIAGNOSIS — J302 Other seasonal allergic rhinitis: Secondary | ICD-10-CM

## 2020-07-25 DIAGNOSIS — L2084 Intrinsic (allergic) eczema: Secondary | ICD-10-CM | POA: Diagnosis not present

## 2020-07-25 DIAGNOSIS — J3089 Other allergic rhinitis: Secondary | ICD-10-CM

## 2020-07-25 DIAGNOSIS — J4540 Moderate persistent asthma, uncomplicated: Secondary | ICD-10-CM

## 2020-07-25 MED ORDER — BREZTRI AEROSPHERE 160-9-4.8 MCG/ACT IN AERO: 2.0000 | INHALATION_SPRAY | Freq: Two times a day (BID) | RESPIRATORY_TRACT | 5 refills | Status: DC

## 2020-07-25 NOTE — Progress Notes (Signed)
[place], [address] [place] [address] Dept: [phone]  FOLLOW UP NOTE  Patient ID: Nicholas Burns, male    DOB: Oct 06, 1950  Age: 70 y.o. MRN: [id number] Date of Office Visit: 07/25/2020  Assessment  Chief Complaint: Follow-up  HPI Nicholas Burns is a 70 year old male who presents today for follow-up of moderate persistent asthma, seasonal and perennial allergic rhinitis, and atopic dermatitis.  His son is here with him today.    Moderate persistent asthma is reported as moderately controlled with Spiriva 18 mcg as needed, Symbicort 160/4.5 mcg 2 puffs in the morning and sometimes he will use at night, and Breztri that he will use every now and then.  He reports a productive cough that occurs occasionally with white sputum and shortness of breath that occurs every morning.  He denies any wheezing, tightness in his chest, fever, chills, and nocturnal awakenings.  Since his last office visit he has not required any systemic steroids or made any trips to the emergency room or urgent care due to breathing problems.  He uses his albuterol once every other day.  He has received 3 Pfizer COVID-19 vaccines.  Seasonal and perennial allergic rhinitis is reported as controlled with Zyrtec as needed and he thinks that he is probably taking Singulair every day.  He denies any rhinorrhea, nasal congestion, postnasal drip, and sinus infections since his last office visit.  Eczema is reported as moderately controlled with a medication that he is unsure of the name of.  His last office visit indicates that he should be using triamcinolone 0.1% ointment twice a day as needed.  He reports that right now he has an area on his right lower leg and triamcinolone is helping.   Drug Allergies:  No Known Allergies  Review of Systems: Review of Systems  Constitutional: Negative for chills and fever.  HENT:       Denies rhinorrhea, nasal congestion, postnasal drip, and any sinus infections since we  last saw him  Eyes:       Denies itchy watery eyes  Respiratory: Positive for cough and shortness of breath. Negative for wheezing.        Reports occasional productive cough with white sputum and shortness of breath every morning  Cardiovascular: Negative for chest pain and palpitations.  Gastrointestinal: Negative for abdominal pain and heartburn.  Genitourinary: Negative for dysuria.  Skin: Positive for itching. Negative for rash.       Reports itching due to atopic dermatitis  Neurological: Positive for headaches.       Reports occasional headaches  Endo/Heme/Allergies: Positive for environmental allergies.    Physical Exam: BP 112/72   Pulse 69   Temp 98 F (36.7 C) (Temporal)   Resp 14   SpO2 97%    Physical Exam Constitutional:      Appearance: Normal appearance.  HENT:     Head: Normocephalic and atraumatic.     Comments: Pharynx normal, eyes normal, nose bilateral turbinate mildly edematous and slightly erythematous with no drainage noted.  Unable to visualize left tympanic membrane due to cerumen.  Right ear normal    Right Ear: Tympanic membrane, ear canal and external ear normal.     Left Ear: Ear canal and external ear normal.     Mouth/Throat:     Mouth: Mucous membranes are moist.     Pharynx: Oropharynx is clear.  Eyes:     Conjunctiva/sclera: Conjunctivae normal.  Cardiovascular:     Rate and Rhythm: Regular rhythm.  Heart sounds: Normal heart sounds.  Pulmonary:     Effort: Pulmonary effort is normal.     Breath sounds: Normal breath sounds.     Comments: Lungs clear to auscultation Musculoskeletal:     Cervical back: Neck supple.  Skin:    General: Skin is warm.     Comments: Eczematous dry lesion noted right lower leg  Neurological:     Mental Status: He is alert.  Psychiatric:        Mood and Affect: Mood normal.        Behavior: Behavior normal.     Diagnostics: FVC 1.87 L, FEV1 1.50 L.  Predicted FVC 3.69 L, FEV1 2.79 L.  Spirometry  indicates moderate restriction.  Status post bronchodilator response shows FVC 2.22 L, FEV1 1.75 L.  Spirometry indicates moderate restriction with a 17% change in FEV1  Assessment and Plan: 1. Moderate persistent asthma without complication   2. Seasonal and perennial allergic rhinitis   3. Intrinsic atopic dermatitis     No orders of the defined types were placed in this encounter.   Patient Instructions  1. Moderate persistent asthma,uncomplicated  Stop Symbciort and Spiriva 18 mcg - Daily controller medication(s): Start Breztri two puffs twice daily with spacer. Use this every day - Prior to physical activity: ProAir 2 puffs 10-15 minutes before physical activity. - Rescue medications: ProAir 4 puffs every 4-6 hours as needed - Asthma control goals:  * Full participation in all desired activities (may need albuterol before activity) * Albuterol use two time or less a week on average (not counting use with activity) * Cough interfering with sleep two time or less a month * Oral steroids no more than once a year * No hospitalizations  2. Seasonal and perennial allergic rhinitis (ragweed, mixed feathers, mouse, trees, weeds, grasses, indoor molds, outdoor molds, dust mites, cat, dog and cockroach)  - Continue with: Zyrtec (cetirizine) 10mg  tablet once daily, Flonase (fluticasone) two sprays per nostril daily as needed and Astelin (azelastine) 2 sprays per nostril 1-2 times daily as needed, Singulair (montelukast) 10 mg at night - Consider nasal saline rinses 1-2 times daily to remove allergens from the nasal cavities as well as help with mucous clearance (this is especially helpful to do before the nasal sprays are given)   3. Intrinsic atopic dermatitis - Continue with triamcinolone 0.1% ointment twice daily as needed to the worst areas.  - Continue with moisturizing twice daily.  Schedule a follow up appointment in  2 months           Return in about 2 months (around  09/24/2020), or if symptoms worsen or fail to improve.    Thank you for the opportunity to care for this patient.  Please do not hesitate to contact me with questions.  Althea Charon, FNP [facility]

## 2020-08-21 ENCOUNTER — Ambulatory Visit: Payer: Medicare Other | Admitting: Podiatry

## 2020-09-26 ENCOUNTER — Ambulatory Visit: Payer: Medicare Other | Admitting: Allergy & Immunology

## 2020-09-28 ENCOUNTER — Ambulatory Visit: Payer: Medicare Other | Admitting: Podiatry

## 2020-10-10 ENCOUNTER — Ambulatory Visit: Payer: Medicare Other | Admitting: Family

## 2020-10-10 ENCOUNTER — Ambulatory Visit: Payer: Medicare Other | Admitting: Family Medicine

## 2020-10-12 ENCOUNTER — Ambulatory Visit (INDEPENDENT_AMBULATORY_CARE_PROVIDER_SITE_OTHER): Payer: Medicare Other | Admitting: Family Medicine

## 2020-10-12 ENCOUNTER — Telehealth: Payer: Self-pay | Admitting: Family Medicine

## 2020-10-12 ENCOUNTER — Other Ambulatory Visit: Payer: Self-pay

## 2020-10-12 ENCOUNTER — Encounter: Payer: Self-pay | Admitting: Family Medicine

## 2020-10-12 VITALS — BP 160/100 | HR 76 | Temp 98.0°F | Resp 18 | Ht 68.0 in | Wt 246.6 lb

## 2020-10-12 DIAGNOSIS — L2084 Intrinsic (allergic) eczema: Secondary | ICD-10-CM

## 2020-10-12 DIAGNOSIS — K219 Gastro-esophageal reflux disease without esophagitis: Secondary | ICD-10-CM

## 2020-10-12 DIAGNOSIS — H1010 Acute atopic conjunctivitis, unspecified eye: Secondary | ICD-10-CM

## 2020-10-12 DIAGNOSIS — J4540 Moderate persistent asthma, uncomplicated: Secondary | ICD-10-CM | POA: Diagnosis not present

## 2020-10-12 DIAGNOSIS — J302 Other seasonal allergic rhinitis: Secondary | ICD-10-CM

## 2020-10-12 DIAGNOSIS — J3089 Other allergic rhinitis: Secondary | ICD-10-CM

## 2020-10-12 DIAGNOSIS — H1013 Acute atopic conjunctivitis, bilateral: Secondary | ICD-10-CM | POA: Diagnosis not present

## 2020-10-12 MED ORDER — BUDESONIDE-FORMOTEROL FUMARATE 160-4.5 MCG/ACT IN AERO: 2.0000 | INHALATION_SPRAY | Freq: Two times a day (BID) | RESPIRATORY_TRACT | 5 refills | Status: DC

## 2020-10-12 MED ORDER — ALBUTEROL SULFATE HFA 108 (90 BASE) MCG/ACT IN AERS
INHALATION_SPRAY | RESPIRATORY_TRACT | 1 refills | Status: DC
Start: 2020-10-12 — End: 2020-12-13

## 2020-10-12 MED ORDER — SPIRIVA HANDIHALER 18 MCG IN CAPS: 18.0000 ug | ORAL_CAPSULE | Freq: Every day | RESPIRATORY_TRACT | 5 refills | Status: DC

## 2020-10-12 NOTE — Telephone Encounter (Signed)
New prescription has been sent to the pharmacy with this correction.

## 2020-10-12 NOTE — Telephone Encounter (Signed)
Pharmacy called requesting for patient's Symbicort to be sent in as grams instead of one inhaler.Pharmacist states it needs to be sent in grams so insurance will cover it for patient. Pharmacist states one inhaler is equal to 10.2 grams.   [facility]  [address] [place], [place] [address] Phone: [phone] Fax: [phone]

## 2020-10-12 NOTE — Patient Instructions (Addendum)
Asthma Stop Breztri as you did not like this medication Continue Symbicort 160-2 puffs twice a day with a spacer to prevent cough or wheeze as you have been Restart Spiriva Handihaler-one inhalation once a day Continue albuterol 2 puffs every 4 hours as needed for cough or wheeze OR Instead use albuterol 0.083% solution via nebulizer one unit vial every 4 hours as needed for cough or wheeze You may use albuterol 2 puffs 5-15 minutes before activity to decrease cough or wheeze  Allergic rhinitis Continue allergen avoidance measures directed toward grass pollen, weed pollen, tree pollen, ragweed pollen, mold, cockroach, dust mite, and pets as listed below.   Continue cetirizine 10 mg once a day as needed for runny nose or itch Continue Flonase 2 sprays in each nostril once a day as needed for a stuffy nose.  In the right nostril, point the applicator out toward the right ear. In the left nostril, point the applicator out toward the left ear Consider saline nasal rinses as needed for nasal symptoms. Use this before any medicated nasal sprays for best result  Allergic conjunctivitis Continue Pataday one drop in each eye once a day as needed for red or itchy eyes  Atopic dermatitis Start a twice a day moisturizing routine  Continue triamcinolone 0.1% cream to red itchy areas below your face twice a day as needed  Reflux Continue dietary and lifestyle modifications as listed below Continue omeprazole 20 mg once a day to prevent reflux  Your blood pressure was elevated at today's visit. Follow up with your primary care provider for evaluation of your blood pressure  Call the clinic if this treatment plan is not working well for you  Follow up in 4 months or sooner if needed.   Lifestyle Changes for Controlling GERD When you have GERD, stomach acid feels as if it's backing up toward your mouth. Whether or not you take medication to control your GERD, your symptoms can often be improved with  lifestyle changes.   Raise Your Head  Reflux is more likely to strike when you're lying down flat, because stomach fluid can  flow backward more easily. Raising the head of your bed 4-6 inches can help. To do this:  Slide blocks or books under the legs at the head of your bed. Or, place a wedge under  the mattress. Many foam stores can make a suitable wedge for you. The wedge  should run from your waist to the top of your head.  Don't just prop your head on several pillows. This increases pressure on your  stomach. It can make GERD worse.  Watch Your Eating Habits Certain foods may increase the acid in your stomach or relax the lower esophageal sphincter, making GERD more likely. It's best to avoid the following:  Coffee, tea, and carbonated drinks (with and without caffeine)  Fatty, fried, or spicy food  Mint, chocolate, onions, and tomatoes  Any other foods that seem to irritate your stomach or cause you pain  Relieve the Pressure  Eat smaller meals, even if you have to eat more often.  Don't lie down right after you eat. Wait a few hours for your stomach to empty.  Avoid tight belts and tight-fitting clothes.  Lose excess weight.  Tobacco and Alcohol  Avoid smoking tobacco and drinking alcohol. They can make GERD symptoms worse.  Reducing Pollen Exposure The American Academy of Allergy, Asthma and Immunology suggests the following steps to reduce your exposure to pollen during allergy seasons. 1.  Do not hang sheets or clothing out to dry; pollen may collect on these items. 2. Do not mow lawns or spend time around freshly cut grass; mowing stirs up pollen. 3. Keep windows closed at night.  Keep car windows closed while driving. 4. Minimize morning activities outdoors, a time when pollen counts are usually at their highest. 5. Stay indoors as much as possible when pollen counts or humidity is high and on windy days when pollen tends to remain in the air  longer. 6. Use air conditioning when possible.  Many air conditioners have filters that trap the pollen spores. 7. Use a HEPA room air filter to remove pollen form the indoor air you breathe.  Control of Mold Allergen Mold and fungi can grow on a variety of surfaces provided certain temperature and moisture conditions exist.  Outdoor molds grow on plants, decaying vegetation and soil.  The major outdoor mold, Alternaria and Cladosporium, are found in very high numbers during hot and dry conditions.  Generally, a late Summer - Fall peak is seen for common outdoor fungal spores.  Rain will temporarily lower outdoor mold spore count, but counts rise rapidly when the rainy period ends.  The most important indoor molds are Aspergillus and Penicillium.  Dark, humid and poorly ventilated basements are ideal sites for mold growth.  The next most common sites of mold growth are the bathroom and the kitchen.  Outdoor [employer] 8. Use air conditioning and keep windows closed 9. Avoid exposure to decaying vegetation. 10. Avoid leaf raking. 11. Avoid grain handling. 12. Consider wearing a face mask if working in moldy areas.  Indoor Mold Control 1. Maintain humidity below 50%. 2. Clean washable surfaces with 5% bleach solution. 3. Remove sources e.g. Contaminated carpets.   Control of Dust Mite Allergen Dust mites play a major role in allergic asthma and rhinitis. They occur in environments with high humidity wherever human skin is found. Dust mites absorb humidity from the atmosphere (ie, they do not drink) and feed on organic matter (including shed human and animal skin). Dust mites are a microscopic type of insect that you cannot see with the naked eye. High levels of dust mites have been detected from mattresses, pillows, carpets, upholstered furniture, bed covers, clothes, soft toys and any woven material. The principal allergen of the dust mite is found in its feces. A gram of dust may contain 1,000  mites and 250,000 fecal particles. Mite antigen is easily measured in the air during house cleaning activities. Dust mites do not bite and do not cause harm to humans, other than by triggering allergies/asthma.  Ways to decrease your exposure to dust mites in your home:  1. Encase mattresses, box springs and pillows with a mite-impermeable barrier or cover  2. Wash sheets, blankets and drapes weekly in hot water (130 F) with detergent and dry them in a dryer on the hot setting.  3. Have the room cleaned frequently with a vacuum cleaner and a damp dust-mop. For carpeting or rugs, vacuuming with a vacuum cleaner equipped with a high-efficiency particulate air (HEPA) filter. The dust mite allergic individual should not be in a room which is being cleaned and should wait 1 hour after cleaning before going into the room.  4. Do not sleep on upholstered furniture (eg, couches).  5. If possible removing carpeting, upholstered furniture and drapery from the home is ideal. Horizontal blinds should be eliminated in the rooms where the person spends the most time (bedroom, study,  television room). Washable vinyl, roller-type shades are optimal.  6. Remove all non-washable stuffed toys from the bedroom. Wash stuffed toys weekly like sheets and blankets above.  7. Reduce indoor humidity to less than 50%. Inexpensive humidity monitors can be purchased at most hardware stores. Do not use a humidifier as can make the problem worse and are not recommended.  Control of Cockroach Allergen  Cockroach allergen has been identified as an important cause of acute attacks of asthma, especially in urban settings.  There are fifty-five species of cockroach that exist in the [place], however only three, the [place], [place] species produce allergen that can affect patients with Asthma.  Allergens can be obtained from fecal particles, egg casings and secretions from cockroaches.    1. Remove food  sources. 2. Reduce access to water. 3. Seal access and entry points. 4. Spray runways with 0.5-1% Diazinon or Chlorpyrifos 5. Blow boric acid power under stoves and refrigerator. 6. Place bait stations (hydramethylnon) at feeding sites.   Control of Dog or Cat Allergen Avoidance is the best way to manage a dog or cat allergy. If you have a dog or cat and are allergic to dog or cats, consider removing the dog or cat from the home. If you have a dog or cat but don't want to find it a new home, or if your family wants a pet even though someone in the household is allergic, here are some strategies that may help keep symptoms at bay:  13. Keep the pet out of your bedroom and restrict it to only a few rooms. Be advised that keeping the dog or cat in only one room will not limit the allergens to that room. 30. Don't pet, hug or kiss the dog or cat; if you do, wash your hands with soap and water. 15. High-efficiency particulate air (HEPA) cleaners run continuously in a bedroom or living room can reduce allergen levels over time. 16. Regular use of a high-efficiency vacuum cleaner or a central vacuum can reduce allergen levels. 17. Giving your dog or cat a bath at least once a week can reduce airborne allergen.

## 2020-10-12 NOTE — Progress Notes (Signed)
[address], [address] [place] [address] Dept: [phone]  FOLLOW UP NOTE  Patient ID: Nicholas Burns Human, male    DOB: Aug 19, 1950  Age: 70 y.o. MRN: [id number] Date of Office Visit: 10/12/2020  Assessment  Chief Complaint: Follow-up  HPI Nicholas Burns is a 70 year old male who presents to the clinic for follow-up visit.  He was last seen in this clinic on 07/25/2020 by Althea Charon, FNP, for evaluation of asthma, allergic rhinitis, allergic conjunctivitis, atopic dermatitis, and reflux.  At today's visit, he reports his asthma has been moderately well controlled with occasional shortness of breath occurring mostly while walking up hills and occasional cough producing " white stuff". He reports he uses his albuterol after getting to the top of the hill with relief of symptoms. He rarely uses his albuterol before activity.  He denies wheeze with activity and rest.  He reports that he is out of [place] and has started using Symbicort 160-2 puffs twice a day about 1 month ago. He reports that he thinks Symbicort provides more relief of symptoms than Breztri. He continues montelukast 10 mg once a day and uses albuterol about 1-2 times a week with relief of symptoms. He is not currently using Spiriva. Allergic rhinitis is reported as moderately well controlled with occasional sneezing for which he continues cetirizine 10 mg once a day and is not currently using Flonase or nasal saline rinse.  Allergic conjunctivitis is reported as moderately well controlled with symptoms including redness, itching, and burning for which he uses Pataday as needed with relief of symptoms.  Atopic dermatitis is reported as moderately well controlled with 1 red, itchy area on his right calf for which he uses triamcinolone cream as needed with relief of symptoms.  He is not currently using a daily moisturizer routine.  Reflux is reported as well controlled with no heartburn or vomiting with omeprazole 20 mg once a day.   His current medications are listed in the chart.  Drug Allergies:  No Known Allergies  Physical Exam: BP (!) 160/100 (BP Location: Right Arm, Patient Position: Sitting, Cuff Size: Normal)   Pulse 76   Temp 98 F (36.7 C) (Temporal)   Resp 18   Ht 5\' 8"  (1.727 m)   Wt 246 lb 9.6 oz (111.9 kg)   SpO2 95%   BMI 37.50 kg/m    Physical Exam Vitals reviewed.  Constitutional:      Appearance: Normal appearance.  HENT:     Head: Normocephalic and atraumatic.     Right Ear: Tympanic membrane normal.     Left Ear: Tympanic membrane normal.     Nose:     Comments: Bilateral nares slightly erythematous. Pharynx normal. Ears normal. Eyes normal.    Mouth/Throat:     Pharynx: Oropharynx is clear.  Eyes:     Conjunctiva/sclera: Conjunctivae normal.  Cardiovascular:     Rate and Rhythm: Normal rate and regular rhythm.     Heart sounds: Normal heart sounds. No murmur heard.   Pulmonary:     Effort: Pulmonary effort is normal.     Breath sounds: Normal breath sounds.     Comments: Lungs clear to auscultation Musculoskeletal:        General: Normal range of motion.     Cervical back: Normal range of motion and neck supple.  Skin:    General: Skin is warm.     Comments: Hyperpigmented eczematous area noted on his right calf.  No open areas or drainage noted.  Neurological:     Mental Status: He is alert and oriented to person, place, and time.  Psychiatric:        Mood and Affect: Mood normal.        Behavior: Behavior normal.        Thought Content: Thought content normal.        Judgment: Judgment normal.     Diagnostics: FVC 2.20, FEV1 1.77.  Predicted FVC 3.36, predicted FEV1 2.58.  Spirometry indicates moderate restriction.  This is consistent with previous spirometry readings.  Assessment and Plan: 1. Not well controlled moderate persistent asthma   2. Seasonal and perennial allergic rhinitis   3. Intrinsic atopic dermatitis   4. Seasonal allergic conjunctivitis   5.  Gastroesophageal reflux disease, unspecified whether esophagitis present   6. Moderate persistent asthma without complication     Meds ordered this encounter  Medications  . budesonide-formoterol (SYMBICORT) 160-4.5 MCG/ACT inhaler    Sig: Inhale 2 puffs into the lungs 2 (two) times daily.    Dispense:  1 each    Refill:  5  . tiotropium (SPIRIVA HANDIHALER) 18 MCG inhalation capsule    Sig: Place 1 capsule (18 mcg total) into inhaler and inhale daily.    Dispense:  31 capsule    Refill:  5  . albuterol (VENTOLIN HFA) 108 (90 Base) MCG/ACT inhaler    Sig: USE 2 PUFFS EVERY 6 HOURS AS NEEDED    Dispense:  18 g    Refill:  1    Patient Instructions  Asthma Stop Breztri as you did not like this medication Continue Symbicort 160-2 puffs twice a day with a spacer to prevent cough or wheeze as you have been Restart Spiriva Handihaler-one inhalation once a day Continue albuterol 2 puffs every 4 hours as needed for cough or wheeze OR Instead use albuterol 0.083% solution via nebulizer one unit vial every 4 hours as needed for cough or wheeze You may use albuterol 2 puffs 5-15 minutes before activity to decrease cough or wheeze  Allergic rhinitis Continue allergen avoidance measures directed toward grass pollen, weed pollen, tree pollen, ragweed pollen, mold, cockroach, dust mite, and pets as listed below.   Continue cetirizine 10 mg once a day as needed for runny nose or itch Continue Flonase 2 sprays in each nostril once a day as needed for a stuffy nose.  In the right nostril, point the applicator out toward the right ear. In the left nostril, point the applicator out toward the left ear Consider saline nasal rinses as needed for nasal symptoms. Use this before any medicated nasal sprays for best result  Allergic conjunctivitis Continue Pataday one drop in each eye once a day as needed for red or itchy eyes  Atopic dermatitis Start a twice a day moisturizing routine  Continue  triamcinolone 0.1% cream to red itchy areas below your face twice a day as needed  Reflux Continue dietary and lifestyle modifications as listed below Continue omeprazole 20 mg once a day to prevent reflux  Your blood pressure was elevated at today's visit. Follow up with your primary care provider for evaluation of your blood pressure  Call the clinic if this treatment plan is not working well for you  Follow up in 4 months or sooner if needed.   Return in about 4 months (around 02/12/2021), or if symptoms worsen or fail to improve.    Thank you for the opportunity to care for this patient.  Please do not hesitate to  contact me with questions.  Gareth Morgan, FNP [facility]

## 2020-10-19 ENCOUNTER — Ambulatory Visit: Payer: Medicare Other | Admitting: Podiatry

## 2020-11-06 ENCOUNTER — Ambulatory Visit: Payer: Medicare Other | Admitting: Podiatry

## 2020-11-09 ENCOUNTER — Other Ambulatory Visit: Payer: Self-pay

## 2020-11-09 ENCOUNTER — Encounter: Payer: Self-pay | Admitting: Podiatry

## 2020-11-09 ENCOUNTER — Ambulatory Visit: Payer: Medicare Other | Admitting: Podiatry

## 2020-11-09 DIAGNOSIS — M79675 Pain in left toe(s): Secondary | ICD-10-CM | POA: Diagnosis not present

## 2020-11-09 DIAGNOSIS — B351 Tinea unguium: Secondary | ICD-10-CM

## 2020-11-09 DIAGNOSIS — E1140 Type 2 diabetes mellitus with diabetic neuropathy, unspecified: Secondary | ICD-10-CM

## 2020-11-09 DIAGNOSIS — M2042 Other hammer toe(s) (acquired), left foot: Secondary | ICD-10-CM

## 2020-11-09 DIAGNOSIS — M2041 Other hammer toe(s) (acquired), right foot: Secondary | ICD-10-CM | POA: Diagnosis not present

## 2020-11-09 DIAGNOSIS — E1149 Type 2 diabetes mellitus with other diabetic neurological complication: Secondary | ICD-10-CM

## 2020-11-09 DIAGNOSIS — M79674 Pain in right toe(s): Secondary | ICD-10-CM

## 2020-11-09 NOTE — Progress Notes (Signed)
This patient presents to the office with chief complaint of long thick painful nails.  Patient says the nails are painful walking and wearing shoes.  This patient is unable to self treat.  This patient is unable to trim his nails since she is unable to reach his nails.  He presents to the office for preventative foot care services.  General Appearance  Alert, conversant and in no acute stress.  Vascular  Dorsalis pedis and posterior tibial  pulses are palpable  bilaterally.  Capillary return is within normal limits  bilaterally. Temperature is within normal limits  bilaterally.  Neurologic  Senn-Weinstein monofilament wire test within normal limits  bilaterally. Muscle power within normal limits bilaterally.  Nails Thick disfigured discolored nails with subungual debris  from hallux to fifth toes bilaterally. No evidence of bacterial infection or drainage bilaterally.  Orthopedic  No limitations of motion  feet .  No crepitus or effusions noted.  Hammer toes  B/L.  Skin  normotropic skin with no porokeratosis noted bilaterally.  No signs of infections or ulcers noted.     Onychomycosis  Nails  B/L.  Pain in right toes  Pain in left toes  Debridement of nails both feet followed trimming the nails with dremel tool.    RTC 3 months.   Gardiner Barefoot DPM

## 2020-12-13 ENCOUNTER — Other Ambulatory Visit: Payer: Self-pay | Admitting: Family Medicine

## 2020-12-13 DIAGNOSIS — J4540 Moderate persistent asthma, uncomplicated: Secondary | ICD-10-CM

## 2021-02-15 ENCOUNTER — Ambulatory Visit: Payer: Medicare Other | Admitting: Allergy & Immunology

## 2021-02-18 ENCOUNTER — Ambulatory Visit: Payer: Medicare Other | Admitting: Family Medicine

## 2021-02-18 ENCOUNTER — Encounter: Payer: Self-pay | Admitting: Family Medicine

## 2021-02-18 ENCOUNTER — Other Ambulatory Visit: Payer: Self-pay

## 2021-02-18 VITALS — BP 118/70 | HR 60 | Temp 98.0°F | Resp 16

## 2021-02-18 DIAGNOSIS — K219 Gastro-esophageal reflux disease without esophagitis: Secondary | ICD-10-CM | POA: Insufficient documentation

## 2021-02-18 DIAGNOSIS — J3089 Other allergic rhinitis: Secondary | ICD-10-CM | POA: Diagnosis not present

## 2021-02-18 DIAGNOSIS — J4540 Moderate persistent asthma, uncomplicated: Secondary | ICD-10-CM | POA: Diagnosis not present

## 2021-02-18 DIAGNOSIS — H1013 Acute atopic conjunctivitis, bilateral: Secondary | ICD-10-CM

## 2021-02-18 DIAGNOSIS — H1010 Acute atopic conjunctivitis, unspecified eye: Secondary | ICD-10-CM

## 2021-02-18 DIAGNOSIS — L2084 Intrinsic (allergic) eczema: Secondary | ICD-10-CM | POA: Diagnosis not present

## 2021-02-18 DIAGNOSIS — J302 Other seasonal allergic rhinitis: Secondary | ICD-10-CM

## 2021-02-18 MED ORDER — CETIRIZINE HCL 10 MG PO TABS: 10.0000 mg | ORAL_TABLET | Freq: Every day | ORAL | 5 refills | Status: DC

## 2021-02-18 NOTE — Patient Instructions (Addendum)
Asthma Increase Symbicort 160 to 2 puffs twice a day with a spacer to prevent cough or wheeze  Continue Spiriva Handihaler-one inhalation once a day Continue albuterol 2 puffs every 4 hours as needed for cough or wheeze OR Instead use albuterol 0.083% solution via nebulizer one unit vial every 4 hours as needed for cough or wheeze You may use albuterol 2 puffs 5-15 minutes before activity to decrease cough or wheeze  Allergic rhinitis Continue allergen avoidance measures directed toward grass pollen, weed pollen, tree pollen, ragweed pollen, mold, cockroach, dust mite, and pets as listed below.   Continue cetirizine 10 mg once a day as needed for runny nose or itch Continue Flonase 2 sprays in each nostril once a day as needed for a stuffy nose.  In the right nostril, point the applicator out toward the right ear. In the left nostril, point the applicator out toward the left ear Consider saline nasal rinses as needed for nasal symptoms. Use this before any medicated nasal sprays for best result  Allergic conjunctivitis Continue Pataday one drop in each eye once a day as needed for red or itchy eyes  Atopic dermatitis Continue a twice a day moisturizing routine  Continue triamcinolone 0.1% cream to red itchy areas below your face twice a day as needed  Reflux Continue dietary and lifestyle modifications as listed below Continue omeprazole 20 mg once a day to prevent reflux  Follow-up with your primary care provider for evaluation and treatment of hand tremors  Call the clinic if this treatment plan is not working well for you  Follow up in 4 months or sooner if needed.   Lifestyle Changes for Controlling GERD When you have GERD, stomach acid feels as if it's backing up toward your mouth. Whether or not you take medication to control your GERD, your symptoms can often be improved with lifestyle changes.   Raise Your Head Reflux is more likely to strike when you're lying down flat,  because stomach fluid can flow backward more easily. Raising the head of your bed 4-6 inches can help. To do this: Slide blocks or books under the legs at the head of your bed. Or, place a wedge under the mattress. Many foam stores can make a suitable wedge for you. The wedge should run from your waist to the top of your head. Don't just prop your head on several pillows. This increases pressure on your stomach. It can make GERD worse.  Watch Your Eating Habits Certain foods may increase the acid in your stomach or relax the lower esophageal sphincter, making GERD more likely. It's best to avoid the following: Coffee, tea, and carbonated drinks (with and without caffeine) Fatty, fried, or spicy food Mint, chocolate, onions, and tomatoes Any other foods that seem to irritate your stomach or cause you pain  Relieve the Pressure Eat smaller meals, even if you have to eat more often. Don't lie down right after you eat. Wait a few hours for your stomach to empty. Avoid tight belts and tight-fitting clothes. Lose excess weight.  Tobacco and Alcohol Avoid smoking tobacco and drinking alcohol. They can make GERD symptoms worse.  Reducing Pollen Exposure The American Academy of Allergy, Asthma and Immunology suggests the following steps to reduce your exposure to pollen during allergy seasons. Do not hang sheets or clothing out to dry; pollen may collect on these items. Do not mow lawns or spend time around freshly cut grass; mowing stirs up pollen. Keep windows closed at night.  Keep car windows closed while driving. Minimize morning activities outdoors, a time when pollen counts are usually at their highest. Stay indoors as much as possible when pollen counts or humidity is high and on windy days when pollen tends to remain in the air longer. Use air conditioning when possible.  Many air conditioners have filters that trap the pollen spores. Use a HEPA room air filter to remove pollen form  the indoor air you breathe.  Control of Mold Allergen Mold and fungi can grow on a variety of surfaces provided certain temperature and moisture conditions exist.  Outdoor molds grow on plants, decaying vegetation and soil.  The major outdoor mold, Alternaria and Cladosporium, are found in very high numbers during hot and dry conditions.  Generally, a late Summer - Fall peak is seen for common outdoor fungal spores.  Rain will temporarily lower outdoor mold spore count, but counts rise rapidly when the rainy period ends.  The most important indoor molds are Aspergillus and Penicillium.  Dark, humid and poorly ventilated basements are ideal sites for mold growth.  The next most common sites of mold growth are the bathroom and the kitchen.  Outdoor [employer] Use air conditioning and keep windows closed Avoid exposure to decaying vegetation. Avoid leaf raking. Avoid grain handling. Consider wearing a face mask if working in moldy areas.  Indoor Mold Control Maintain humidity below 50%. Clean washable surfaces with 5% bleach solution. Remove sources e.g. Contaminated carpets.   Control of Dust Mite Allergen Dust mites play a major role in allergic asthma and rhinitis. They occur in environments with high humidity wherever human skin is found. Dust mites absorb humidity from the atmosphere (ie, they do not drink) and feed on organic matter (including shed human and animal skin). Dust mites are a microscopic type of insect that you cannot see with the naked eye. High levels of dust mites have been detected from mattresses, pillows, carpets, upholstered furniture, bed covers, clothes, soft toys and any woven material. The principal allergen of the dust mite is found in its feces. A gram of dust may contain 1,000 mites and 250,000 fecal particles. Mite antigen is easily measured in the air during house cleaning activities. Dust mites do not bite and do not cause harm to humans, other than by triggering  allergies/asthma.  Ways to decrease your exposure to dust mites in your home:  1. Encase mattresses, box springs and pillows with a mite-impermeable barrier or cover  2. Wash sheets, blankets and drapes weekly in hot water (130 F) with detergent and dry them in a dryer on the hot setting.  3. Have the room cleaned frequently with a vacuum cleaner and a damp dust-mop. For carpeting or rugs, vacuuming with a vacuum cleaner equipped with a high-efficiency particulate air (HEPA) filter. The dust mite allergic individual should not be in a room which is being cleaned and should wait 1 hour after cleaning before going into the room.  4. Do not sleep on upholstered furniture (eg, couches).  5. If possible removing carpeting, upholstered furniture and drapery from the home is ideal. Horizontal blinds should be eliminated in the rooms where the person spends the most time (bedroom, study, television room). Washable vinyl, roller-type shades are optimal.  6. Remove all non-washable stuffed toys from the bedroom. Wash stuffed toys weekly like sheets and blankets above.  7. Reduce indoor humidity to less than 50%. Inexpensive humidity monitors can be purchased at most hardware stores. Do not use a  humidifier as can make the problem worse and are not recommended.  Control of Cockroach Allergen  Cockroach allergen has been identified as an important cause of acute attacks of asthma, especially in urban settings.  There are fifty-five species of cockroach that exist in the [place], however only three, the [place], [place] species produce allergen that can affect patients with Asthma.  Allergens can be obtained from fecal particles, egg casings and secretions from cockroaches.    Remove food sources. Reduce access to water. Seal access and entry points. Spray runways with 0.5-1% Diazinon or Chlorpyrifos Blow boric acid power under stoves and refrigerator. Place bait stations  (hydramethylnon) at feeding sites.   Control of Dog or Cat Allergen Avoidance is the best way to manage a dog or cat allergy. If you have a dog or cat and are allergic to dog or cats, consider removing the dog or cat from the home. If you have a dog or cat but don't want to find it a new home, or if your family wants a pet even though someone in the household is allergic, here are some strategies that may help keep symptoms at bay:  Keep the pet out of your bedroom and restrict it to only a few rooms. Be advised that keeping the dog or cat in only one room will not limit the allergens to that room. Don't pet, hug or kiss the dog or cat; if you do, wash your hands with soap and water. High-efficiency particulate air (HEPA) cleaners run continuously in a bedroom or living room can reduce allergen levels over time. Regular use of a high-efficiency vacuum cleaner or a central vacuum can reduce allergen levels. Giving your dog or cat a bath at least once a week can reduce airborne allergen.

## 2021-02-18 NOTE — Progress Notes (Addendum)
[address], [address] [place] [address] Dept: [phone]  FOLLOW UP NOTE  Patient ID: Nicholas Burns, male    DOB: December 24, 1950  Age: 70 y.o. MRN: [id number] Date of Office Visit: 02/18/2021  Assessment  Chief Complaint: Follow-up  HPI Nicholas Burns is a 70 year old male who presents the clinic for follow-up visit.  He was last seen in this clinic on 10/12/2020 for evaluation of asthma, allergic rhinitis, allergic conjunctivitis, atopic dermatitis, and reflux.  At today's visit, he reports his asthma has been moderately well controlled with occasional wheeze occurring mostly with activity and in the morning in addition to cough occurring in the morning and producing white phlegm.  He continues Symbicort 160-2 puffs in the morning only and Spiriva HandiHaler in the morning.  He uses albuterol 2 puffs before he walks and uses 2 puffs when he gets to an incline in his walk.  He reports he does not use Symbicort 2 puffs at nighttime because he is breathing fine.  Allergic rhinitis is reported as moderately well controlled with the main symptom of postnasal drainage occurring in the morning only.  He continues cetirizine 10 mg once a day and is not using any nasal sprays or nasal saline rinses.  Allergic conjunctivitis is reported as moderately well controlled with occasional watery discharge for which she does not use an allergy eyedrop.  Atopic dermatitis is reported as moderately well controlled in a flare in remission pattern occurring mainly in the flexural area of the popliteal and antecubital fossa for which he uses a moisturizing routine as well as triamcinolone with relief of symptoms.  Reflux is reported as well controlled with omeprazole 20 mg once a day.  He does report that he has had shaking in his left hand lasting about 10 minutes.  He reports this is not occurring around albuterol use.  He does report that he checked his blood sugar when this happened twice with reported sugars  of 68 and 72.  He reports that he ate a chocolate bar and felt better.  His current medications are listed in the chart.   Drug Allergies:  No Known Allergies  Physical Exam: BP 118/70 (BP Location: Left Arm, Patient Position: Sitting, Cuff Size: Normal)   Pulse 60   Temp 98 F (36.7 C) (Temporal)   Resp 16   SpO2 97%    Physical Exam Vitals reviewed.  Constitutional:      Appearance: Normal appearance.  HENT:     Head: Normocephalic and atraumatic.     Right Ear: Tympanic membrane normal.     Left Ear: Tympanic membrane normal.     Nose:     Comments: Bilateral nares slightly erythematous with clear nasal drainage noted.  Pharynx normal.  Ears normal.  Eyes normal.    Mouth/Throat:     Pharynx: Oropharynx is clear.  Eyes:     Conjunctiva/sclera: Conjunctivae normal.  Cardiovascular:     Rate and Rhythm: Normal rate and regular rhythm.     Heart sounds: Normal heart sounds. No murmur heard. Pulmonary:     Effort: Pulmonary effort is normal.     Breath sounds: Normal breath sounds.     Comments: Lungs clear to auscultation Musculoskeletal:        General: Normal range of motion.     Cervical back: Normal range of motion and neck supple.  Skin:    General: Skin is warm and dry.  Neurological:     Mental Status: He is alert and  oriented to person, place, and time.  Psychiatric:        Mood and Affect: Mood normal.        Behavior: Behavior normal.        Thought Content: Thought content normal.        Judgment: Judgment normal.    Diagnostics: FVC 2.10, FEV1 1.58.  Predicted FVC 3.35, predicted FEV1 2.58.  Spirometry indicates moderate restriction.  Postbronchodilator FVC 2.55, FEV1 1.79.  Postbronchodilator spirometry indicates normal ventilatory function.  Assessment and Plan: 1. Not well controlled moderate persistent asthma   2. Seasonal and perennial allergic rhinitis   3. Seasonal allergic conjunctivitis   4. Intrinsic atopic dermatitis   5.  Gastroesophageal reflux disease, unspecified whether esophagitis present     Meds ordered this encounter  Medications   cetirizine (ZYRTEC) 10 MG tablet    Sig: Take 1 tablet (10 mg total) by mouth daily.    Dispense:  30 tablet    Refill:  5     Patient Instructions  Asthma Increase Symbicort 160 to 2 puffs twice a day with a spacer to prevent cough or wheeze  Continue Spiriva Handihaler-one inhalation once a day Continue albuterol 2 puffs every 4 hours as needed for cough or wheeze OR Instead use albuterol 0.083% solution via nebulizer one unit vial every 4 hours as needed for cough or wheeze You may use albuterol 2 puffs 5-15 minutes before activity to decrease cough or wheeze  Allergic rhinitis Continue allergen avoidance measures directed toward grass pollen, weed pollen, tree pollen, ragweed pollen, mold, cockroach, dust mite, and pets as listed below.   Continue cetirizine 10 mg once a day as needed for runny nose or itch Continue Flonase 2 sprays in each nostril once a day as needed for a stuffy nose.  In the right nostril, point the applicator out toward the right ear. In the left nostril, point the applicator out toward the left ear Consider saline nasal rinses as needed for nasal symptoms. Use this before any medicated nasal sprays for best result  Allergic conjunctivitis Continue Pataday one drop in each eye once a day as needed for red or itchy eyes  Atopic dermatitis Continue a twice a day moisturizing routine  Continue triamcinolone 0.1% cream to red itchy areas below your face twice a day as needed  Reflux Continue dietary and lifestyle modifications as listed below Continue omeprazole 20 mg once a day to prevent reflux  Follow-up with your primary care provider for evaluation and treatment of hand tremors  Call the clinic if this treatment plan is not working well for you  Follow up in 4 months or sooner if needed.   Return in about 4 months (around  06/21/2021), or if symptoms worsen or fail to improve.    Thank you for the opportunity to care for this patient.  Please do not hesitate to contact me with questions.  Gareth Morgan, FNP [facility]

## 2021-03-15 ENCOUNTER — Encounter: Payer: Self-pay | Admitting: Podiatry

## 2021-03-15 ENCOUNTER — Other Ambulatory Visit: Payer: Self-pay

## 2021-03-15 ENCOUNTER — Ambulatory Visit: Payer: Medicare Other | Admitting: Podiatry

## 2021-03-15 DIAGNOSIS — M2141 Flat foot [pes planus] (acquired), right foot: Secondary | ICD-10-CM | POA: Diagnosis not present

## 2021-03-15 DIAGNOSIS — E1140 Type 2 diabetes mellitus with diabetic neuropathy, unspecified: Secondary | ICD-10-CM

## 2021-03-15 DIAGNOSIS — M2142 Flat foot [pes planus] (acquired), left foot: Secondary | ICD-10-CM | POA: Diagnosis not present

## 2021-03-15 DIAGNOSIS — E1149 Type 2 diabetes mellitus with other diabetic neurological complication: Secondary | ICD-10-CM | POA: Diagnosis not present

## 2021-03-15 DIAGNOSIS — M79674 Pain in right toe(s): Secondary | ICD-10-CM

## 2021-03-15 DIAGNOSIS — B351 Tinea unguium: Secondary | ICD-10-CM

## 2021-03-15 DIAGNOSIS — M2140 Flat foot [pes planus] (acquired), unspecified foot: Secondary | ICD-10-CM | POA: Insufficient documentation

## 2021-03-15 NOTE — Progress Notes (Signed)
This patient presents to the office with chief complaint of long thick painful nails.  Patient says the nails are painful walking and wearing shoes.  This patient is unable to self treat.  This patient is unable to trim his nails since she is unable to reach his nails. Patient says he is developing pain right ankle after walking.    He presents to the office for preventative foot care services.  General Appearance  Alert, conversant and in no acute stress.  Vascular  Dorsalis pedis and posterior tibial  pulses are palpable  bilaterally.  Capillary return is within normal limits  bilaterally. Temperature is within normal limits  bilaterally.  Neurologic  Senn-Weinstein monofilament wire test within normal limits  bilaterally. Muscle power within normal limits bilaterally.  Nails Thick disfigured discolored nails with subungual debris  from hallux to fifth toes bilaterally. No evidence of bacterial infection or drainage bilaterally.  Orthopedic  No limitations of motion  feet .  No crepitus or effusions noted.  Hammer toes  B/L.  Pain along the peroneal tendons right foot.  Pes planus.  Skin  normotropic skin with no porokeratosis noted bilaterally.  No signs of infections or ulcers noted.     Onychomycosis  Nails  B/L.  Pain in right toes  Pain in left toes  peroneal tendinitis right foot  Debridement of nails both feet  with nail nipper followed trimming the nails with dremel tool.   Padding dispensed to apply to arch of his walking shoes.  RTC 3 months.   Gardiner Barefoot DPM

## 2021-05-24 ENCOUNTER — Encounter: Payer: Self-pay | Admitting: Podiatry

## 2021-05-24 ENCOUNTER — Other Ambulatory Visit: Payer: Self-pay

## 2021-05-24 ENCOUNTER — Ambulatory Visit: Payer: Medicare Other | Admitting: Podiatry

## 2021-05-24 DIAGNOSIS — M2141 Flat foot [pes planus] (acquired), right foot: Secondary | ICD-10-CM

## 2021-05-24 DIAGNOSIS — E1149 Type 2 diabetes mellitus with other diabetic neurological complication: Secondary | ICD-10-CM

## 2021-05-24 DIAGNOSIS — B351 Tinea unguium: Secondary | ICD-10-CM

## 2021-05-24 DIAGNOSIS — M2041 Other hammer toe(s) (acquired), right foot: Secondary | ICD-10-CM

## 2021-05-24 DIAGNOSIS — M79674 Pain in right toe(s): Secondary | ICD-10-CM

## 2021-05-24 DIAGNOSIS — M2042 Other hammer toe(s) (acquired), left foot: Secondary | ICD-10-CM

## 2021-05-24 DIAGNOSIS — M79675 Pain in left toe(s): Secondary | ICD-10-CM

## 2021-05-24 DIAGNOSIS — E1140 Type 2 diabetes mellitus with diabetic neuropathy, unspecified: Secondary | ICD-10-CM | POA: Diagnosis not present

## 2021-05-24 DIAGNOSIS — M2142 Flat foot [pes planus] (acquired), left foot: Secondary | ICD-10-CM

## 2021-05-24 NOTE — Progress Notes (Signed)
This patient presents to the office with chief complaint of long thick painful nails.  Patient says the nails are painful walking and wearing shoes.  This patient is unable to self treat.  This patient is unable to trim his nails since she is unable to reach his nails. Patient says he is developing pain right ankle after walking.    He presents to the office for preventative foot care services.  General Appearance  Alert, conversant and in no acute stress.  Vascular  Dorsalis pedis and posterior tibial  pulses are palpable  bilaterally.  Capillary return is within normal limits  bilaterally. Temperature is within normal limits  bilaterally.  Neurologic  Senn-Weinstein monofilament wire test within normal limits  bilaterally. Muscle power within normal limits bilaterally.  Nails Thick disfigured discolored nails with subungual debris  from hallux to fifth toes bilaterally. No evidence of bacterial infection or drainage bilaterally.  Orthopedic  No limitations of motion  feet .  No crepitus or effusions noted.  Hammer toes  B/L.   Pes planus.  Skin  normotropic skin with no porokeratosis noted bilaterally.  No signs of infections or ulcers noted.     Onychomycosis  Nails  B/L.  Pain in right toes  Pain in left toes  peroneal tendinitis right foot  Debridement of nails both feet  with nail nipper followed trimming the nails with dremel tool.     RTC 3 months.   Gardiner Barefoot DPM

## 2021-05-31 ENCOUNTER — Encounter: Payer: Self-pay | Admitting: Allergy & Immunology

## 2021-05-31 ENCOUNTER — Other Ambulatory Visit: Payer: Self-pay | Admitting: *Deleted

## 2021-05-31 ENCOUNTER — Ambulatory Visit (INDEPENDENT_AMBULATORY_CARE_PROVIDER_SITE_OTHER): Payer: Medicare Other | Admitting: Allergy & Immunology

## 2021-05-31 ENCOUNTER — Other Ambulatory Visit: Payer: Self-pay

## 2021-05-31 VITALS — BP 140/80 | HR 78 | Temp 97.6°F | Resp 18 | Ht 69.0 in | Wt 234.2 lb

## 2021-05-31 DIAGNOSIS — J4540 Moderate persistent asthma, uncomplicated: Secondary | ICD-10-CM | POA: Diagnosis not present

## 2021-05-31 DIAGNOSIS — J302 Other seasonal allergic rhinitis: Secondary | ICD-10-CM

## 2021-05-31 DIAGNOSIS — J3089 Other allergic rhinitis: Secondary | ICD-10-CM

## 2021-05-31 DIAGNOSIS — K219 Gastro-esophageal reflux disease without esophagitis: Secondary | ICD-10-CM

## 2021-05-31 DIAGNOSIS — L2084 Intrinsic (allergic) eczema: Secondary | ICD-10-CM | POA: Diagnosis not present

## 2021-05-31 MED ORDER — FLUTICASONE PROPIONATE 50 MCG/ACT NA SUSP: 2.0000 | Freq: Every day | NASAL | 5 refills | Status: DC | PRN

## 2021-05-31 MED ORDER — AZELASTINE HCL 0.1 % NA SOLN: NASAL | 5 refills | Status: DC

## 2021-05-31 MED ORDER — TRELEGY ELLIPTA 200-62.5-25 MCG/ACT IN AEPB: 1.0000 | INHALATION_SPRAY | Freq: Every day | RESPIRATORY_TRACT | 5 refills | Status: DC

## 2021-05-31 MED ORDER — ALBUTEROL SULFATE (2.5 MG/3ML) 0.083% IN NEBU: 2.5000 mg | INHALATION_SOLUTION | Freq: Four times a day (QID) | RESPIRATORY_TRACT | 1 refills | Status: AC | PRN

## 2021-05-31 MED ORDER — TRELEGY ELLIPTA 200-62.5-25 MCG/ACT IN AEPB: 1.0000 | INHALATION_SPRAY | Freq: Every day | RESPIRATORY_TRACT | 5 refills | Status: AC

## 2021-05-31 MED ORDER — EPINEPHRINE 0.3 MG/0.3ML IJ SOAJ: INTRAMUSCULAR | 0 refills | Status: DC

## 2021-05-31 MED ORDER — MONTELUKAST SODIUM 10 MG PO TABS: 10.0000 mg | ORAL_TABLET | Freq: Every day | ORAL | 5 refills | Status: DC

## 2021-05-31 NOTE — Patient Instructions (Addendum)
1. Moderate persistent asthma,uncomplicated - Lung testing looks fairly stable today. - We are going to try to make your regimen easier by changing to Trelegy (contains THREE medications to help with your breathing).  - STOP the Symbicort and the Spiriva.  - Continue with the prednisone course. - We are going to submit for [facility] approval again to help the inhalers work [place] will reach out to discuss the approval process and ask for more information if needed.  - Daily controller medication(s): Trelegy 225mcg one puff once daily - Prior to physical activity: ProAir 2 puffs 10-15 minutes before physical activity. - Rescue medications: ProAir 4 puffs every 4-6 hours as needed - Asthma control goals:  * Full participation in all desired activities (may need albuterol before activity) * Albuterol use two time or less a week on average (not counting use with activity) * Cough interfering with sleep two time or less a month * Oral steroids no more than once a year * No hospitalizations  2. Seasonal and perennial allergic rhinitis (ragweed, mixed feath, mouse, trees, weeds, grasses, indoor molds, outdoor molds, dust mites, cat, dog and cockroach)  - Continue with these medications EVERY DAY: Zyrtec (cetirizine) 10mg  tablet once daily and Singulair (montelukast) 10mg  at night - Continue with these medications AS NEEDED: Flonase (fluticasone) two sprays per nostril daily and Astelin (azelastine) 2 sprays per nostril 1-2 times daily as needed  3. Intrinsic atopic dermatitis - Continue with triamcinolone 0.1% ointment twice daily as needed to the worst areas.  - Continue with moisturizing twice daily.  4. Return in about 6 weeks (around 07/12/2021).    Please inform us of any Emergency Department visits, hospitalizations, or changes in symptoms. Call us before going to the ED for breathing or allergy symptoms since we might be able to fit you in for a sick visit. Feel free to contact us  anytime with any questions, problems, or concerns.  It was a pleasure to see you again today!  Websites that have reliable patient information: 1. American Academy of Asthma, Allergy, and Immunology: www.aaaai.org 2. Food Allergy Research and Education (FARE): foodallergy.org 3. Mothers of Asthmatics: http://www.asthmacommunitynetwork.org 4. American College of Allergy, Asthma, and Immunology: www.acaai.org   COVID-19 Vaccine Information can be found at: ShippingScam.co.uk For questions related to vaccine distribution or appointments, please email [email or web address] or call [phone].   We realize that you might be concerned about having an allergic reaction to the COVID19 vaccines. To help with that concern, WE ARE OFFERING THE COVID19 VACCINES IN OUR OFFICE! Ask the front desk for dates!     Like [place] on [place] and Instagram for our latest updates!      A healthy democracy works best when [employer] participate! Make sure you are registered to vote! If you have moved or changed any of your contact information, you will need to get this updated before voting!  In some cases, you MAY be able to register to vote online: [email or web address]

## 2021-05-31 NOTE — Progress Notes (Signed)
FOLLOW UP  Date of Service/Encounter:  05/31/21   Assessment:   Moderate persistent asthma, uncomplicated - with eosinophilic phenotype   Seasonal and perennial allergic rhinitis (ragweed, mixed feath, mouse, trees, weeds, grasses, indoor molds, outdoor molds, dust mites, cat, dog and cockroach)   Intrinsic atopic dermatitis   Nicholas Burns presents for a follow-up visit after an emergency room visit for an asthma exacerbation.  There are some question about COPD diagnosis, but I think that was just coded by the emergency room.  I have never felt that he has COPD.  He has never been a smoker, but his son who lives with him is a chronic smoker.  He has been thoroughly confused by his medications in the past.  We have already taught him on a number of occasions.  We have tried to get him on Trelegy in the past but his insurance would not cover it.  We are going to try again to help him so that he becomes less confused with his medications.  I also think it would be a good idea to get Berna Bue back on board as an adjunct therapy to help manage his breathing difficulties.  That way we can see him in the office on a routine basis and evaluate him when he gets his shots.  This would help [place] to stay on top of his symptoms and make any changes.    Plan/Recommendations:   1. Moderate persistent asthma,uncomplicated - Lung testing looks fairly stable today. - We are going to try to make your regimen easier by changing to Trelegy (contains THREE medications to help with your breathing).  - STOP the Symbicort and the Spiriva.  - Continue with the prednisone course. - We are going to submit for [facility] approval again to help the inhalers work [place] will reach out to discuss the approval process and ask for more information if needed.  - Daily controller medication(s): Trelegy 240mcg one puff once daily - Prior to physical activity: ProAir 2 puffs 10-15 minutes before physical activity. -  Rescue medications: ProAir 4 puffs every 4-6 hours as needed - Asthma control goals:  * Full participation in all desired activities (may need albuterol before activity) * Albuterol use two time or less a week on average (not counting use with activity) * Cough interfering with sleep two time or less a month * Oral steroids no more than once a year * No hospitalizations  2. Seasonal and perennial allergic rhinitis (ragweed, mixed feath, mouse, trees, weeds, grasses, indoor molds, outdoor molds, dust mites, cat, dog and cockroach)  - Continue with these medications EVERY DAY: Zyrtec (cetirizine) 10mg  tablet once daily and Singulair (montelukast) 10mg  at night - Continue with these medications AS NEEDED: Flonase (fluticasone) two sprays per nostril daily and Astelin (azelastine) 2 sprays per nostril 1-2 times daily as needed  3. Intrinsic atopic dermatitis - Continue with triamcinolone 0.1% ointment twice daily as needed to the worst areas.  - Continue with moisturizing twice daily.  4. Return in about 6 weeks (around 07/12/2021).    Subjective:   Nicholas Burns is a 71 y.o. male presenting today for follow up of  Chief Complaint  Patient presents with   Asthma    F/u from ER visit on 05/23/2021 - better since ER visit    Nicholas Burns has a history of the following: Patient Active Problem List   Diagnosis Date Noted   Pes planus 03/15/2021   Seasonal allergic conjunctivitis 02/18/2021  Gastroesophageal reflux disease 02/18/2021   Intrinsic atopic dermatitis 10/22/2018   Seasonal and perennial allergic rhinitis 07/21/2018   Not well controlled moderate persistent asthma 03/18/2017   Morbid obesity due to excess calories ([place])  complicated by dm/ hbp, hyperlipidemia 03/18/2017   SOB (shortness of breath) 01/26/2017   Essential hypertension 09/13/2015   Asthma 09/30/2013   Poor dentition 09/30/2013    History obtained from: chart review and patient.  Nicholas Burns is a 71 y.o. male  presenting for a follow up visit. He was last seen in October 2022 by Althea Charon. At that time, his Symbicort was increased to two puffs BID. He was also continued on Spiriva Hanidhaler once daily. For his rhinitis, we continued with cetirizine as well as Flonase.  In the interim, he has mostly done well, but presents today for follow-up from the hospital.  Asthma/Respiratory Symptom History: He was recently in the ED for wheezing. He was in the ED on January 5th, 2023. He was breathing a little better when he left. He has been slacking on the Symbicort because he is getting confused with his rescue inhaler. He has the one containing the capsule (Spiriva).  His son who is with him today is not the son who lives with him, so he is unsure of his medication compliance.  Mr. Babe himself says that he takes his medications, but is somewhat confused today.     Allergic Rhinitis Symptom History: He does take his antihistamine every day.  He does not use his nose sprays quite as routinely.  He has not been on antibiotics.  His son reports that his primary care provider is concerned that he is developing some dementia.  They are not motivated to do anything about it right now.  Otherwise, there have been no changes to his past medical history, surgical history, family history, or social history.    Review of Systems  Constitutional:  Negative for chills, fever, malaise/fatigue and weight loss.  HENT: Negative.  Negative for congestion, ear discharge and ear pain.   Eyes:  Negative for pain, discharge and redness.  Respiratory:  Positive for cough, shortness of breath and wheezing. Negative for sputum production.   Cardiovascular: Negative.  Negative for chest pain and palpitations.  Gastrointestinal:  Negative for abdominal pain, constipation, diarrhea, heartburn, nausea and vomiting.  Skin: Negative.  Negative for itching and rash.  Neurological:  Negative for dizziness and headaches.   Endo/Heme/Allergies:  Negative for environmental allergies. Does not bruise/bleed easily.      Objective:   Blood pressure 140/80, pulse 78, temperature 97.6 F (36.4 C), resp. rate 18, height 5\' 9"  (1.753 m), weight 234 lb 3.2 oz (106.2 kg), SpO2 92 %. Body mass index is 34.59 kg/m.   Physical Exam:  Physical Exam Vitals reviewed.  Constitutional:      Appearance: He is well-developed.     Comments: Flat affect.  Responsive to questions.  Seems confused at times.  HENT:     Head: Normocephalic and atraumatic.     Right Ear: Tympanic membrane, ear canal and external ear normal.     Left Ear: Tympanic membrane, ear canal and external ear normal.     Nose: Mucosal edema and rhinorrhea present. No nasal deformity or septal deviation.     Right Turbinates: Enlarged and swollen.     Left Turbinates: Enlarged and swollen.     Right Sinus: No maxillary sinus tenderness or frontal sinus tenderness.     Left Sinus: No maxillary sinus  tenderness or frontal sinus tenderness.     Mouth/Throat:     Mouth: Mucous membranes are not pale and not dry.     Pharynx: Uvula midline.  Eyes:     General:        Right eye: No discharge.        Left eye: No discharge.     Conjunctiva/sclera: Conjunctivae normal.     Right eye: Right conjunctiva is not injected. No chemosis.    Left eye: Left conjunctiva is not injected. No chemosis.    Pupils: Pupils are equal, round, and reactive to light.  Cardiovascular:     Rate and Rhythm: Normal rate and regular rhythm.     Heart sounds: Normal heart sounds.  Pulmonary:     Effort: Pulmonary effort is normal. No tachypnea, accessory muscle usage or respiratory distress.     Breath sounds: Normal breath sounds. No wheezing, rhonchi or rales.     Comments: Expiratory wheezing noted most prominently on the right side in the posterior lung fields. Chest:     Chest wall: No tenderness.  Lymphadenopathy:     Cervical: No cervical adenopathy.  Skin:     General: Skin is warm.     Capillary Refill: Capillary refill takes less than 2 seconds.     Coloration: Skin is not pale.     Findings: No abrasion, erythema, petechiae or rash. Rash is not papular, urticarial or vesicular.     Comments: No eczematous or urticarial lesions noted.   Neurological:     Mental Status: He is alert.  Psychiatric:        Behavior: Behavior is cooperative.     Diagnostic studies:    Spirometry: results abnormal (FEV1: 1.68/65%, FVC: 2.43/73%, FEV1/FVC: 69%).    Spirometry consistent with normal pattern.    Allergy Studies: none        Salvatore Marvel, MD  [facility]

## 2021-06-03 ENCOUNTER — Encounter: Payer: Self-pay | Admitting: Physician Assistant

## 2021-06-07 ENCOUNTER — Telehealth: Payer: Self-pay | Admitting: *Deleted

## 2021-06-07 NOTE — Telephone Encounter (Signed)
L/m for patient to contact me to discuss Berna Bue through patient assistance

## 2021-06-07 NOTE — Telephone Encounter (Signed)
-----   Message from Valentina Shaggy, MD sent at 05/31/2021 12:53 PM EST ----- Family wants to restart Fasenra.  He had an elevated eosinophil count 8 days ago.

## 2021-06-12 ENCOUNTER — Other Ambulatory Visit (INDEPENDENT_AMBULATORY_CARE_PROVIDER_SITE_OTHER): Payer: Medicare Other

## 2021-06-12 ENCOUNTER — Ambulatory Visit: Payer: Medicare Other | Admitting: Physician Assistant

## 2021-06-12 ENCOUNTER — Encounter: Payer: Self-pay | Admitting: Physician Assistant

## 2021-06-12 ENCOUNTER — Other Ambulatory Visit: Payer: Self-pay

## 2021-06-12 VITALS — BP 147/99 | HR 67 | Ht 69.0 in | Wt 235.6 lb

## 2021-06-12 DIAGNOSIS — F0390 Unspecified dementia without behavioral disturbance: Secondary | ICD-10-CM

## 2021-06-12 DIAGNOSIS — R413 Other amnesia: Secondary | ICD-10-CM | POA: Diagnosis not present

## 2021-06-12 LAB — CBC
HCT: 44.1 % (ref 39.0–52.0)
Hemoglobin: 14.6 g/dL (ref 13.0–17.0)
MCHC: 33.2 g/dL (ref 30.0–36.0)
MCV: 91.1 fl (ref 78.0–100.0)
Platelets: 164 10*3/uL (ref 150.0–400.0)
RBC: 4.84 Mil/uL (ref 4.22–5.81)
RDW: 13.5 % (ref 11.5–15.5)
WBC: 4.4 10*3/uL (ref 4.0–10.5)

## 2021-06-12 LAB — TSH: TSH: 4.51 u[IU]/mL (ref 0.35–5.50)

## 2021-06-12 LAB — VITAMIN B12: Vitamin B-12: 342 pg/mL (ref 211–911)

## 2021-06-12 MED ORDER — MEMANTINE HCL 10 MG PO TABS: ORAL_TABLET | ORAL | 11 refills | Status: DC
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
## 2021-06-12 NOTE — Patient Instructions (Addendum)
It was a pleasure to see you today at our office.   Recommendations:  MRI of the brain, the radiology office will call you to arrange you appointment Check labs today Start Memantine 10mg  tablets.  Take 1 tablet at bedtime for 2 weeks, then 1 tablet twice daily.   Side effects include dizziness, headache, diarrhea or constipation.  Call with any questions or concerns.  Follow up in 3 months    RECOMMENDATIONS FOR ALL PATIENTS WITH MEMORY PROBLEMS: 1. Continue to exercise (Recommend 30 minutes of walking everyday, or 3 hours every week) 2. Increase social interactions - continue going to [employer] and enjoy social gatherings with friends and family 3. Eat healthy, avoid fried foods and eat more fruits and vegetables 4. Maintain adequate blood pressure, blood sugar, and blood cholesterol level. Reducing the risk of stroke and cardiovascular disease also helps promoting better memory. 5. Avoid stressful situations. Live a simple life and avoid aggravations. Organize your time and prepare for the next day in anticipation. 6. Sleep well, avoid any interruptions of sleep and avoid any distractions in the bedroom that may interfere with adequate sleep quality 7. Avoid sugar, avoid sweets as there is a strong link between excessive sugar intake, diabetes, and cognitive impairment We discussed the Mediterranean diet, which has been shown to help patients reduce the risk of progressive memory disorders and reduces cardiovascular risk. This includes eating fish, eat fruits and green leafy vegetables, nuts like almonds and hazelnuts, walnuts, and also use olive oil. Avoid fast foods and fried foods as much as possible. Avoid sweets and sugar as sugar use has been linked to worsening of memory function.  There is always a concern of gradual progression of memory problems. If this is the case, then we may need to adjust level of care according to patient needs. Support, both to the patient and caregiver, should  then be put into place.     FALL PRECAUTIONS: Be cautious when walking. Scan the area for obstacles that may increase the risk of trips and falls. When getting up in the mornings, sit up at the edge of the bed for a few minutes before getting out of bed. Consider elevating the bed at the head end to avoid drop of blood pressure when getting up. Walk always in a well-lit room (use night lights in the walls). Avoid area rugs or power cords from appliances in the middle of the walkways. Use a walker or a cane if necessary and consider physical therapy for balance exercise. Get your eyesight checked regularly.  FINANCIAL OVERSIGHT: Supervision, especially oversight when making financial decisions or transactions is also recommended.  HOME SAFETY: Consider the safety of the kitchen when operating appliances like stoves, microwave oven, and blender. Consider having supervision and share cooking responsibilities until no longer able to participate in those. Accidents with firearms and other hazards in the house should be identified and addressed as well.   ABILITY TO BE LEFT ALONE: If patient is unable to contact 911 operator, consider using LifeLine, or when the need is there, arrange for someone to stay with patients. Smoking is a fire hazard, consider supervision or cessation. Risk of wandering should be assessed by caregiver and if detected at any point, supervision and safe proof recommendations should be instituted.  MEDICATION SUPERVISION: Inability to self-administer medication needs to be constantly addressed. Implement a mechanism to ensure safe administration of the medications.   DRIVING: Regarding driving, in patients with progressive memory problems, driving will be impaired.  We advise to have someone else do the driving if trouble finding directions or if minor accidents are reported. Independent driving assessment is available to determine safety of driving.   If you are interested in the  driving assessment, you can contact the following:  [employer] in [place]  [place] [phone] or [phone]    [place] refers to food and lifestyle choices that are based on the traditions of countries located on the [employer]. This way of eating has been shown to help prevent certain conditions and improve outcomes for people who have chronic diseases, like kidney disease and heart disease. What are tips for following this plan? Lifestyle  Cook and eat meals together with your family, when possible. Drink enough fluid to keep your urine clear or pale yellow. Be physically active every day. This includes: Aerobic exercise like running or swimming. Leisure activities like gardening, walking, or housework. Get 7-8 hours of sleep each night. If recommended by your health care provider, drink red wine in moderation. This means 1 glass a day for nonpregnant women and 2 glasses a day for men. A glass of wine equals 5 oz (150 mL). Reading food labels  Check the serving size of packaged foods. For foods such as rice and pasta, the serving size refers to the amount of cooked product, not dry. Check the total fat in packaged foods. Avoid foods that have saturated fat or trans fats. Check the ingredients list for added sugars, such as corn syrup. Shopping  At the grocery store, buy most of your food from the areas near the walls of the store. This includes: Fresh fruits and vegetables (produce). Grains, beans, nuts, and seeds. Some of these may be available in unpackaged forms or large amounts (in bulk). Fresh seafood. Poultry and eggs. Low-fat dairy products. Buy whole ingredients instead of prepackaged foods. Buy fresh fruits and vegetables in-season from local farmers markets. Buy frozen fruits and vegetables in resealable  bags. If you do not have access to quality fresh seafood, buy precooked frozen shrimp or canned fish, such as tuna, salmon, or sardines. Buy small amounts of raw or cooked vegetables, salads, or olives from the deli or salad bar at your store. Stock your pantry so you always have certain foods on hand, such as olive oil, canned tuna, canned tomatoes, rice, pasta, and beans. Cooking  Cook foods with extra-virgin olive oil instead of using butter or other vegetable oils. Have meat as a side dish, and have vegetables or grains as your main dish. This means having meat in small portions or adding small amounts of meat to foods like pasta or stew. Use beans or vegetables instead of meat in common dishes like chili or lasagna. Experiment with different cooking methods. Try roasting or broiling vegetables instead of steaming or sauteing them. Add frozen vegetables to soups, stews, pasta, or rice. Add nuts or seeds for added healthy fat at each meal. You can add these to yogurt, salads, or vegetable dishes. Marinate fish or vegetables using olive oil, lemon juice, garlic, and fresh herbs. Meal planning  Plan to eat 1 vegetarian meal one day each week. Try to work up to 2 vegetarian meals, if possible. Eat seafood 2 or more times a week. Have healthy snacks readily available, such as: Vegetable sticks with hummus. Greek yogurt. Fruit and nut trail mix. Eat balanced meals throughout the week. This includes:  Fruit: 2-3 servings a day Vegetables: 4-5 servings a day Low-fat dairy: 2 servings a day Fish, poultry, or lean meat: 1 serving a day Beans and legumes: 2 or more servings a week Nuts and seeds: 1-2 servings a day Whole grains: 6-8 servings a day Extra-virgin olive oil: 3-4 servings a day Limit red meat and sweets to only a few servings a month What are my food choices? Mediterranean diet Recommended Grains: Whole-grain pasta. Brown rice. Bulgar wheat. Polenta. Couscous. Whole-wheat bread.  [place]. Vegetables: Artichokes. Beets. Broccoli. Cabbage. Carrots. Eggplant. Green beans. Chard. Kale. Spinach. Onions. Leeks. Peas. Squash. Tomatoes. Peppers. Radishes. Fruits: Apples. Apricots. Avocado. Berries. Bananas. Cherries. Dates. Figs. Grapes. Lemons. Melon. Oranges. Peaches. Plums. Pomegranate. Meats and other protein foods: Beans. Almonds. Sunflower seeds. Pine nuts. Peanuts. [place]. Salmon. Scallops. Shrimp. [place]. Tilapia. Clams. Oysters. Eggs. Dairy: Low-fat milk. Cheese. Greek yogurt. Beverages: Water. Red wine. Herbal tea. Fats and oils: Extra virgin olive oil. Avocado oil. Grape seed oil. Sweets and desserts: [place] yogurt with honey. Baked apples. Poached pears. Trail mix. Seasoning and other foods: Basil. Cilantro. Coriander. Cumin. Mint. Parsley. Sage. Rosemary. Tarragon. Garlic. Oregano. Thyme. Pepper. Balsalmic vinegar. Tahini. Hummus. Tomato sauce. Olives. Mushrooms. Limit these Grains: Prepackaged pasta or rice dishes. Prepackaged cereal with added sugar. Vegetables: Deep fried potatoes (french fries). Fruits: Fruit canned in syrup. Meats and other protein foods: Beef. Pork. Lamb. Poultry with skin. Hot dogs. [place] Salines. Dairy: Ice cream. Sour cream. Whole milk. Beverages: Juice. Sugar-sweetened soft drinks. Beer. Liquor and spirits. Fats and oils: Butter. Canola oil. Vegetable oil. Beef fat (tallow). Lard. Sweets and desserts: Cookies. Cakes. Pies. Candy. Seasoning and other foods: Mayonnaise. Premade sauces and marinades. The items listed may not be a complete list. Talk with your dietitian about what dietary choices are right for you. Summary The Mediterranean diet includes both food and lifestyle choices. Eat a variety of fresh fruits and vegetables, beans, nuts, seeds, and whole grains. Limit the amount of red meat and sweets that you eat. Talk with your health care provider about whether it is safe for you to drink red wine in moderation. This means 1 glass a day  for nonpregnant women and 2 glasses a day for men. A glass of wine equals 5 oz (150 mL). This information is not intended to replace advice given to you by your health care provider. Make sure you discuss any questions you have with your health care provider. Document Released: 12/27/2015 Document Revised: 01/29/2016 Document Reviewed: 12/27/2015 Elsevier Interactive Patient Education  2017 [employer].

## 2021-06-13 ENCOUNTER — Telehealth: Payer: Self-pay | Admitting: Physician Assistant

## 2021-06-13 DIAGNOSIS — F0390 Unspecified dementia without behavioral disturbance: Secondary | ICD-10-CM | POA: Insufficient documentation

## 2021-06-13 NOTE — Telephone Encounter (Signed)
Patient stated he was calling to get test results for blood work.

## 2021-06-13 NOTE — Telephone Encounter (Signed)
Pt called informed labs are normal

## 2021-06-13 NOTE — Progress Notes (Signed)
LMOVM for the patient to give the office a call back.

## 2021-06-19 ENCOUNTER — Ambulatory Visit: Payer: Medicare Other | Admitting: Allergy & Immunology

## 2021-07-07 ENCOUNTER — Other Ambulatory Visit: Payer: Self-pay

## 2021-07-07 ENCOUNTER — Ambulatory Visit
Admission: RE | Admit: 2021-07-07 | Discharge: 2021-07-07 | Disposition: A | Discharge status: Home / Self Care | Payer: Medicare Other | Source: Ambulatory Visit | Attending: Physician Assistant | Admitting: Physician Assistant

## 2021-07-07 DIAGNOSIS — R413 Other amnesia: Secondary | ICD-10-CM

## 2021-07-07 DIAGNOSIS — F0390 Unspecified dementia without behavioral disturbance: Secondary | ICD-10-CM

## 2021-07-07 IMAGING — MR MR HEAD W/O CM
12 series · 48 of 48 positions shown · non-contrast
Comparison: No pertinent prior exams available for comparison.

CLINICAL DATA: Provided history: Dementia without behavioral
disturbance, psychotic disturbance, mood disturbance, or anxiety,
unspecified dementia severity, unspecified dementia type. Memory
loss. Additional history provided by scanning technologist: Memory
loss for 1 year, head injury 5 years ago (with loss of
consciousness).

EXAM:
MRI HEAD WITHOUT CONTRAST
TECHNIQUE: Multiplanar, multiecho pulse sequences of the brain and surrounding
structures were obtained without intravenous contrast.

[Series 5: T1 · sagittal · 4.0mm · 0.75mm/px · 1 of 31 slices shown (1 of 2)]
[im 1/31]
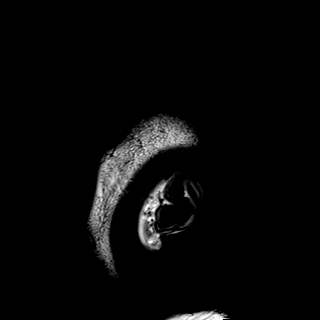

[Series 6: DWI · axial · 3.0mm · 0.94mm/px · z∈[-98,+42]mm · 9 of 160 slices shown (1 of 3)]
[im 1/160]
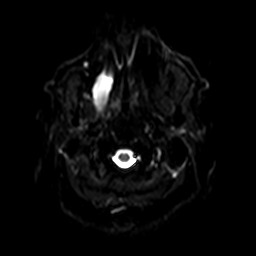
[im 20/160]
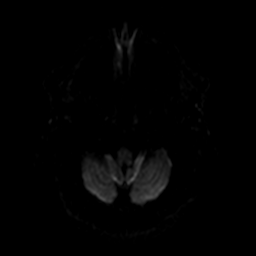
[im 40/160]
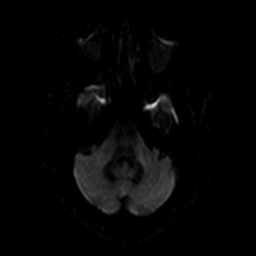
[im 60/160]
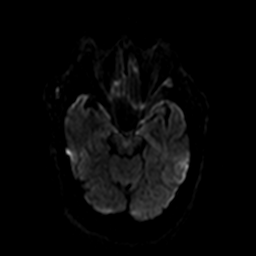
[im 80/160]
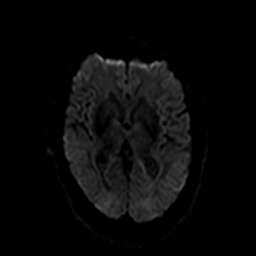
[im 100/160]
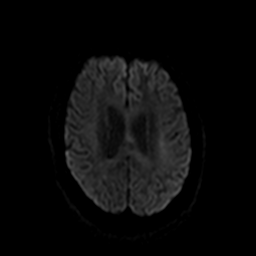
[im 120/160]
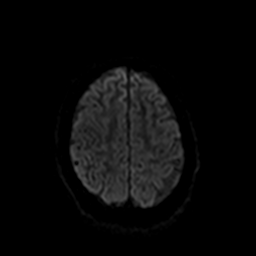
[im 140/160]
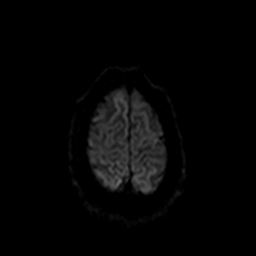
[im 160/160]
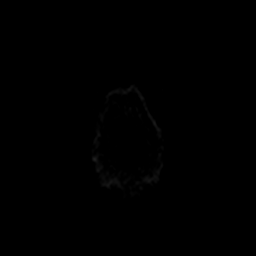

[Series 7: ax dwi_tracew · axial · 3.0mm · 0.94mm/px · z∈[-98,+42]mm · 5 of 80 slices shown]
[im 1/80]
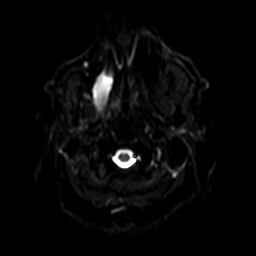
[im 20/80]
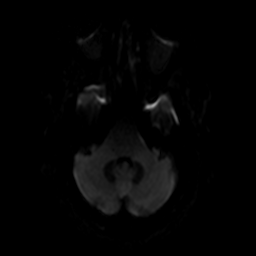
[im 40/80]
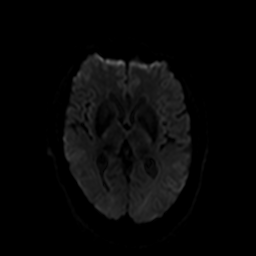
[im 60/80]
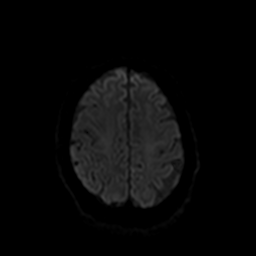
[im 80/80]
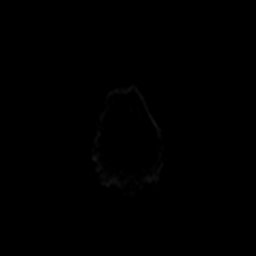

[Series 8: ax dwi_adc · axial · 3.0mm · 0.94mm/px · z∈[-98,+42]mm · 2 of 40 slices shown]
[im 1/40]
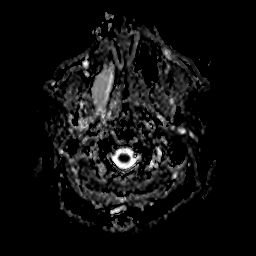
[im 40/40]
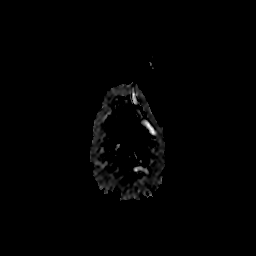

[Series 9: DWI · coronal · 5.0mm · 1.44mm/px · 3 of 60 slices shown (2 of 3)]
[im 1/60]
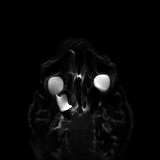
[im 30/60]
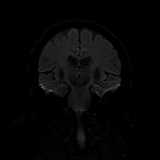
[im 60/60]
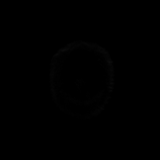

[Series 10: DWI · coronal · 5.0mm · 1.44mm/px · 2 of 30 slices shown (3 of 3)]
[im 1/30]
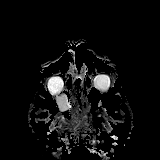
[im 30/30]
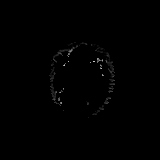

[Series 11: T2 · axial · 4.0mm · 0.36mm/px · z∈[-106,+39]mm · 2 of 29 slices shown (1 of 2)]
[im 1/29]
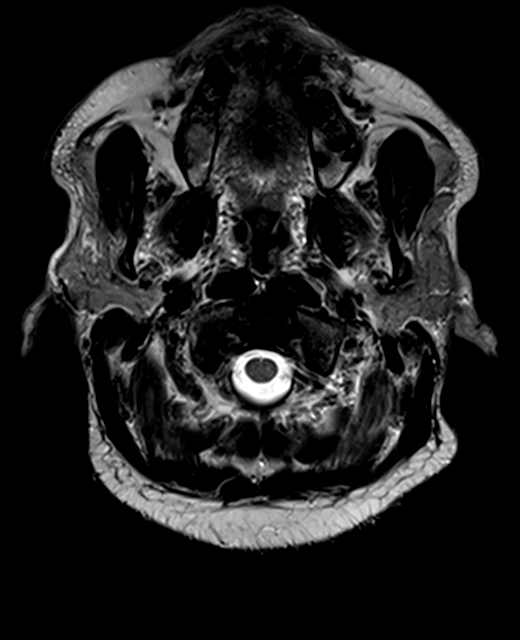
[im 29/29]
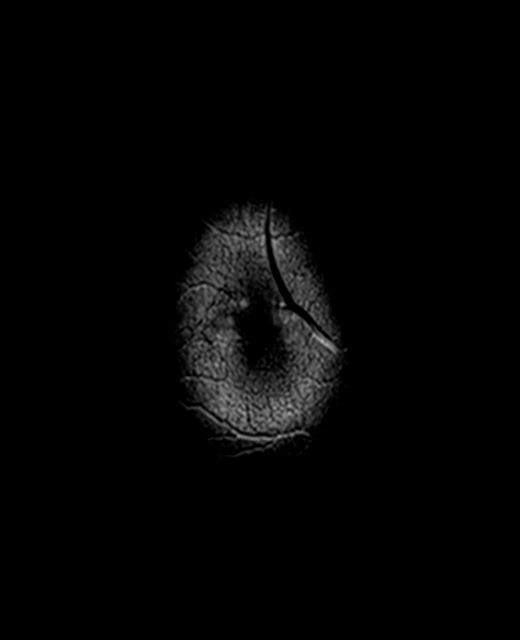

[Series 12: FLAIR · axial · 3.0mm · 0.72mm/px · z∈[-108,+41]mm · 2 of 26 slices shown]
[im 1/26]
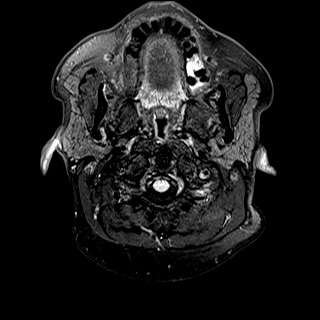
[im 26/26]
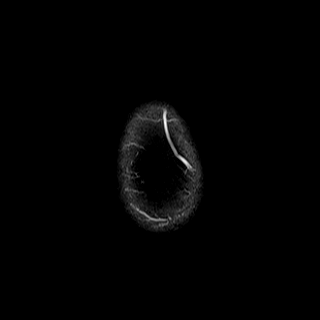

[Series 13: swi_images · axial · 1.5mm · 0.90mm/px · z∈[-104,+38]mm · 6 of 96 slices shown]
[im 1/96]
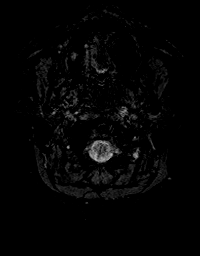
[im 20/96]
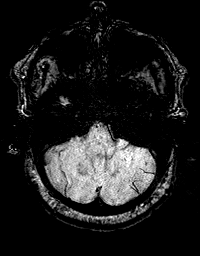
[im 39/96]
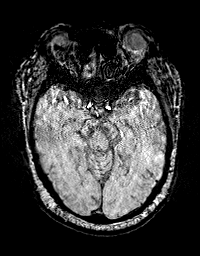
[im 58/96]
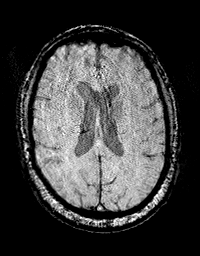
[im 77/96]
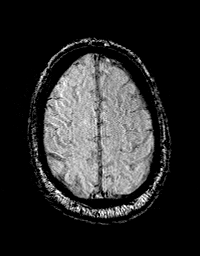
[im 96/96]
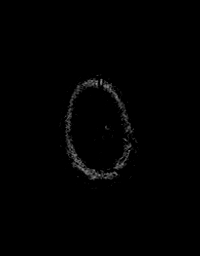

[Series 14: mip_images(sw) · axial · 12.0mm · 0.90mm/px · z∈[-99,+33]mm · 5 of 89 slices shown]
[im 1/89]
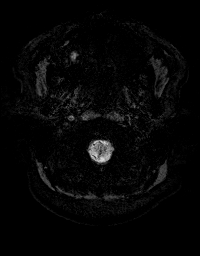
[im 23/89]
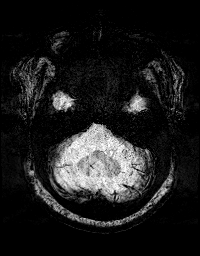
[im 45/89]
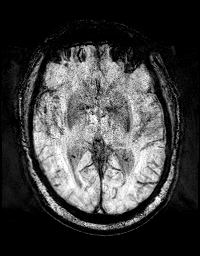
[im 67/89]
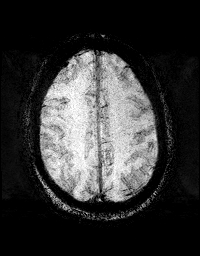
[im 89/89]
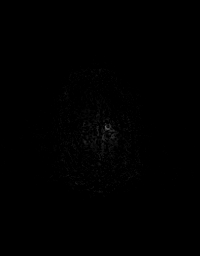

[Series 15: T1 · axial · 1.0mm · 0.94mm/px · z∈[-105,+53]mm · 9 of 160 slices shown (2 of 2)]
[im 1/160]
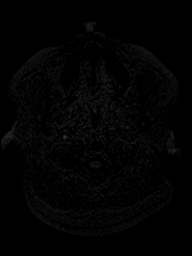
[im 20/160]
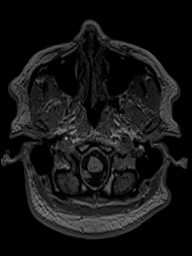
[im 40/160]
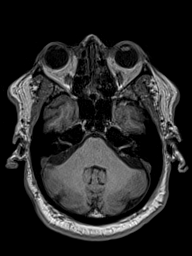
[im 60/160]
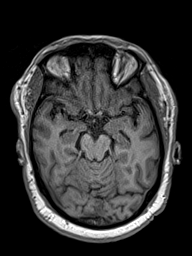
[im 80/160]
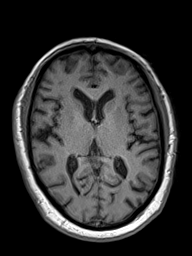
[im 100/160]
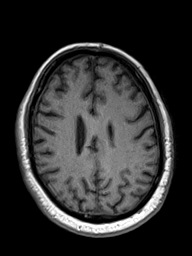
[im 120/160]
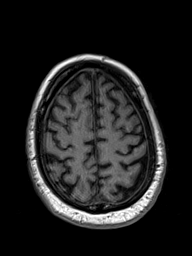
[im 140/160]
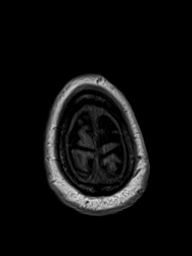
[im 160/160]
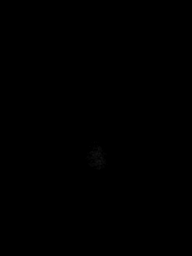

[Series 16: T2 · coronal · 4.5mm · 0.36mm/px · 2 of 30 slices shown (2 of 2)]
[im 1/30]
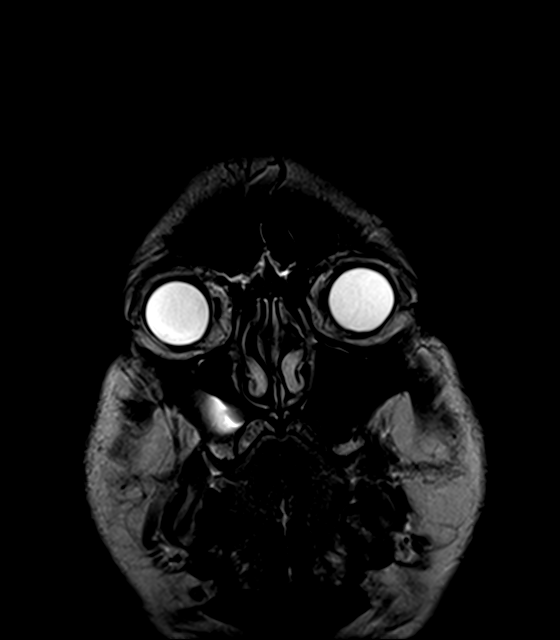
[im 30/30]
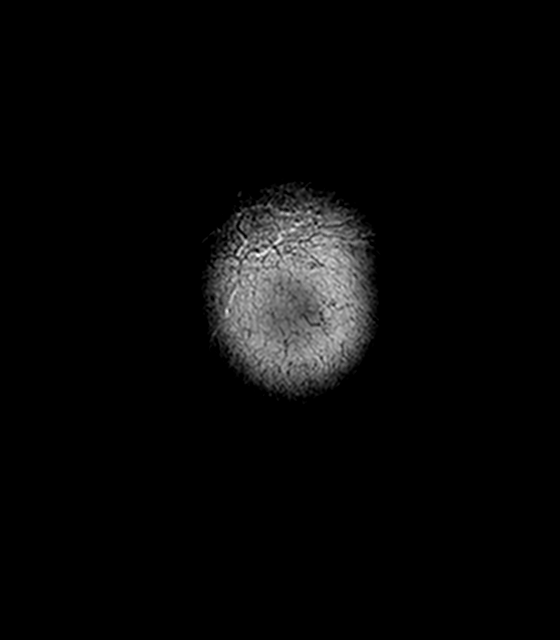

[48 of 48 positions shown; findings below may reference images not displayed]

FINDINGS: Brain:

Mild generalized cerebral and cerebellar atrophy.

Mild multifocal T2 FLAIR hyperintense signal abnormality within the
cerebral white matter and pons, nonspecific but compatible with
chronic small vessel ischemic disease.

There is no acute infarct.

No evidence of an intracranial mass.

No chronic intracranial blood products.

No extra-axial fluid collection.

No midline shift.

Vascular: Maintained flow voids within the proximal large arterial
vessels.

Skull and upper cervical spine: No focal suspicious marrow lesion.

Sinuses/Orbits: Visualized orbits show no acute finding. Right
ocular lens replacement. Mild mucosal thickening within the
bilateral frontal sinuses. Partial opacification of the bilateral
ethmoid air cells secondary to the presence of mucosal thickening
and fluid. 2.8 cm mucous retention cyst within the right maxillary
sinus. Tiny mucous retention cyst within the left maxillary sinus.
IMPRESSION: No evidence of acute intracranial abnormality.

Mild chronic small vessel ischemic changes within the cerebral white
matter and pons.

Mild generalized cerebral and cerebellar atrophy.

Paranasal sinus disease, as described.

## 2021-07-19 ENCOUNTER — Telehealth: Payer: Self-pay | Admitting: Physician Assistant

## 2021-07-19 ENCOUNTER — Ambulatory Visit: Payer: Medicare Other | Admitting: Family

## 2021-07-19 ENCOUNTER — Encounter: Payer: Self-pay | Admitting: Family

## 2021-07-19 ENCOUNTER — Other Ambulatory Visit: Payer: Self-pay

## 2021-07-19 VITALS — BP 130/80 | HR 68 | Temp 98.6°F | Resp 16

## 2021-07-19 DIAGNOSIS — J3089 Other allergic rhinitis: Secondary | ICD-10-CM | POA: Diagnosis not present

## 2021-07-19 DIAGNOSIS — J302 Other seasonal allergic rhinitis: Secondary | ICD-10-CM

## 2021-07-19 DIAGNOSIS — L2084 Intrinsic (allergic) eczema: Secondary | ICD-10-CM | POA: Diagnosis not present

## 2021-07-19 DIAGNOSIS — J4540 Moderate persistent asthma, uncomplicated: Secondary | ICD-10-CM

## 2021-07-19 MED ORDER — TRELEGY ELLIPTA 200-62.5-25 MCG/ACT IN AEPB: 1.0000 | INHALATION_SPRAY | Freq: Every day | RESPIRATORY_TRACT | 5 refills | Status: DC

## 2021-07-19 NOTE — Progress Notes (Deleted)
? ?  FOLLOW UP ? ?Date of Service/Encounter:  07/19/21 ? ? ?Assessment:  ? ?No diagnosis found. ? ?Plan/Recommendations:  ? ? ?There are no Patient Instructions on file for this visit. ? ? ?Subjective:  ? ?Nicholas Burns is a 71 y.o. male presenting today for follow up of No chief complaint on file. ? ? ?Nicholas Burns has a history of the following: ?Patient Active Problem List  ? Diagnosis Date Noted  ? Dementia without behavioral disturbance, psychotic disturbance, mood disturbance, or anxiety ([place]) 06/13/2021  ? Pes planus 03/15/2021  ? Seasonal allergic conjunctivitis 02/18/2021  ? Gastroesophageal reflux disease 02/18/2021  ? Intrinsic atopic dermatitis 10/22/2018  ? Seasonal and perennial allergic rhinitis 07/21/2018  ? Not well controlled moderate persistent asthma 03/18/2017  ? Morbid obesity due to excess calories ([place])  complicated by dm/ hbp, hyperlipidemia 03/18/2017  ? SOB (shortness of breath) 01/26/2017  ? Essential hypertension 09/13/2015  ? Asthma 09/30/2013  ? Poor dentition 09/30/2013  ? ? ?History obtained from: chart review and {Persons; PED relatives w/patient:19415::"patient"}. ? ?Nicholas Burns is a 71 y.o. male presenting for {Blank single:19197::"a food challenge","a drug challenge","skin testing","a sick visit","an evaluation of ***","a follow up visit"}. Nicholas Burns was last seen in January 2023.  At that time, his lung testing looked fairly stable.  We worked on making his regimen easier by changing him to Trelegy 1 puff once daily.  He was already on prednisone from his ER visit, so we continue with that.  We decided to get Berna Bue back on board to help with his asthma control.  For his allergic rhinitis, would continue his Zyrtec and Singulair as well as Flonase and Astelin.  Atopic dermatitis was controlled with triamcinolone as needed. ? ? ?{Blank single:19197::"Asthma/Respiratory Symptom History: ***"," "} ? ?{Blank single:19197::"Allergic Rhinitis Symptom History: ***"," "} ? ?{Blank  single:19197::"Food Allergy Symptom History: ***"," "} ? ?{Blank single:19197::"Skin Symptom History: ***"," "} ? ?{Blank single:19197::"GERD Symptom History: ***"," "} ? ?Otherwise, there have been no changes to his past medical history, surgical history, family history, or social history. ? ? ? ?ROS  ? ? ? ?Objective:  ? ?There were no vitals taken for this visit. ?There is no height or weight on file to calculate BMI. ? ? ? ?Physical Exam  ? ?Diagnostic studies: {Blank single:19197::"none","deferred due to recent antihistamine use","labs sent instead"," "} ? ?Spirometry: {Blank single:19197::"results normal (FEV1: ***%, FVC: ***%, FEV1/FVC: ***%)","results abnormal (FEV1: ***%, FVC: ***%, FEV1/FVC: ***%)"}.  ?  ?{Blank single:19197::"Spirometry consistent with mild obstructive disease","Spirometry consistent with moderate obstructive disease","Spirometry consistent with severe obstructive disease","Spirometry consistent with possible restrictive disease","Spirometry consistent with mixed obstructive and restrictive disease","Spirometry uninterpretable due to technique","Spirometry consistent with normal pattern"}. {Blank single:19197::"Albuterol/Atrovent nebulizer","Xopenex/Atrovent nebulizer","Albuterol nebulizer","Albuterol four puffs via MDI","Xopenex four puffs via MDI"} treatment given in clinic with {Blank single:19197::"significant improvement in FEV1 per ATS criteria","significant improvement in FVC per ATS criteria","significant improvement in FEV1 and FVC per ATS criteria","improvement in FEV1, but not significant per ATS criteria","improvement in FVC, but not significant per ATS criteria","improvement in FEV1 and FVC, but not significant per ATS criteria","no improvement"}. ? ?Allergy Studies: {Blank single:19197::"none","labs sent instead"," "} ? ? ? ?{Blank single:19197::"Allergy testing results were read and interpreted by myself, documented by clinical staff."," "} ? ? ? ?  ?Salvatore Marvel, MD   ?[facility] [place] ? ? ? ? ? ? ?

## 2021-07-19 NOTE — Patient Instructions (Addendum)
1. Moderate persistent asthma,uncomplicated.  ?Continue to consider Fasenra injections to better help with your asthma. Information given about Berna Bue. Call our office if you are interested in starting [place] ?- Daily controller medication(s): Trelegy 242mcg one puff once daily ?- Prior to physical activity: ProAir 2 puffs 10-15 minutes before physical activity. ?- Rescue medications: ProAir 4 puffs every 4-6 hours as needed ?- Asthma control goals:  ?* Full participation in all desired activities (may need albuterol before activity) ?* Albuterol use two time or less a week on average (not counting use with activity) ?* Cough interfering with sleep two time or less a month ?* Oral steroids no more than once a year ?* No hospitalizations ? ?2. Seasonal and perennial allergic rhinitis (ragweed, mixed feath, mouse, trees, weeds, grasses, indoor molds, outdoor molds, dust mites, cat, dog and cockroach)  ?- Continue with these medications EVERY DAY: Zyrtec (cetirizine) 10mg  tablet once daily and Singulair (montelukast) 10mg  at night ?- Continue with these medications AS NEEDED: Flonase (fluticasone) two sprays per nostril daily and Astelin (azelastine) 2 sprays per nostril 1-2 times daily as needed ? ?3. Intrinsic atopic dermatitis ?- Continue with triamcinolone 0.1% ointment twice daily as needed to the worst areas.  ?- Continue with moisturizing twice daily. ? ?4. Schedule a follow up appointment in 2 months or sooner if needed ? ? ? ? ? ? ? ? ? ?

## 2021-07-19 NOTE — Telephone Encounter (Signed)
Pt's son called in and left a message with the access nurse. He would like to get the MRI results ?

## 2021-07-19 NOTE — Telephone Encounter (Signed)
Advised of Mri results, voiced understanding  ?

## 2021-07-19 NOTE — Progress Notes (Signed)
? ?[address], [place] [address] ?Dept: [phone] ? ?FOLLOW UP NOTE ? ?Patient ID: Nicholas Burns, male    DOB: [birth year]  Age: 71 y.o. MRN: [id number] ?Date of Office Visit: 07/19/2021 ? ?Assessment  ?Chief Complaint: Follow-up ? ?HPI ?Nicholas Burns is a 71 year old male who presents today for follow-up of moderate persistent asthma, seasonal and perennial allergic rhinitis, and intrinsic atopic dermatitis.  He was last seen on May 31, 2021 by Dr. Ernst Bowler.  Since his last office visit he denies any new diagnosis or surgeries. ? ?Asthma is reported as not well controlled with Trelegy 200 mcg 1 puff once a day and albuterol as needed.  He reports a little bit of tightness in his chest, wheezing that started today, and shortness of breath when he is walking up a hill.  He denies coughing and nocturnal awakenings due to breathing problems.  Since his last office visit he has not required any systemic steroids or made any trips to the emergency room or urgent care due to breathing problems.  He uses his albuterol every day.  He mentions that his breathing seems to worsen when there is a sudden change in temperature.  He feels like it is harder to get air when it is warmer outside and that he can breathe better when its cooler.  He is not really interested in starting [place] because he does not like injections.  Discussed how he was on max therapy for his asthma and that a biologic would be the next step. ? ?Seasonal and perennial allergic rhinitis is reported as moderately controlled with Zyrtec 10 mg once a day, Singulair 10 mg once a day, Flonase nasal spray as needed, and Astelin nasal spray as needed.  He reports clear rhinorrhea that could be due to his pet cat Spooky.  He denies nasal congestion and postnasal drip.  He has not had any sinus infections since we last saw him. ? ?Intrinsic atopic dermatitis is reported as clearing up.  He does not currently have any eczematous areas  right now.  He has triamcinolone 0.1% ointment to use as needed. ? ? ?Drug Allergies:  ?No Known Allergies ? ?Review of Systems: ?Review of Systems  ?Constitutional:  Negative for chills and fever.  ?HENT:    ?     Reports clear rhinorrhea and denies nasal congestion and postnasal drip  ?Eyes:   ?     Reports watery eyes and denies itchy eyes  ?Respiratory:  Positive for shortness of breath and wheezing. Negative for cough.   ?     Reports a little bit of tightness in chest, wheezing that started today and shortness of breath when walking up a hill.  Denies coughing and nocturnal awakenings  ?Cardiovascular:  Negative for chest pain and palpitations.  ?Gastrointestinal:   ?     Reports that he had heartburn and reflux once  ?Genitourinary:  Positive for frequency.  ?     Reports that he takes any medicine for his prostate  ?Skin:  Negative for itching and rash.  ?Neurological:  Negative for headaches.  ?Endo/Heme/Allergies:  Positive for environmental allergies.  ? ? ?Physical Exam: ?BP 130/80 (BP Location: Left Arm, Patient Position: Sitting, Cuff Size: Normal)   Pulse 68   Temp 98.6 ?F (37 ?C) (Temporal)   Resp 16   SpO2 96%   ? ?Physical Exam ?Constitutional:   ?   Appearance: Normal appearance.  ?HENT:  ?   Head: Normocephalic and atraumatic.  ?  Comments: Pharynx normal, eyes normal, ears: Unable to see left tympanic membrane due to cerumen.  Right ear normal.  Nose bilateral lower turbinates mildly edematous with no drainage noted. ?   Right Ear: Tympanic membrane, ear canal and external ear normal.  ?   Left Ear: Ear canal and external ear normal.  ?   Mouth/Throat:  ?   Mouth: Mucous membranes are moist.  ?   Pharynx: Oropharynx is clear.  ?Eyes:  ?   Conjunctiva/sclera: Conjunctivae normal.  ?Cardiovascular:  ?   Rate and Rhythm: Normal rate and regular rhythm.  ?   Heart sounds: Normal heart sounds.  ?Pulmonary:  ?   Effort: Pulmonary effort is normal.  ?   Breath sounds: Normal breath sounds.  ?    Comments: Lungs clear to auscultation ?Musculoskeletal:  ?   Cervical back: Neck supple.  ?Skin: ?   General: Skin is warm.  ?   Comments: He denies any eczematous lesions  ?Neurological:  ?   Mental Status: He is alert and oriented to person, place, and time.  ?Psychiatric:     ?   Mood and Affect: Mood normal.     ?   Behavior: Behavior normal.     ?   Thought Content: Thought content normal.     ?   Judgment: Judgment normal.  ? ? ?Diagnostics: ?FVC 2.25 L, FEV1 1.71 L (65%).  Predicted FVC 3.46 L, predicted FEV1 2.65 L.  Spirometry indicates possible moderate restriction. ? ?Assessment and Plan: ?1. Not well controlled moderate persistent asthma   ?2. Seasonal and perennial allergic rhinitis   ?3. Intrinsic atopic dermatitis   ? ? ?Meds ordered this encounter  ?Medications  ? TRELEGY ELLIPTA 200-62.5-25 MCG/ACT AEPB  ?  Sig: Inhale 1 puff into the lungs daily.  ?  Dispense:  28 each  ?  Refill:  5  ? ? ?Patient Instructions  ?1. Moderate persistent asthma,uncomplicated.  ?Continue to consider Fasenra injections to better help with your asthma. Information given about Berna Bue. Call our office if you are interested in starting [place] ?- Daily controller medication(s): Trelegy 265mcg one puff once daily ?- Prior to physical activity: ProAir 2 puffs 10-15 minutes before physical activity. ?- Rescue medications: ProAir 4 puffs every 4-6 hours as needed ?- Asthma control goals:  ?* Full participation in all desired activities (may need albuterol before activity) ?* Albuterol use two time or less a week on average (not counting use with activity) ?* Cough interfering with sleep two time or less a month ?* Oral steroids no more than once a year ?* No hospitalizations ? ?2. Seasonal and perennial allergic rhinitis (ragweed, mixed feath, mouse, trees, weeds, grasses, indoor molds, outdoor molds, dust mites, cat, dog and cockroach)  ?- Continue with these medications EVERY DAY: Zyrtec (cetirizine) 10mg  tablet once daily  and Singulair (montelukast) 10mg  at night ?- Continue with these medications AS NEEDED: Flonase (fluticasone) two sprays per nostril daily and Astelin (azelastine) 2 sprays per nostril 1-2 times daily as needed ? ?3. Intrinsic atopic dermatitis ?- Continue with triamcinolone 0.1% ointment twice daily as needed to the worst areas.  ?- Continue with moisturizing twice daily. ? ?4. Schedule a follow up appointment in 2 months or sooner if needed ? ? ? ? ? ? ? ? ? ? ?Return if symptoms worsen or fail to improve. ?  ? ?Thank you for the opportunity to care for this patient.  Please do not hesitate to contact me with  questions. ? ?Althea Charon, FNP ?[facility] ? ? ? ? ?

## 2021-08-28 ENCOUNTER — Ambulatory Visit: Payer: Medicare Other | Admitting: Podiatry

## 2021-09-10 ENCOUNTER — Ambulatory Visit (INDEPENDENT_AMBULATORY_CARE_PROVIDER_SITE_OTHER): Payer: Medicare HMO | Admitting: Physician Assistant

## 2021-09-10 ENCOUNTER — Encounter: Payer: Self-pay | Admitting: Physician Assistant

## 2021-09-10 VITALS — BP 162/100 | HR 66 | Resp 18 | Ht 69.0 in | Wt 236.0 lb

## 2021-09-10 DIAGNOSIS — F0390 Unspecified dementia without behavioral disturbance: Secondary | ICD-10-CM
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
## 2021-09-10 NOTE — Patient Instructions (Addendum)
It was a pleasure to see you today at our office.  ? ?Recommendations: ? ?Continue Memantine '10mg'$  tablets twice daily.   Side effects include dizziness, headache, diarrhea or constipation.   ?Follow up in 35month  ? ? ?RECOMMENDATIONS FOR ALL PATIENTS WITH MEMORY PROBLEMS: ?1. Continue to exercise (Recommend 30 minutes of walking everyday, or 3 hours every week) ?2. Increase social interactions - continue going to [place] enjoy social gatherings with friends and family ?3. Eat healthy, avoid fried foods and eat more fruits and vegetables ?4. Maintain adequate blood pressure, blood sugar, and blood cholesterol level. Reducing the risk of stroke and cardiovascular disease also helps promoting better memory. ?5. Avoid stressful situations. Live a simple life and avoid aggravations. Organize your time and prepare for the next day in anticipation. ?6. Sleep well, avoid any interruptions of sleep and avoid any distractions in the bedroom that may interfere with adequate sleep quality ?7. Avoid sugar, avoid sweets as there is a strong link between excessive sugar intake, diabetes, and cognitive impairment ?We discussed the Mediterranean diet, which has been shown to help patients reduce the risk of progressive memory disorders and reduces cardiovascular risk. This includes eating fish, eat fruits and green leafy vegetables, nuts like almonds and hazelnuts, walnuts, and also use olive oil. Avoid fast foods and fried foods as much as possible. Avoid sweets and sugar as sugar use has been linked to worsening of memory function. ? ?There is always a concern of gradual progression of memory problems. If this is the case, then we may need to adjust level of care according to patient needs. Support, both to the patient and caregiver, should then be put into place.  ? ? ? ?FALL PRECAUTIONS: Be cautious when walking. Scan the area for obstacles that may increase the risk of trips and falls. When getting up in the mornings, sit  up at the edge of the bed for a few minutes before getting out of bed. Consider elevating the bed at the head end to avoid drop of blood pressure when getting up. Walk always in a well-lit room (use night lights in the walls). Avoid area rugs or power cords from appliances in the middle of the walkways. Use a walker or a cane if necessary and consider physical therapy for balance exercise. Get your eyesight checked regularly. ? ?FINANCIAL OVERSIGHT: Supervision, especially oversight when making financial decisions or transactions is also recommended. ? ?HOME SAFETY: Consider the safety of the kitchen when operating appliances like stoves, microwave oven, and blender. Consider having supervision and share cooking responsibilities until no longer able to participate in those. Accidents with firearms and other hazards in the house should be identified and addressed as well. ? ? ?ABILITY TO BE LEFT ALONE: If patient is unable to contact 911 operator, consider using LifeLine, or when the need is there, arrange for someone to stay with patients. Smoking is a fire hazard, consider supervision or cessation. Risk of wandering should be assessed by caregiver and if detected at any point, supervision and safe proof recommendations should be instituted. ? ?MEDICATION SUPERVISION: Inability to self-administer medication needs to be constantly addressed. Implement a mechanism to ensure safe administration of the medications. ? ? ?DRIVING: Regarding driving, in patients with progressive memory problems, driving will be impaired. We advise to have someone else do the driving if trouble finding directions or if minor accidents are reported. Independent driving assessment is available to determine safety of driving. ? ? ?If you are interested in  the driving assessment, you can contact the following: ? ?[facility] [employer] in [place] ? ?[place] [phone] ? ?[name]  [phone] ? ?[facility] [phone] or [phone] ? ? ? ?Mediterranean Diet ?A Mediterranean diet refers to food and lifestyle choices that are based on the traditions of countries located on the [employer]. This way of eating has been shown to help prevent certain conditions and improve outcomes for people who have chronic diseases, like kidney disease and heart disease. ?What are tips for following this plan? ?Lifestyle  ?Cook and eat meals together with your family, when possible. ?Drink enough fluid to keep your urine clear or pale yellow. ?Be physically active every day. This includes: ?Aerobic exercise like running or swimming. ?Leisure activities like gardening, walking, or housework. ?Get 7-8 hours of sleep each night. ?If recommended by your health care provider, drink red wine in moderation. This means 1 glass a day for nonpregnant women and 2 glasses a day for men. A glass of wine equals 5 oz (150 mL). ?Reading food labels  ?Check the serving size of packaged foods. For foods such as rice and pasta, the serving size refers to the amount of cooked product, not dry. ?Check the total fat in packaged foods. Avoid foods that have saturated fat or trans fats. ?Check the ingredients list for added sugars, such as corn syrup. ?Shopping  ?At the grocery store, buy most of your food from the areas near the walls of the store. This includes: ?Fresh fruits and vegetables (produce). ?Grains, beans, nuts, and seeds. Some of these may be available in unpackaged forms or large amounts (in bulk). ?Fresh seafood. ?Poultry and eggs. ?Low-fat dairy products. ?Buy whole ingredients instead of prepackaged foods. ?Buy fresh fruits and vegetables in-season from local farmers markets. ?Buy frozen fruits and vegetables in resealable bags. ?If you do not have access to quality fresh seafood, buy precooked frozen shrimp or canned fish, such as tuna, salmon, or sardines. ?Buy small amounts of raw or cooked  vegetables, salads, or olives from the deli or salad bar at your store. ?Stock your pantry so you always have certain foods on hand, such as olive oil, canned tuna, canned tomatoes, rice, pasta, and beans. ?Cooking  ?Cook foods with extra-virgin olive oil instead of using butter or other vegetable oils. ?Have meat as a side dish, and have vegetables or grains as your main dish. This means having meat in small portions or adding small amounts of meat to foods like pasta or stew. ?Use beans or vegetables instead of meat in common dishes like chili or lasagna. ?Experiment with different cooking methods. Try roasting or broiling vegetables instead of steaming or saut?eing them. ?Add frozen vegetables to soups, stews, pasta, or rice. ?Add nuts or seeds for added healthy fat at each meal. You can add these to yogurt, salads, or vegetable dishes. ?Marinate fish or vegetables using olive oil, lemon juice, garlic, and fresh herbs. ?Meal planning  ?Plan to eat 1 vegetarian meal one day each week. Try to work up to 2 vegetarian meals, if possible. ?Eat seafood 2 or more times a week. ?Have healthy snacks readily available, such as: ?Vegetable sticks with hummus. ?[place] yogurt. ?Fruit and nut trail mix. ?Eat balanced meals throughout the week. This includes: ?Fruit: 2-3 servings a day ?Vegetables: 4-5 servings a day ?Low-fat dairy: 2 servings a day ?Fish, poultry, or lean meat: 1 serving a day ?Beans and legumes: 2 or more servings a week ?Nuts and  seeds: 1-2 servings a day ?Whole grains: 6-8 servings a day ?Extra-virgin olive oil: 3-4 servings a day ?Limit red meat and sweets to only a few servings a month ?What are my food choices? ?Mediterranean diet ?Recommended ?Grains: Whole-grain pasta. Brown rice. Bulgar wheat. Polenta. Couscous. Whole-wheat bread. [place]. ?Vegetables: Artichokes. Beets. Broccoli. Cabbage. Carrots. Eggplant. Green beans. Chard. Kale. Spinach. Onions. Leeks. Peas. Squash. Tomatoes. Peppers.  Radishes. ?Fruits: Apples. Apricots. Avocado. Berries. Bananas. Cherries. Dates. Figs. Grapes. Lemons. Melon. Oranges. Peaches. Plums. Pomegranate. ?Meats and other protein foods: Beans. Almonds. Sunflower seeds. Pine nuts

## 2021-09-13 ENCOUNTER — Ambulatory Visit: Payer: Medicare Other | Admitting: Podiatry

## 2021-09-18 ENCOUNTER — Encounter: Payer: Self-pay | Admitting: Podiatry

## 2021-09-18 ENCOUNTER — Ambulatory Visit: Payer: Medicare HMO | Admitting: Podiatry

## 2021-09-18 DIAGNOSIS — E1149 Type 2 diabetes mellitus with other diabetic neurological complication: Secondary | ICD-10-CM

## 2021-09-18 DIAGNOSIS — E1140 Type 2 diabetes mellitus with diabetic neuropathy, unspecified: Secondary | ICD-10-CM | POA: Diagnosis not present

## 2021-09-18 DIAGNOSIS — M2041 Other hammer toe(s) (acquired), right foot: Secondary | ICD-10-CM

## 2021-09-18 DIAGNOSIS — M79675 Pain in left toe(s): Secondary | ICD-10-CM | POA: Diagnosis not present

## 2021-09-18 DIAGNOSIS — B351 Tinea unguium: Secondary | ICD-10-CM

## 2021-09-18 DIAGNOSIS — M2141 Flat foot [pes planus] (acquired), right foot: Secondary | ICD-10-CM

## 2021-09-18 DIAGNOSIS — M2042 Other hammer toe(s) (acquired), left foot: Secondary | ICD-10-CM

## 2021-09-18 DIAGNOSIS — M2142 Flat foot [pes planus] (acquired), left foot: Secondary | ICD-10-CM

## 2021-09-18 DIAGNOSIS — M79674 Pain in right toe(s): Secondary | ICD-10-CM | POA: Diagnosis not present

## 2021-09-18 NOTE — Progress Notes (Signed)
This patient presents to the office with chief complaint of long thick painful nails.  Patient says the nails are painful walking and wearing shoes.  This patient is unable to self treat.  This patient is unable to trim his nails since she is unable to reach his nails. Patient says he is developing pain right ankle after walking.    He presents to the office for preventative foot care services. ? ?General Appearance  Alert, conversant and in no acute stress. ? ?Vascular  Dorsalis pedis and posterior tibial  pulses are palpable  bilaterally.  Capillary return is within normal limits  bilaterally. Temperature is within normal limits  bilaterally. ? ?Neurologic  Senn-Weinstein monofilament wire test within normal limits  bilaterally. Muscle power within normal limits bilaterally. ? ?Nails Thick disfigured discolored nails with subungual debris  from hallux to fifth toes bilaterally. No evidence of bacterial infection or drainage bilaterally. ? ?Orthopedic  No limitations of motion  feet .  No crepitus or effusions noted.  Hammer toes  B/L.   Pes planus. ? ?Skin  normotropic skin with no porokeratosis noted bilaterally.  No signs of infections or ulcers noted.    ? ?Onychomycosis  Nails  B/L.  Pain in right toes  Pain in left toes  peroneal tendinitis right foot ? ?Debridement of nails both feet  with nail nipper followed trimming the nails with dremel tool.     RTC 3 months. ? ? ?Gardiner Barefoot DPM  ?

## 2021-09-20 ENCOUNTER — Telehealth: Payer: Self-pay | Admitting: *Deleted

## 2021-09-20 ENCOUNTER — Other Ambulatory Visit: Payer: Self-pay

## 2021-09-20 ENCOUNTER — Ambulatory Visit (INDEPENDENT_AMBULATORY_CARE_PROVIDER_SITE_OTHER): Payer: Medicare HMO | Admitting: Allergy & Immunology

## 2021-09-20 ENCOUNTER — Encounter: Payer: Self-pay | Admitting: Allergy & Immunology

## 2021-09-20 VITALS — BP 130/86 | HR 60 | Temp 98.3°F | Resp 19 | Wt 242.2 lb

## 2021-09-20 DIAGNOSIS — K219 Gastro-esophageal reflux disease without esophagitis: Secondary | ICD-10-CM

## 2021-09-20 DIAGNOSIS — S40861A Insect bite (nonvenomous) of right upper arm, initial encounter: Secondary | ICD-10-CM

## 2021-09-20 DIAGNOSIS — J302 Other seasonal allergic rhinitis: Secondary | ICD-10-CM | POA: Diagnosis not present

## 2021-09-20 DIAGNOSIS — J3089 Other allergic rhinitis: Secondary | ICD-10-CM

## 2021-09-20 DIAGNOSIS — J8283 Eosinophilic asthma: Secondary | ICD-10-CM | POA: Diagnosis not present

## 2021-09-20 DIAGNOSIS — L2084 Intrinsic (allergic) eczema: Secondary | ICD-10-CM

## 2021-09-20 DIAGNOSIS — J4540 Moderate persistent asthma, uncomplicated: Secondary | ICD-10-CM

## 2021-09-20 DIAGNOSIS — S40862A Insect bite (nonvenomous) of left upper arm, initial encounter: Secondary | ICD-10-CM

## 2021-09-20 MED ORDER — DOXYCYCLINE MONOHYDRATE 100 MG PO TABS: 100.0000 mg | ORAL_TABLET | Freq: Two times a day (BID) | ORAL | 0 refills | Status: AC

## 2021-09-20 MED ORDER — TRIAMCINOLONE ACETONIDE 0.1 % EX CREA: TOPICAL_CREAM | Freq: Two times a day (BID) | CUTANEOUS | 5 refills | Status: DC

## 2021-09-20 MED ORDER — CETIRIZINE HCL 10 MG PO TABS: 10.0000 mg | ORAL_TABLET | Freq: Every day | ORAL | 5 refills | Status: DC

## 2021-09-20 MED ORDER — FLUTICASONE PROPIONATE 50 MCG/ACT NA SUSP
2.0000 | Freq: Every day | NASAL | 5 refills | Status: DC | PRN
Start: 2021-09-20 — End: 2022-04-03

## 2021-09-20 MED ORDER — TRELEGY ELLIPTA 200-62.5-25 MCG/ACT IN AEPB: 1.0000 | INHALATION_SPRAY | Freq: Every day | RESPIRATORY_TRACT | 5 refills | Status: DC

## 2021-09-20 MED ORDER — ALBUTEROL SULFATE HFA 108 (90 BASE) MCG/ACT IN AERS: 2.0000 | INHALATION_SPRAY | RESPIRATORY_TRACT | 1 refills | Status: DC | PRN

## 2021-09-20 NOTE — Progress Notes (Addendum)
? ?FOLLOW UP ? ?Date of Service/Encounter:  09/20/21 ? ? ?Assessment:  ? ?Moderate persistent asthma, uncomplicated - with eosinophilic phenotype (interested in starting [place] again) ?  ?Seasonal and perennial allergic rhinitis (ragweed, mixed feath, mouse, trees, weeds, grasses, indoor molds, outdoor molds, dust mites, cat, dog and cockroach) ?  ?Intrinsic atopic dermatitis ? ?Plan/Recommendations:  ? ?1. Moderate persistent asthma,uncomplicated ?- Lung testing deferred today. ?- It seems that you have a good handle on your symptoms.  ?- We are not going to make any changes at this time. ?- We will get the Fasenra back on board (Tammy will reach out to you to discuss more).  ?- Daily controller medication(s): Trelegy 29mg one puff once daily ?- Prior to physical activity: ProAir 2 puffs 10-15 minutes before physical activity. ?- Rescue medications: ProAir 4 puffs every 4-6 hours as needed ?- Asthma control goals:  ?* Full participation in all desired activities (may need albuterol before activity) ?* Albuterol use two time or less a week on average (not counting use with activity) ?* Cough interfering with sleep two time or less a month ?* Oral steroids no more than once a year ?* No hospitalizations ? ?2. Seasonal and perennial allergic rhinitis (ragweed, mixed feath, mouse, trees, weeds, grasses, indoor molds, outdoor molds, dust mites, cat, dog and cockroach)  ?- Continue with these medications EVERY DAY: Zyrtec (cetirizine) '10mg'$  tablet once daily and Singulair (montelukast) '10mg'$  at night ?- Continue with these medications AS NEEDED: Flonase (fluticasone) two sprays per nostril daily and Astelin (azelastine) 2 sprays per nostril 1-2 times daily as needed ? ?3. Intrinsic atopic dermatitis ?- Continue with triamcinolone 0.1% ointment twice daily as needed to the worst areas.  ?- Continue with moisturizing twice daily. ? ?4. Tick bite ?- I am going to send in doxycyline out of an abundance of caution. ?- Take  one tablet twice daily for 7 days.  ? ?5. Return in about 6 months (around 03/23/2022).  ? ? ?Subjective:  ? ?Nicholas Farringtonis a 71y.o. male presenting today for follow up of  ?Chief Complaint  ?Patient presents with  ? Asthma  ?  Uses it once a day   ? Allergic Rhinitis   ?  Sneezing, runny nose, and water eyes   ? Other  ?  Was bitten by a tick a week ago - no issues eating read meat   ? ? ?Nicholas Baughmanhas a history of the following: ?Patient Active Problem List  ? Diagnosis Date Noted  ? Dementia without behavioral disturbance, psychotic disturbance, mood disturbance, or anxiety ([facility] 06/13/2021  ? Pes planus 03/15/2021  ? Seasonal allergic conjunctivitis 02/18/2021  ? Gastroesophageal reflux disease 02/18/2021  ? Intrinsic atopic dermatitis 10/22/2018  ? Seasonal and perennial allergic rhinitis 07/21/2018  ? Not well controlled moderate persistent asthma 03/18/2017  ? Morbid obesity due to excess calories ([facility]  complicated by dm/ hbp, hyperlipidemia 03/18/2017  ? SOB (shortness of breath) 01/26/2017  ? Essential hypertension 09/13/2015  ? Asthma 09/30/2013  ? Poor dentition 09/30/2013  ? ? ?History obtained from: chart review and patient. ? ?Nicholas Burns a 71y.o. male presenting for a follow up visit.  He was last seen in March 2023.  At that time, he was continued with Trelegy 200 mcg 1 puff once daily as well as albuterol as needed.  For his allergic rhinitis, he was continued on Zyrtec and Singulair every day and Flonase and Astelin as needed.  Atopic dermatitis was controlled with  triamcinolone and moisturizing. ? ?Since last visit, he has sone fairly well. ? ?Asthma/Respiratory Symptom History: He reports that he is using his Trelegy. He has some SOB when he is walking up hills. He does use his rescue inhaler every 2-3 days at most.  He has not been to the hospital recently. He was on Breo prior to his last hospitalization for an asthma exacerbation.  He is interested in adding on the Fasenra back to his  regimen.  He has been on it in the past, but he was never very compliant with administration since it was only given in the office. His son is willing to give it to him Nicholas Burns) since he gives himself an injection fairly regularly.  ? ?Allergic Rhinitis Symptom History: He remains on the cetirizine and the montelukast daily. He has not needed antibiotics at all since the last visit.  He has no sprays, but does not use them on a routine basis.  He was on allergy shots remotely in the past, but was never consistent with coming. ? ?Skin Symptom History: He does have some itching  that he treats with triamcinolone ointment twice daily as needed. He is moisturizing as well. He has not needed systemic steroids or topical antibiotics for flares.  This is most notable in his bilateral arms. ? ?He was bit by a tick a a week ago. He removed it. It was not engorged. He did have some itching around it.  He has not had a fever at all.  He does not have any arthritis or fatigue either. He is not having any issues with ingestion of red meat.  ? ?Otherwise, there have been no changes to his past medical history, surgical history, family history, or social history. ? ? ? ?Review of Systems  ?Constitutional:  Positive for malaise/fatigue. Negative for chills, fever and weight loss.  ?HENT:  Positive for congestion. Negative for ear discharge, ear pain and nosebleeds.   ?     Reports clear rhinorrhea and denies nasal congestion and postnasal drip  ?Eyes:   ?     Reports watery eyes and denies itchy eyes  ?Respiratory:  Positive for cough and shortness of breath. Negative for wheezing.   ?     Reports a little bit of tightness in chest, wheezing that started today and shortness of breath when walking up a hill.  Denies coughing and nocturnal awakenings  ?Cardiovascular:  Negative for chest pain and palpitations.  ?Gastrointestinal:   ?     Reports that he had heartburn and reflux once  ?Genitourinary:   ?     Reports that he takes any  medicine for his prostate  ?Skin:  Negative for itching and rash.  ?Neurological:  Negative for headaches.  ?Endo/Heme/Allergies:  Positive for environmental allergies.   ? ? ? ?Objective:  ? ?Blood pressure 130/86, pulse 60, temperature 98.3 ?F (36.8 ?C), resp. rate 19, weight 242 lb 3.2 oz (109.9 kg), SpO2 96 %. ?Body mass index is 35.77 kg/m?. ? ? ? ?Physical Exam ?Vitals reviewed.  ?Constitutional:   ?   Appearance: He is well-developed.  ?   Comments: Flat affect.  Responsive to questions.  Seems confused at times.  ?HENT:  ?   Head: Normocephalic and atraumatic.  ?   Right Ear: Tympanic membrane, ear canal and external ear normal.  ?   Left Ear: Tympanic membrane, ear canal and external ear normal.  ?   Nose: Mucosal edema and rhinorrhea present. No nasal  deformity or septal deviation.  ?   Right Turbinates: Enlarged, swollen and pale.  ?   Left Turbinates: Enlarged, swollen and pale.  ?   Right Sinus: No maxillary sinus tenderness or frontal sinus tenderness.  ?   Left Sinus: No maxillary sinus tenderness or frontal sinus tenderness.  ?   Comments: No nasal polyps. ?   Mouth/Throat:  ?   Mouth: Mucous membranes are not pale and not dry.  ?   Pharynx: Uvula midline.  ?Eyes:  ?   General:     ?   Right eye: No discharge.     ?   Left eye: No discharge.  ?   Conjunctiva/sclera: Conjunctivae normal.  ?   Right eye: Right conjunctiva is not injected. No chemosis. ?   Left eye: Left conjunctiva is not injected. No chemosis. ?   Pupils: Pupils are equal, round, and reactive to light.  ?Cardiovascular:  ?   Rate and Rhythm: Normal rate and regular rhythm.  ?   Heart sounds: Normal heart sounds.  ?Pulmonary:  ?   Effort: Pulmonary effort is normal. No tachypnea, accessory muscle usage or respiratory distress.  ?   Breath sounds: Normal breath sounds. No wheezing, rhonchi or rales.  ?   Comments: Decreased air movement at the bases. ?Chest:  ?   Chest wall: No tenderness.  ?Lymphadenopathy:  ?   Cervical: No cervical  adenopathy.  ?Skin: ?   General: Skin is warm.  ?   Capillary Refill: Capillary refill takes less than 2 seconds.  ?   Coloration: Skin is not pale.  ?   Findings: No abrasion, erythema, petechiae or rash

## 2021-09-20 NOTE — Telephone Encounter (Signed)
L/m for patient to contact me to discuss starting Berna Bue and will need to get PAP application to patient to try and get on free drug for affordablilty ?

## 2021-09-20 NOTE — Telephone Encounter (Signed)
-----   Message from Valentina Shaggy, MD sent at 09/20/2021 12:23 PM EDT ----- ?Interested in restarting [place].  ?

## 2021-09-20 NOTE — Patient Instructions (Addendum)
1. Moderate persistent asthma,uncomplicated ?- Lung testing deferred today. ?- It seems that you have a good handle on your symptoms.  ?- We are not going to make any changes at this time. ?- We will get the Fasenra back on board (Tammy will reach out to you to discuss more).  ?- Daily controller medication(s): Trelegy 243mg one puff once daily ?- Prior to physical activity: ProAir 2 puffs 10-15 minutes before physical activity. ?- Rescue medications: ProAir 4 puffs every 4-6 hours as needed ?- Asthma control goals:  ?* Full participation in all desired activities (may need albuterol before activity) ?* Albuterol use two time or less a week on average (not counting use with activity) ?* Cough interfering with sleep two time or less a month ?* Oral steroids no more than once a year ?* No hospitalizations ? ?2. Seasonal and perennial allergic rhinitis (ragweed, mixed feath, mouse, trees, weeds, grasses, indoor molds, outdoor molds, dust mites, cat, dog and cockroach)  ?- Continue with these medications EVERY DAY: Zyrtec (cetirizine) '10mg'$  tablet once daily and Singulair (montelukast) '10mg'$  at night ?- Continue with these medications AS NEEDED: Flonase (fluticasone) two sprays per nostril daily and Astelin (azelastine) 2 sprays per nostril 1-2 times daily as needed ? ?3. Intrinsic atopic dermatitis ?- Continue with triamcinolone 0.1% ointment twice daily as needed to the worst areas.  ?- Continue with moisturizing twice daily. ? ?4. Tick bite ?- I am going to send in doxycyline out of an abundance of caution. ?- Take one tablet twice daily for 7 days.  ? ?5. Return in about 6 months (around 03/23/2022).  ? ? ?Please inform [place]of any Emergency Department visits, hospitalizations, or changes in symptoms. Call u[place]efore going to the ED for breathing or allergy symptoms since we might be able to fit you in for a sick visit. Feel free to contact u[place]anytime with any questions, problems, or concerns. ? ?It was a pleasure to see  you again today! ? ?Websites that have reliable patient information: ?1. American Academy of Asthma, Allergy, and Immunology: www.aaaai.org ?2. Food Allergy Research and Education (FARE): foodallergy.org ?3. Mothers of Asthmatics: http://www.asthmacommunitynetwork.org ?4. [employer] Corporationof Allergy, Asthma, and Immunology: wMonthlyElectricBill.co.uk? ? ?COVID-19 Vaccine Information can be found at: [email or web address].[email or web address].ukFor questions related to vaccine distribution or appointments, please email [email or web address] or call [phone]  ? ?We realize that you might be concerned about having an allergic reaction to the COVID19 vaccines. To help with that concern, WE ARE OFFERING THE COVID19 VACCINES IN OUR OFFICE! Ask the front desk for dates!  ? ? ? ??Like? u[place] Facebook and Instagram for our latest updates!  ?  ? ? ?A healthy democracy works best when ANew [employer] Life Insuranceparticipate! Make sure you are registered to vote! If you have moved or changed any of your contact information, you will need to get this updated before voting! ? ?In some cases, you MAY be able to register to vote online: [email or web address]? ? ? ? ? ? ? ? ? ?

## 2021-09-21 NOTE — Telephone Encounter (Signed)
Enrolled patient online and faxed provider form. ?

## 2021-10-08 NOTE — Telephone Encounter (Signed)
Called patient and advised approval for Fasenra 30AJ from PAP [place] and gave number for patient to order same and bring into clinic for initial dose

## 2021-10-10 NOTE — Telephone Encounter (Signed)
Thank you for taking care of that Nicholas Burns!

## 2021-12-11 ENCOUNTER — Ambulatory Visit: Payer: Medicare HMO | Admitting: Physician Assistant

## 2021-12-11 ENCOUNTER — Encounter: Payer: Self-pay | Admitting: Physician Assistant

## 2021-12-11 VITALS — BP 156/93 | HR 77 | Ht 69.0 in | Wt 240.0 lb

## 2021-12-11 DIAGNOSIS — F0390 Unspecified dementia without behavioral disturbance: Secondary | ICD-10-CM

## 2021-12-11 NOTE — Progress Notes (Signed)
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 RH male , hypertension, hyperlipidemia,  asthma/COPD, moderate depression, DM2, CKD3 seen today in follow up for memory loss. Patient is currently on memantine 10 mg twice daily, tolerating well.  His MMSE today is 27/30, with delayed recall 2/3. MRI of the brain has been reviewed, and shows mild chronic small vessel ischemic changes within the cerebral white matter and pons, and mild cerebral and cerebellar atrophy.  He is stable from the cognitive standpoint.    Recommendations:    Continue memantine 10 mg bid Side effects were discussed Monitor mood as per PCP Recommend good control of cardiovascular risk factors.  He was informed of his elevated blood pressure today, but he reports that he may have whitecoat syndrome. Follow up in 6 months.   Case discussed with Dr. Delice Lesch who agrees with the plan     Subjective:    This patient is here alone.  Previous records as well as any outside records available were reviewed prior to todays visit.Patient was last seen at our office on 09/10/21. Last MoCa on 06/12/2021 was 12/30    Any changes in memory since last visit?  Patient reports that his memory is about the same.  He continues to have some difficulties with short-term memory, long-term memory is normal.  He is able to maintain a good conversation.  During the day, he likes to read the Bible frequently.   Patient lives with: Son  repeats oneself?  Endorsed Disoriented when walking into a room?  Patient denies   Leaving objects in unusual places?  Patient denies   Ambulates  with difficulty?   Patient denies   Recent falls?  Patient denies   Any head injuries?  Patient denies   History of seizures?   Patient denies   Wandering behavior?  Patient denies   Patient drives?   Patient no longer drives  Any mood changes such irritability agitation?  Patient denies  "I am in a good mood " Any history of depression?:  Patient denies   Hallucinations?  Patient denies   Paranoia?  Patient denies   Patient reports that he sleeps well without vivid dreams, REM behavior or sleepwalking.  "I only wake up in the night to go to the bathroom ". History of sleep apnea?  Patient denies, but he does have a history of COPD and asthma, and sometimes becomes short of breath. Any hygiene concerns?  "I keep forgetting to brush my teeth ". Independent of bathing and dressing?  Endorsed  Does the patient needs help with medications?  Denies Who is in charge of the finances?   is in charge    Any changes in appetite?  Patient denies   Patient have trouble swallowing? Patient denies   Does the patient cook?  Patient denies   Any kitchen accidents such as leaving the stove on? Patient denies   Any headaches?  Patient denies   Double vision? Patient denies   Any focal numbness or tingling?  Patient denies   Chronic back pain Patient denies   Unilateral weakness?  Patient denies   Any tremors?  Patient denies   Any history of anosmia?  Patient denies   Any incontinence of urine?  Patient denies   Any bowel dysfunction?  "Sometimes I have diarrhea "   PREVIOUS MEDICATIONS:      MRI brain 06/2021 Mild chronic small vessel ischemic changes  within the cerebral white matter and pons. Mild generalized cerebral and cerebellar atrophy.   Paranasal sinus disease, as described.MEDICATIONS:  Outpatient Encounter Medications as of 12/11/2021  Medication Sig   albuterol (PROVENTIL) (2.5 MG/3ML) 0.083% nebulizer solution Take 3 mLs (2.5 mg total) by nebulization every 6 (six) hours as needed for wheezing or shortness of breath.   albuterol (VENTOLIN HFA) 108 (90 Base) MCG/ACT inhaler Inhale 2 puffs into the lungs every 4 (four) hours as needed for wheezing or shortness of breath.   amLODipine (NORVASC) 5 MG tablet Take 5 mg by mouth daily.   azelastine (ASTELIN) 0.1 % nasal spray 2  sprays per nostril 1-2 times daily as needed.   Blood Glucose Monitoring Suppl (ONETOUCH VERIO) w/Device KIT 1 each 3 (three) times daily by Does not apply route.   cetirizine (ZYRTEC) 10 MG tablet Take 1 tablet (10 mg total) by mouth daily.   EPINEPHrine 0.3 mg/0.3 mL IJ SOAJ injection SMARTSIG:1 Pre-Filled Pen Syringe IM As Directed   famotidine (PEPCID) 20 MG tablet One at bedtime   fluticasone (FLONASE) 50 MCG/ACT nasal spray Place 2 sprays into both nostrils daily as needed. Uses as needed.   glipiZIDE (GLUCOTROL) 5 MG tablet Take 5 mg by mouth daily.   glucose blood (ONETOUCH VERIO) test strip 1 each 3 (three) times daily by Other route. Use as instructed   hydrochlorothiazide (HYDRODIURIL) 25 MG tablet    irbesartan (AVAPRO) 150 MG tablet Take 1 tablet (150 mg total) by mouth daily.   MEDROL 4 MG TBPK tablet Take 4 mg by mouth as directed.   montelukast (SINGULAIR) 10 MG tablet Take 1 tablet (10 mg total) by mouth at bedtime.   omeprazole (PRILOSEC) 20 MG capsule Take 1 capsule (20 mg total) by mouth daily.   ONETOUCH DELICA LANCETS FINE MISC 1 each 3 (three) times daily by Does not apply route.   pravastatin (PRAVACHOL) 40 MG tablet Take 1 tablet (40 mg total) by mouth daily.   TRELEGY ELLIPTA 200-62.5-25 MCG/ACT AEPB Inhale 1 puff into the lungs daily.   triamcinolone cream (KENALOG) 0.1 % Apply topically 2 (two) times daily.   memantine (NAMENDA) 10 MG tablet Take 1 tablet (10 mg at night) for 2 weeks, then increase to 1 tablet (10 mg) twice a day (Patient not taking: Reported on 12/11/2021)   OVER THE COUNTER MEDICATION daily. Medication to help urine flow and frequency -? Name (Patient not taking: Reported on 12/11/2021)   Facility-Administered Encounter Medications as of 12/11/2021  Medication   Benralizumab SOSY 30 mg        No data to display            06/12/2021    1:57 PM  Montreal Cognitive Assessment   Visuospatial/ Executive (0/5) 2  Naming (0/3) 3  Attention:  Read list of digits (0/2) 1  Attention: Read list of letters (0/1) 0  Attention: Serial 7 subtraction starting at 100 (0/3) 0  Language: Repeat phrase (0/2) 0  Language : Fluency (0/1) 0  Abstraction (0/2) 0  Delayed Recall (0/5) 0  Orientation (0/6) 5  Total 11  Adjusted Score (based on education) 12    Objective:     PHYSICAL EXAMINATION:    VITALS:   Vitals:   12/11/21 1433  BP: (!) 156/93  Pulse: 77  SpO2: 95%  Weight: 240 lb (108.9 kg)  Height: '5\' 9"'  (1.753 m)    GEN:  The patient appears stated age and is in NAD. HEENT:  Normocephalic, atraumatic.   Neurological examination:  General: NAD, well-groomed, appears stated age. Orientation: The patient is alert. Oriented to person, place and date Cranial nerves: There is good facial symmetry.The speech is fluent and clear. No aphasia or dysarthria. Fund of knowledge is appropriate. Recent and remote memory are impaired. Attention and concentration are reduced.  Able to name objects and repeat phrases.  Hearing is intact to conversational tone.    Sensation: Sensation is intact to light touch throughout Motor: Strength is at least antigravity x4. Tremors: none  DTR's 2/4 in UE/LE     Movement examination: Tone: There is normal tone in the UE/LE Abnormal movements:  no tremor.  No myoclonus.  No asterixis.   Coordination:  There is no decremation with RAM's. Normal finger to nose  Gait and Station: The patient has no difficulty arising out of a deep-seated chair without the use of the hands. The patient's stride length is good.  Gait is cautious and narrow.    Thank you for allowing [place] the opportunity to participate in the care of this nice patient. Please do not hesitate to contact us for any questions or concerns.   Total time spent on today's visit was 25 minutes dedicated to this patient today, preparing to see patient, examining the patient, ordering tests and/or medications and counseling the patient,  documenting clinical information in the EHR or other health record, independently interpreting results and communicating results to the patient/family, discussing treatment and goals, answering patient's questions and coordinating care.  Cc:  Buzzy Han, MD (Inactive)  Sharene Butters 12/12/2021 7:37 AM

## 2021-12-11 NOTE — Patient Instructions (Addendum)
It was a pleasure to see you today at our office.   Recommendations:  Follow up in 6  months Continue Memantine 10 mg twice daily.Side effects were discussed  Continue to monitor the cardiovascular risk factors  Continue to monitor mood as per primary doctor    Whom to call:  Memory  decline, memory medications: Call our office [phone]   For psychiatric meds, mood meds: Please have your primary care physician manage these medications.   Counseling regarding caregiver distress, including caregiver depression, anxiety and issues regarding community resources, adult day care programs, adult living facilities, or memory care questions:   Feel free to contact [place], Social Worker at [phone]   For assessment of decision of mental capacity and competency:  Call Dr. Anthoney Harada, geriatric psychiatrist at [phone]  For guidance in geriatric dementia issues please call [facility] [phone]  For guidance regarding [facility] and if placement were needed at the facility, contact Arnell Asal, Social Worker tel: [phone]  If you have any severe symptoms of a stroke, or other severe issues such as confusion,severe chills or fever, etc call 911 or go to the ER as you may need to be evaluated further   Feel free to visit Facebook page " [email or web address]" for tips of how to care for people with memory problems.        RECOMMENDATIONS FOR ALL PATIENTS WITH MEMORY PROBLEMS: 1. Continue to exercise (Recommend 30 minutes of walking everyday, or 3 hours every week) 2. Increase social interactions - continue going to [place] and enjoy social gatherings with friends and family 3. Eat healthy, avoid fried foods and eat more fruits and vegetables 4. Maintain adequate blood pressure, blood sugar, and blood cholesterol level. Reducing the risk of stroke and cardiovascular disease also helps promoting better memory. 5. Avoid stressful  situations. Live a simple life and avoid aggravations. Organize your time and prepare for the next day in anticipation. 6. Sleep well, avoid any interruptions of sleep and avoid any distractions in the bedroom that may interfere with adequate sleep quality 7. Avoid sugar, avoid sweets as there is a strong link between excessive sugar intake, diabetes, and cognitive impairment We discussed the Mediterranean diet, which has been shown to help patients reduce the risk of progressive memory disorders and reduces cardiovascular risk. This includes eating fish, eat fruits and green leafy vegetables, nuts like almonds and hazelnuts, walnuts, and also use olive oil. Avoid fast foods and fried foods as much as possible. Avoid sweets and sugar as sugar use has been linked to worsening of memory function.  There is always a concern of gradual progression of memory problems. If this is the case, then we may need to adjust level of care according to patient needs. Support, both to the patient and caregiver, should then be put into place.    The Alzheimer's Association is here all day, every day for people facing Alzheimer's disease through our free 24/7 Helpline: [phone]. The Helpline provides reliable information and support to all those who need assistance, such as individuals living with memory loss, Alzheimer's or other dementia, caregivers, health care professionals and the public.  Our highly trained and knowledgeable staff can help you with: Understanding memory loss, dementia and Alzheimer's  Medications and other treatment options  General information about aging and brain health  Skills to provide quality care and to find the best care from professionals  Legal, financial and living-arrangement decisions Our Helpline also features: Confidential  care consultation provided by master's level clinicians who can help with decision-making support, crisis assistance and education on issues families face  every day  Help in a caller's preferred language using our translation service that features more than 200 languages and dialects  Referrals to local community programs, services and ongoing support     FALL PRECAUTIONS: Be cautious when walking. Scan the area for obstacles that may increase the risk of trips and falls. When getting up in the mornings, sit up at the edge of the bed for a few minutes before getting out of bed. Consider elevating the bed at the head end to avoid drop of blood pressure when getting up. Walk always in a well-lit room (use night lights in the walls). Avoid area rugs or power cords from appliances in the middle of the walkways. Use a walker or a cane if necessary and consider physical therapy for balance exercise. Get your eyesight checked regularly.  FINANCIAL OVERSIGHT: Supervision, especially oversight when making financial decisions or transactions is also recommended.  HOME SAFETY: Consider the safety of the kitchen when operating appliances like stoves, microwave oven, and blender. Consider having supervision and share cooking responsibilities until no longer able to participate in those. Accidents with firearms and other hazards in the house should be identified and addressed as well.   ABILITY TO BE LEFT ALONE: If patient is unable to contact 911 operator, consider using LifeLine, or when the need is there, arrange for someone to stay with patients. Smoking is a fire hazard, consider supervision or cessation. Risk of wandering should be assessed by caregiver and if detected at any point, supervision and safe proof recommendations should be instituted.  MEDICATION SUPERVISION: Inability to self-administer medication needs to be constantly addressed. Implement a mechanism to ensure safe administration of the medications.        Mediterranean Diet A Mediterranean diet refers to food and lifestyle choices that are based on the traditions of countries located on  the [employer]. This way of eating has been shown to help prevent certain conditions and improve outcomes for people who have chronic diseases, like kidney disease and heart disease. What are tips for following this plan? Lifestyle  Cook and eat meals together with your family, when possible. Drink enough fluid to keep your urine clear or pale yellow. Be physically active every day. This includes: Aerobic exercise like running or swimming. Leisure activities like gardening, walking, or housework. Get 7-8 hours of sleep each night. If recommended by your health care provider, drink red wine in moderation. This means 1 glass a day for nonpregnant women and 2 glasses a day for men. A glass of wine equals 5 oz (150 mL). Reading food labels  Check the serving size of packaged foods. For foods such as rice and pasta, the serving size refers to the amount of cooked product, not dry. Check the total fat in packaged foods. Avoid foods that have saturated fat or trans fats. Check the ingredients list for added sugars, such as corn syrup. Shopping  At the grocery store, buy most of your food from the areas near the walls of the store. This includes: Fresh fruits and vegetables (produce). Grains, beans, nuts, and seeds. Some of these may be available in unpackaged forms or large amounts (in bulk). Fresh seafood. Poultry and eggs. Low-fat dairy products. Buy whole ingredients instead of prepackaged foods. Buy fresh fruits and vegetables in-season from local farmers markets. Buy frozen fruits and vegetables in resealable bags.  If you do not have access to quality fresh seafood, buy precooked frozen shrimp or canned fish, such as tuna, salmon, or sardines. Buy small amounts of raw or cooked vegetables, salads, or olives from the deli or salad bar at your store. Stock your pantry so you always have certain foods on hand, such as olive oil, canned tuna, canned tomatoes, rice, pasta, and  beans. Cooking  Cook foods with extra-virgin olive oil instead of using butter or other vegetable oils. Have meat as a side dish, and have vegetables or grains as your main dish. This means having meat in small portions or adding small amounts of meat to foods like pasta or stew. Use beans or vegetables instead of meat in common dishes like chili or lasagna. Experiment with different cooking methods. Try roasting or broiling vegetables instead of steaming or sauteing them. Add frozen vegetables to soups, stews, pasta, or rice. Add nuts or seeds for added healthy fat at each meal. You can add these to yogurt, salads, or vegetable dishes. Marinate fish or vegetables using olive oil, lemon juice, garlic, and fresh herbs. Meal planning  Plan to eat 1 vegetarian meal one day each week. Try to work up to 2 vegetarian meals, if possible. Eat seafood 2 or more times a week. Have healthy snacks readily available, such as: Vegetable sticks with hummus. Greek yogurt. Fruit and nut trail mix. Eat balanced meals throughout the week. This includes: Fruit: 2-3 servings a day Vegetables: 4-5 servings a day Low-fat dairy: 2 servings a day Fish, poultry, or lean meat: 1 serving a day Beans and legumes: 2 or more servings a week Nuts and seeds: 1-2 servings a day Whole grains: 6-8 servings a day Extra-virgin olive oil: 3-4 servings a day Limit red meat and sweets to only a few servings a month What are my food choices? Mediterranean diet Recommended Grains: Whole-grain pasta. Brown rice. Bulgar wheat. Polenta. Couscous. Whole-wheat bread. [place]. Vegetables: Artichokes. Beets. Broccoli. Cabbage. Carrots. Eggplant. Green beans. Chard. Kale. Spinach. Onions. Leeks. Peas. Squash. Tomatoes. Peppers. Radishes. Fruits: Apples. Apricots. Avocado. Berries. Bananas. Cherries. Dates. Figs. Grapes. Lemons. Melon. Oranges. Peaches. Plums. Pomegranate. Meats and other protein foods: Beans. Almonds.  Sunflower seeds. Pine nuts. Peanuts. [place]. Salmon. Scallops. Shrimp. [place]. Tilapia. Clams. Oysters. Eggs. Dairy: Low-fat milk. Cheese. Greek yogurt. Beverages: Water. Red wine. Herbal tea. Fats and oils: Extra virgin olive oil. Avocado oil. Grape seed oil. Sweets and desserts: [place] yogurt with honey. Baked apples. Poached pears. Trail mix. Seasoning and other foods: Basil. Cilantro. Coriander. Cumin. Mint. Parsley. Sage. Rosemary. Tarragon. Garlic. Oregano. Thyme. Pepper. Balsalmic vinegar. Tahini. Hummus. Tomato sauce. Olives. Mushrooms. Limit these Grains: Prepackaged pasta or rice dishes. Prepackaged cereal with added sugar. Vegetables: Deep fried potatoes (french fries). Fruits: Fruit canned in syrup. Meats and other protein foods: Beef. Pork. Lamb. Poultry with skin. Hot dogs. Berniece Salines. Dairy: Ice cream. Sour cream. Whole milk. Beverages: Juice. Sugar-sweetened soft drinks. Beer. Liquor and spirits. Fats and oils: Butter. Canola oil. Vegetable oil. Beef fat (tallow). Lard. Sweets and desserts: Cookies. Cakes. Pies. Candy. Seasoning and other foods: Mayonnaise. Premade sauces and marinades. The items listed may not be a complete list. Talk with your dietitian about what dietary choices are right for you. Summary The Mediterranean diet includes both food and lifestyle choices. Eat a variety of fresh fruits and vegetables, beans, nuts, seeds, and whole grains. Limit the amount of red meat and sweets that you eat. Talk with your health care provider about whether it  is safe for you to drink red wine in moderation. This means 1 glass a day for nonpregnant women and 2 glasses a day for men. A glass of wine equals 5 oz (150 mL). This information is not intended to replace advice given to you by your health care provider. Make sure you discuss any questions you have with your health care provider. Document Released: 12/27/2015 Document Revised: 01/29/2016 Document Reviewed: 12/27/2015 Elsevier  Interactive Patient Education  2017 [employer].

## 2021-12-12 ENCOUNTER — Encounter: Payer: Self-pay | Admitting: Gastroenterology

## 2021-12-23 ENCOUNTER — Encounter: Payer: Self-pay | Admitting: Podiatry

## 2021-12-23 ENCOUNTER — Ambulatory Visit (INDEPENDENT_AMBULATORY_CARE_PROVIDER_SITE_OTHER): Payer: Medicare HMO | Admitting: Podiatry

## 2021-12-23 DIAGNOSIS — M79675 Pain in left toe(s): Secondary | ICD-10-CM

## 2021-12-23 DIAGNOSIS — E1140 Type 2 diabetes mellitus with diabetic neuropathy, unspecified: Secondary | ICD-10-CM

## 2021-12-23 DIAGNOSIS — M2142 Flat foot [pes planus] (acquired), left foot: Secondary | ICD-10-CM

## 2021-12-23 DIAGNOSIS — M79674 Pain in right toe(s): Secondary | ICD-10-CM | POA: Diagnosis not present

## 2021-12-23 DIAGNOSIS — B351 Tinea unguium: Secondary | ICD-10-CM

## 2021-12-23 DIAGNOSIS — E1149 Type 2 diabetes mellitus with other diabetic neurological complication: Secondary | ICD-10-CM

## 2021-12-23 DIAGNOSIS — M2141 Flat foot [pes planus] (acquired), right foot: Secondary | ICD-10-CM

## 2021-12-23 NOTE — Progress Notes (Signed)
This patient presents to the office with chief complaint of long thick painful nails.  Patient says the nails are painful walking and wearing shoes.  This patient is unable to self treat.  This patient is unable to trim his nails since she is unable to reach his nails. Patient says he is experiencing diabetic neuropathy symptoms.   He presents to the office for preventative foot care services.  General Appearance  Alert, conversant and in no acute stress.  Vascular  Dorsalis pedis and posterior tibial  pulses are palpable  bilaterally.  Capillary return is within normal limits  bilaterally. Temperature is within normal limits  bilaterally.  Neurologic  Senn-Weinstein monofilament wire test within normal limits  bilaterally. Muscle power within normal limits bilaterally.  Nails Thick disfigured discolored nails with subungual debris  from hallux to fifth toes bilaterally. No evidence of bacterial infection or drainage bilaterally.  Orthopedic  No limitations of motion  feet .  No crepitus or effusions noted.  Hammer toes  B/L.   Pes planus.  Skin  normotropic skin with no porokeratosis noted bilaterally.  No signs of infections or ulcers noted.     Onychomycosis  Nails  B/L.  Pain in right toes  Pain in left toes    Debridement of nails both feet  with nail nipper followed trimming the nails with dremel tool.     RTC 3 months.   Karder Goodin DPM  

## 2022-03-10 ENCOUNTER — Other Ambulatory Visit: Payer: Self-pay | Admitting: Allergy & Immunology

## 2022-03-10 DIAGNOSIS — J4540 Moderate persistent asthma, uncomplicated: Secondary | ICD-10-CM

## 2022-03-24 ENCOUNTER — Ambulatory Visit (INDEPENDENT_AMBULATORY_CARE_PROVIDER_SITE_OTHER): Payer: Medicare HMO | Admitting: Podiatry

## 2022-03-24 ENCOUNTER — Encounter: Payer: Self-pay | Admitting: Podiatry

## 2022-03-24 DIAGNOSIS — M2141 Flat foot [pes planus] (acquired), right foot: Secondary | ICD-10-CM | POA: Diagnosis not present

## 2022-03-24 DIAGNOSIS — E1149 Type 2 diabetes mellitus with other diabetic neurological complication: Secondary | ICD-10-CM

## 2022-03-24 DIAGNOSIS — M79675 Pain in left toe(s): Secondary | ICD-10-CM | POA: Diagnosis not present

## 2022-03-24 DIAGNOSIS — B351 Tinea unguium: Secondary | ICD-10-CM

## 2022-03-24 DIAGNOSIS — M79674 Pain in right toe(s): Secondary | ICD-10-CM

## 2022-03-24 DIAGNOSIS — E1140 Type 2 diabetes mellitus with diabetic neuropathy, unspecified: Secondary | ICD-10-CM

## 2022-03-24 DIAGNOSIS — M2142 Flat foot [pes planus] (acquired), left foot: Secondary | ICD-10-CM

## 2022-03-24 NOTE — Progress Notes (Signed)
This patient presents to the office with chief complaint of long thick painful nails.  Patient says the nails are painful walking and wearing shoes.  This patient is unable to self treat.  This patient is unable to trim his nails since she is unable to reach his nails. Patient says he is experiencing diabetic neuropathy symptoms.   He presents to the office for preventative foot care services.  General Appearance  Alert, conversant and in no acute stress.  Vascular  Dorsalis pedis and posterior tibial  pulses are palpable  bilaterally.  Capillary return is within normal limits  bilaterally. Temperature is within normal limits  bilaterally.  Neurologic  Senn-Weinstein monofilament wire test within normal limits  bilaterally. Muscle power within normal limits bilaterally.  Nails Thick disfigured discolored nails with subungual debris  from hallux to fifth toes bilaterally. No evidence of bacterial infection or drainage bilaterally.  Orthopedic  No limitations of motion  feet .  No crepitus or effusions noted.  Hammer toes  B/L.   Pes planus.  Skin  normotropic skin with no porokeratosis noted bilaterally.  No signs of infections or ulcers noted.     Onychomycosis  Nails  B/L.  Pain in right toes  Pain in left toes    Debridement of nails both feet  with nail nipper followed trimming the nails with dremel tool.     RTC 3 months.   Gardiner Barefoot DPM

## 2022-04-02 ENCOUNTER — Encounter: Payer: Self-pay | Admitting: Allergy & Immunology

## 2022-04-02 ENCOUNTER — Ambulatory Visit (INDEPENDENT_AMBULATORY_CARE_PROVIDER_SITE_OTHER): Payer: Medicare HMO | Admitting: Allergy & Immunology

## 2022-04-02 VITALS — BP 160/88 | HR 68 | Temp 97.2°F | Resp 16 | Ht 69.0 in | Wt 246.6 lb

## 2022-04-02 DIAGNOSIS — K219 Gastro-esophageal reflux disease without esophagitis: Secondary | ICD-10-CM

## 2022-04-02 DIAGNOSIS — J4540 Moderate persistent asthma, uncomplicated: Secondary | ICD-10-CM

## 2022-04-02 DIAGNOSIS — L2084 Intrinsic (allergic) eczema: Secondary | ICD-10-CM | POA: Diagnosis not present

## 2022-04-02 DIAGNOSIS — J3089 Other allergic rhinitis: Secondary | ICD-10-CM | POA: Diagnosis not present

## 2022-04-02 DIAGNOSIS — J302 Other seasonal allergic rhinitis: Secondary | ICD-10-CM

## 2022-04-02 NOTE — Progress Notes (Unsigned)
FOLLOW UP  Date of Service/Encounter:  04/02/22   Assessment:   Moderate persistent asthma, uncomplicated - with eosinophilic phenotype   Seasonal and perennial allergic rhinitis (ragweed, mixed feath, mouse, trees, weeds, grasses, indoor molds, outdoor molds, dust mites, cat, dog and cockroach)   Intrinsic atopic dermatitis  Plan/Recommendations:   1. Moderate persistent asthma, uncomplicated - Lung testing looked stable today.  - It seems that you have a good handle on your symptoms.  - Daily controller medication(s): Trelegy 294mg one puff once daily - Prior to physical activity: ProAir 2 puffs 10-15 minutes before physical activity. - Rescue medications: ProAir 4 puffs every 4-6 hours as needed - Asthma control goals:  * Full participation in all desired activities (may need albuterol before activity) * Albuterol use two time or less a week on average (not counting use with activity) * Cough interfering with sleep two time or less a month * Oral steroids no more than once a year * No hospitalizations  2. Seasonal and perennial allergic rhinitis (ragweed, mixed feath, mouse, trees, weeds, grasses, indoor molds, outdoor molds, dust mites, cat, dog and cockroach)  - Continue with these medications EVERY DAY: Zyrtec (cetirizine) '10mg'$  tablet once daily and Singulair (montelukast) '10mg'$  at night - Continue with these medications AS NEEDED: Flonase (fluticasone) two sprays per nostril daily and Astelin (azelastine) 2 sprays per nostril 1-2 times daily as needed  3. Intrinsic atopic dermatitis - Continue with triamcinolone 0.1% ointment twice daily as needed to the worst areas.  - Continue with moisturizing twice daily.  4. Return in about 6 months (around 10/01/2022).   Subjective:   PMontel Vanderhoofis a 71y.o. male presenting today for follow up of  Chief Complaint  Patient presents with   Asthma    6 mth f/u - [facility]    [name] a history of the  following: Patient Active Problem List   Diagnosis Date Noted   Dementia without behavioral disturbance, psychotic disturbance, mood disturbance, or anxiety [place] 06/13/[place]   Pes planus 03/15/2021   Seasonal allergic conjunctivitis 02/18/2021   Gastroesophageal reflux disease 02/18/2021   Intrinsic atopic dermatitis 10/22/2018   Seasonal and perennial allergic rhinitis 07/21/2018   Not well controlled moderate persistent asthma 03/18/2017   Morbid [place] due to excess calories ([place]  complicated by dm/ hbp, hyperlipidemia 03/18/2017   SOB (shortness of breath) 01/26/2017   Essential hypertension 09/13/2015   Asthma 09/30/2013   Poor dentition 09/30/2013    History obtained from: chart review and patient.  [name] a 71y.o. male presenting for a follow up visit.  He was last seen in May 2023.  At that time, we deferred his lung testing.  We attempted to get Fasenra back on board and continue with Trelegy during the micrograms 1 puff daily.  For his allergic rhinitis, we continue with Zyrtec and Singulair every day and Flonase and Astelin as needed.  Atopic dermatitis was controlled with triamcinolone and moisturizing.  He had a recent tick bite, therefore I sent in doxycycline out of an abundance of caution.  In the interim, he was approved for FBerna Buein May 2023.  It is not clear whether he started it or not. He seems confused when I asked him about it today.  Asthma/Respiratory Symptom History: He remains on the Trelegy. He was actually admitted to the hospital for less than 12 hours.  He was admitted in January 2023. This is the last one that I see in the system.  He is not on the injectalbe asthma medication. [name]'s asthma has been well controlled. He has not required rescue medication, experienced nocturnal awakenings due to lower respiratory symptoms, nor have activities of daily living been limited. He has required no Emergency Department or Urgent Care visits for his asthma. He has  required zero courses of systemic steroids for asthma exacerbations since the last visit. ACT score today is 25, indicating excellent asthma symptom control.  Allergic Rhinitis Symptom History: He remains on the nose sprays.  He also has the Zyrtec and the Singulair.  He has not needed antibiotics at all since last visit.  Skin Symptom History: Skin is still itching a bit. The worst area is on the back of the leg.  However, overall he is under good control.  Otherwise, there have been no changes to his past medical history, surgical history, family history, or social history.    Review of Systems  Constitutional:  Negative for chills, fever, malaise/fatigue and weight loss.  HENT:  Positive for congestion. Negative for ear discharge, ear pain and nosebleeds.        Reports clear rhinorrhea and denies nasal congestion and postnasal drip  Eyes:        Reports watery eyes and denies itchy eyes  Respiratory:  Positive for cough. Negative for shortness of breath and wheezing.        Reports a little bit of tightness in chest, wheezing that started today and shortness of breath when walking up a hill.  Denies coughing and nocturnal awakenings  Cardiovascular:  Negative for chest pain and palpitations.  Gastrointestinal:        Reports that he had heartburn and reflux once  Genitourinary:        Reports that he takes any medicine for his prostate  Skin:  Negative for itching and rash.  Neurological:  Negative for headaches.  Endo/Heme/Allergies:  Positive for environmental allergies.  All other systems reviewed and are negative.      Objective:   Blood pressure (!) 160/88, pulse 68, temperature (!) 97.2 F (36.2 C), resp. rate 16, height '5\' 9"'$  (1.753 m), weight 246 lb 9.6 oz (111.9 kg), SpO2 94 %. Body mass index is 36.42 kg/m.    Physical Exam Vitals reviewed.  Constitutional:      Appearance: He is well-developed.     Comments: Responsive to questions.  Seems confused at times.   HENT:     Head: Normocephalic and atraumatic.     Right Ear: Tympanic membrane, ear canal and external ear normal.     Left Ear: Tympanic membrane, ear canal and external ear normal.     Nose: Mucosal edema and rhinorrhea present. No nasal deformity or septal deviation.     Right Turbinates: Enlarged, swollen and pale.     Left Turbinates: Enlarged, swollen and pale.     Right Sinus: No maxillary sinus tenderness or frontal sinus tenderness.     Left Sinus: No maxillary sinus tenderness or frontal sinus tenderness.     Comments: No nasal polyps.    Mouth/Throat:     Mouth: Mucous membranes are not pale and not dry.     Pharynx: Uvula midline.  Eyes:     General:        Right eye: No discharge.        Left eye: No discharge.     Conjunctiva/sclera: Conjunctivae normal.     Right eye: Right conjunctiva is not injected. No chemosis.    Left  eye: Left conjunctiva is not injected. No chemosis.    Pupils: Pupils are equal, round, and reactive to light.  Cardiovascular:     Rate and Rhythm: Normal rate and regular rhythm.     Heart sounds: Normal heart sounds.  Pulmonary:     Effort: Pulmonary effort is normal. No tachypnea, accessory muscle usage or respiratory distress.     Breath sounds: Normal breath sounds. No wheezing, rhonchi or rales.     Comments: Decreased air movement at the bases. Chest:     Chest wall: No tenderness.  Lymphadenopathy:     Cervical: No cervical adenopathy.  Skin:    General: Skin is warm.     Capillary Refill: Capillary refill takes less than 2 seconds.     Coloration: Skin is not pale.     Findings: No abrasion, erythema, petechiae or rash. Rash is not papular, urticarial or vesicular.     Comments: No eczematous or urticarial lesions noted.   Neurological:     Mental Status: He is alert.  Psychiatric:        Behavior: Behavior is cooperative.      Diagnostic studies:    Spirometry: results normal (FEV1: 1.87/74%, FVC: 2.31/74%, FEV1/FVC: 81%).     Spirometry consistent with normal pattern.   Allergy Studies: none        Salvatore Marvel, MD  [facility] [place] [facility] [place]

## 2022-04-02 NOTE — Patient Instructions (Addendum)
1. Moderate persistent asthma, uncomplicated - Lung testing looked stable today.  - It seems that you have a good handle on your symptoms.  - Daily controller medication(s): Trelegy 292mg one puff once daily - Prior to physical activity: ProAir 2 puffs 10-15 minutes before physical activity. - Rescue medications: ProAir 4 puffs every 4-6 hours as needed - Asthma control goals:  * Full participation in all desired activities (may need albuterol before activity) * Albuterol use two time or less a week on average (not counting use with activity) * Cough interfering with sleep two time or less a month * Oral steroids no more than once a year * No hospitalizations  2. Seasonal and perennial allergic rhinitis (ragweed, mixed feath, mouse, trees, weeds, grasses, indoor molds, outdoor molds, dust mites, cat, dog and cockroach)  - Continue with these medications EVERY DAY: Zyrtec (cetirizine) '10mg'$  tablet once daily and Singulair (montelukast) '10mg'$  at night - Continue with these medications AS NEEDED: Flonase (fluticasone) two sprays per nostril daily and Astelin (azelastine) 2 sprays per nostril 1-2 times daily as needed  3. Intrinsic atopic dermatitis - Continue with triamcinolone 0.1% ointment twice daily as needed to the worst areas.  - Continue with moisturizing twice daily.  4. Return in about 6 months (around 10/01/2022).    Please inform [place] any Emergency Department visits, hospitalizations, or changes in symptoms. Call u[place]efore going to the ED for breathing or allergy symptoms since we might be able to fit you in for a sick visit. Feel free to contact u[place]anytime with any questions, problems, or concerns.  It was a pleasure to see you again today!  Websites that have reliable patient information: 1. American Academy of Asthma, Allergy, and Immunology: www.aaaai.org 2. Food Allergy Research and Education (FARE): foodallergy.org 3. Mothers of Asthmatics:  http://www.asthmacommunitynetwork.org 4. American College of Allergy, Asthma, and Immunology: www.acaai.org   COVID-19 Vaccine Information can be found at: [email or web address] questions related to vaccine distribution or appointments, please email [email or web address] or call [phone]   We realize that you might be concerned about having an allergic reaction to the COVID19 vaccines. To help with that concern, WE ARE OFFERING THE COVID19 VACCINES IN OUR OFFICE! Ask the front desk for dates!     "Like" u[place] Facebook and Instagram for our latest updates!      A healthy democracy works best when ANew [employer] Life Insuranceparticipate! Make sure you are registered to vote! If you have moved or changed any of your contact information, you will need to get this updated before voting!  In some cases, you MAY be able to register to vote online: [email or web address]

## 2022-04-03 ENCOUNTER — Encounter: Payer: Self-pay | Admitting: Allergy & Immunology

## 2022-04-03 MED ORDER — CETIRIZINE HCL 10 MG PO TABS: 10.0000 mg | ORAL_TABLET | Freq: Every day | ORAL | 5 refills | Status: AC

## 2022-04-03 MED ORDER — MONTELUKAST SODIUM 10 MG PO TABS: 10.0000 mg | ORAL_TABLET | Freq: Every day | ORAL | 5 refills | Status: DC

## 2022-04-03 MED ORDER — FLUTICASONE PROPIONATE 50 MCG/ACT NA SUSP: 2.0000 | Freq: Every day | NASAL | 5 refills | Status: AC | PRN

## 2022-04-03 MED ORDER — TRELEGY ELLIPTA 200-62.5-25 MCG/ACT IN AEPB: 1.0000 | INHALATION_SPRAY | Freq: Every day | RESPIRATORY_TRACT | 5 refills | Status: DC

## 2022-04-03 MED ORDER — AZELASTINE HCL 0.1 % NA SOLN: NASAL | 5 refills | Status: AC

## 2022-04-07 ENCOUNTER — Telehealth: Payer: Self-pay | Admitting: *Deleted

## 2022-04-07 NOTE — Telephone Encounter (Signed)
-----   Message from Valentina Shaggy, MD sent at 04/03/2022  7:41 AM EST ----- He did not seem to know what Berna Bue was today.

## 2022-04-07 NOTE — Telephone Encounter (Signed)
L/m for patient to contact me discuss his Fasenr autoinjector he was approved by [employer] for. He can order and have sent to him as we had discussed previously or I can order same

## 2022-04-16 NOTE — Telephone Encounter (Signed)
L/m for patient to contact me  

## 2022-04-23 NOTE — Telephone Encounter (Signed)
Patient never returned calls 

## 2022-04-24 NOTE — Telephone Encounter (Signed)
We are not doing this anymore. Lordy.   Salvatore Marvel, MD [facility] [place] [facility] [place]

## 2022-06-13 ENCOUNTER — Ambulatory Visit: Payer: Medicare HMO | Admitting: Physician Assistant

## 2022-06-24 ENCOUNTER — Encounter: Payer: Self-pay | Admitting: Physician Assistant

## 2022-06-24 ENCOUNTER — Encounter: Payer: Self-pay | Admitting: Podiatry

## 2022-06-24 ENCOUNTER — Ambulatory Visit: Payer: Medicare HMO | Admitting: Physician Assistant

## 2022-06-24 ENCOUNTER — Ambulatory Visit: Payer: Medicare HMO | Admitting: Podiatry

## 2022-06-24 VITALS — BP 140/88 | HR 71 | Resp 18 | Ht 69.0 in | Wt 246.0 lb

## 2022-06-24 VITALS — BP 139/80

## 2022-06-24 DIAGNOSIS — E1140 Type 2 diabetes mellitus with diabetic neuropathy, unspecified: Secondary | ICD-10-CM

## 2022-06-24 DIAGNOSIS — B351 Tinea unguium: Secondary | ICD-10-CM | POA: Diagnosis not present

## 2022-06-24 DIAGNOSIS — E1149 Type 2 diabetes mellitus with other diabetic neurological complication: Secondary | ICD-10-CM | POA: Diagnosis not present

## 2022-06-24 DIAGNOSIS — M79675 Pain in left toe(s): Secondary | ICD-10-CM

## 2022-06-24 DIAGNOSIS — M79674 Pain in right toe(s): Secondary | ICD-10-CM

## 2022-06-24 DIAGNOSIS — F0390 Unspecified dementia without behavioral disturbance: Secondary | ICD-10-CM | POA: Diagnosis not present

## 2022-06-24 NOTE — Progress Notes (Signed)
This patient presents to the office with chief complaint of long thick painful nails.  Patient says the nails are painful walking and wearing shoes.  This patient is unable to self treat.  This patient is unable to trim his nails since she is unable to reach his nails. Patient says he is experiencing diabetic neuropathy symptoms.   He presents to the office for preventative foot care services.  General Appearance  Alert, conversant and in no acute stress.  Vascular  Dorsalis pedis and posterior tibial  pulses are palpable  bilaterally.  Capillary return is within normal limits  bilaterally. Temperature is within normal limits  bilaterally.  Neurologic  Senn-Weinstein monofilament wire test within normal limits  bilaterally. Muscle power within normal limits bilaterally.  Nails Thick disfigured discolored nails with subungual debris  from hallux to fifth toes bilaterally. No evidence of bacterial infection or drainage bilaterally.  Orthopedic  No limitations of motion  feet .  No crepitus or effusions noted.  Hammer toes  B/L.   Pes planus.  Skin  normotropic skin with no porokeratosis noted bilaterally.  No signs of infections or ulcers noted.     Onychomycosis  Nails  B/L.  Pain in right toes  Pain in left toes    Debridement of nails both feet  with nail nipper followed trimming the nails with dremel tool.     RTC 3 months.   Gardiner Barefoot DPM

## 2022-06-24 NOTE — Patient Instructions (Signed)
It was a pleasure to see you today at our office.   Recommendations:  Follow up in 6  months Continue Memantine 10 mg twice daily.  Continue to monitor the cardiovascular risk factors  Continue to monitor mood as per primary doctor    Whom to call:  Memory  decline, memory medications: Call our office [phone]   For psychiatric meds, mood meds: Please have your primary care physician manage these medications.   Counseling regarding caregiver distress, including caregiver depression, anxiety and issues regarding community resources, adult day care programs, adult living facilities, or memory care questions:   Feel free to contact [place], Social Worker at [phone]   For assessment of decision of mental capacity and competency:  Call Dr. Anthoney Harada, geriatric psychiatrist at [phone]  For guidance in geriatric dementia issues please call [facility] [phone]  For guidance regarding [facility] and if placement were needed at the facility, contact Arnell Asal, Social Worker tel: [phone]  If you have any severe symptoms of a stroke, or other severe issues such as confusion,severe chills or fever, etc call 911 or go to the ER as you may need to be evaluated further   Feel free to visit Facebook page " [email or web address]" for tips of how to care for people with memory problems.        RECOMMENDATIONS FOR ALL PATIENTS WITH MEMORY PROBLEMS: 1. Continue to exercise (Recommend 30 minutes of walking everyday, or 3 hours every week) 2. Increase social interactions - continue going to [place] and enjoy social gatherings with friends and family 3. Eat healthy, avoid fried foods and eat more fruits and vegetables 4. Maintain adequate blood pressure, blood sugar, and blood cholesterol level. Reducing the risk of stroke and cardiovascular disease also helps promoting better memory. 5. Avoid stressful situations. Live a simple life and  avoid aggravations. Organize your time and prepare for the next day in anticipation. 6. Sleep well, avoid any interruptions of sleep and avoid any distractions in the bedroom that may interfere with adequate sleep quality 7. Avoid sugar, avoid sweets as there is a strong link between excessive sugar intake, diabetes, and cognitive impairment We discussed the Mediterranean diet, which has been shown to help patients reduce the risk of progressive memory disorders and reduces cardiovascular risk. This includes eating fish, eat fruits and green leafy vegetables, nuts like almonds and hazelnuts, walnuts, and also use olive oil. Avoid fast foods and fried foods as much as possible. Avoid sweets and sugar as sugar use has been linked to worsening of memory function.  There is always a concern of gradual progression of memory problems. If this is the case, then we may need to adjust level of care according to patient needs. Support, both to the patient and caregiver, should then be put into place.    The Alzheimer's Association is here all day, every day for people facing Alzheimer's disease through our free 24/7 Helpline: 579-132-4420. The Helpline provides reliable information and support to all those who need assistance, such as individuals living with memory loss, Alzheimer's or other dementia, caregivers, health care professionals and the public.  Our highly trained and knowledgeable staff can help you with: Understanding memory loss, dementia and Alzheimer's  Medications and other treatment options  General information about aging and brain health  Skills to provide quality care and to find the best care from professionals  Legal, financial and living-arrangement decisions Our Helpline also features: Confidential care consultation provided  by master's level clinicians who can help with decision-making support, crisis assistance and education on issues families face every day  Help in a caller's  preferred language using our translation service that features more than 200 languages and dialects  Referrals to local community programs, services and ongoing support     FALL PRECAUTIONS: Be cautious when walking. Scan the area for obstacles that may increase the risk of trips and falls. When getting up in the mornings, sit up at the edge of the bed for a few minutes before getting out of bed. Consider elevating the bed at the head end to avoid drop of blood pressure when getting up. Walk always in a well-lit room (use night lights in the walls). Avoid area rugs or power cords from appliances in the middle of the walkways. Use a walker or a cane if necessary and consider physical therapy for balance exercise. Get your eyesight checked regularly.  FINANCIAL OVERSIGHT: Supervision, especially oversight when making financial decisions or transactions is also recommended.  HOME SAFETY: Consider the safety of the kitchen when operating appliances like stoves, microwave oven, and blender. Consider having supervision and share cooking responsibilities until no longer able to participate in those. Accidents with firearms and other hazards in the house should be identified and addressed as well.   ABILITY TO BE LEFT ALONE: If patient is unable to contact 911 operator, consider using LifeLine, or when the need is there, arrange for someone to stay with patients. Smoking is a fire hazard, consider supervision or cessation. Risk of wandering should be assessed by caregiver and if detected at any point, supervision and safe proof recommendations should be instituted.  MEDICATION SUPERVISION: Inability to self-administer medication needs to be constantly addressed. Implement a mechanism to ensure safe administration of the medications.        Mediterranean Diet A Mediterranean diet refers to food and lifestyle choices that are based on the traditions of countries located on the [employer]. This  way of eating has been shown to help prevent certain conditions and improve outcomes for people who have chronic diseases, like kidney disease and heart disease. What are tips for following this plan? Lifestyle  Cook and eat meals together with your family, when possible. Drink enough fluid to keep your urine clear or pale yellow. Be physically active every day. This includes: Aerobic exercise like running or swimming. Leisure activities like gardening, walking, or housework. Get 7-8 hours of sleep each night. If recommended by your health care provider, drink red wine in moderation. This means 1 glass a day for nonpregnant women and 2 glasses a day for men. A glass of wine equals 5 oz (150 mL). Reading food labels  Check the serving size of packaged foods. For foods such as rice and pasta, the serving size refers to the amount of cooked product, not dry. Check the total fat in packaged foods. Avoid foods that have saturated fat or trans fats. Check the ingredients list for added sugars, such as corn syrup. Shopping  At the grocery store, buy most of your food from the areas near the walls of the store. This includes: Fresh fruits and vegetables (produce). Grains, beans, nuts, and seeds. Some of these may be available in unpackaged forms or large amounts (in bulk). Fresh seafood. Poultry and eggs. Low-fat dairy products. Buy whole ingredients instead of prepackaged foods. Buy fresh fruits and vegetables in-season from local farmers markets. Buy frozen fruits and vegetables in resealable bags. If you do  not have access to quality fresh seafood, buy precooked frozen shrimp or canned fish, such as tuna, salmon, or sardines. Buy small amounts of raw or cooked vegetables, salads, or olives from the deli or salad bar at your store. Stock your pantry so you always have certain foods on hand, such as olive oil, canned tuna, canned tomatoes, rice, pasta, and beans. Cooking  Cook foods with  extra-virgin olive oil instead of using butter or other vegetable oils. Have meat as a side dish, and have vegetables or grains as your main dish. This means having meat in small portions or adding small amounts of meat to foods like pasta or stew. Use beans or vegetables instead of meat in common dishes like chili or lasagna. Experiment with different cooking methods. Try roasting or broiling vegetables instead of steaming or sauteing them. Add frozen vegetables to soups, stews, pasta, or rice. Add nuts or seeds for added healthy fat at each meal. You can add these to yogurt, salads, or vegetable dishes. Marinate fish or vegetables using olive oil, lemon juice, garlic, and fresh herbs. Meal planning  Plan to eat 1 vegetarian meal one day each week. Try to work up to 2 vegetarian meals, if possible. Eat seafood 2 or more times a week. Have healthy snacks readily available, such as: Vegetable sticks with hummus. Greek yogurt. Fruit and nut trail mix. Eat balanced meals throughout the week. This includes: Fruit: 2-3 servings a day Vegetables: 4-5 servings a day Low-fat dairy: 2 servings a day Fish, poultry, or lean meat: 1 serving a day Beans and legumes: 2 or more servings a week Nuts and seeds: 1-2 servings a day Whole grains: 6-8 servings a day Extra-virgin olive oil: 3-4 servings a day Limit red meat and sweets to only a few servings a month What are my food choices? Mediterranean diet Recommended Grains: Whole-grain pasta. Brown rice. Bulgar wheat. Polenta. Couscous. Whole-wheat bread. [place]. Vegetables: Artichokes. Beets. Broccoli. Cabbage. Carrots. Eggplant. Green beans. Chard. Kale. Spinach. Onions. Leeks. Peas. Squash. Tomatoes. Peppers. Radishes. Fruits: Apples. Apricots. Avocado. Berries. Bananas. Cherries. Dates. Figs. Grapes. Lemons. Melon. Oranges. Peaches. Plums. Pomegranate. Meats and other protein foods: Beans. Almonds. Sunflower seeds. Pine nuts. Peanuts. [place].  Salmon. Scallops. Shrimp. [place]. Tilapia. Clams. Oysters. Eggs. Dairy: Low-fat milk. Cheese. Greek yogurt. Beverages: Water. Red wine. Herbal tea. Fats and oils: Extra virgin olive oil. Avocado oil. Grape seed oil. Sweets and desserts: [place] yogurt with honey. Baked apples. Poached pears. Trail mix. Seasoning and other foods: Basil. Cilantro. Coriander. Cumin. Mint. Parsley. Sage. Rosemary. Tarragon. Garlic. Oregano. Thyme. Pepper. Balsalmic vinegar. Tahini. Hummus. Tomato sauce. Olives. Mushrooms. Limit these Grains: Prepackaged pasta or rice dishes. Prepackaged cereal with added sugar. Vegetables: Deep fried potatoes (french fries). Fruits: Fruit canned in syrup. Meats and other protein foods: Beef. Pork. Lamb. Poultry with skin. Hot dogs. Bernie[place] Salines. Dairy: Ice cream. Sour cream. Whole milk. Beverages: Juice. Sugar-sweetened soft drinks. Beer. Liquor and spirits. Fats and oils: Butter. Canola oil. Vegetable oil. Beef fat (tallow). Lard. Sweets and desserts: Cookies. Cakes. Pies. Candy. Seasoning and other foods: Mayonnaise. Premade sauces and marinades. The items listed may not be a complete list. Talk with your dietitian about what dietary choices are right for you. Summary The Mediterranean diet includes both food and lifestyle choices. Eat a variety of fresh fruits and vegetables, beans, nuts, seeds, and whole grains. Limit the amount of red meat and sweets that you eat. Talk with your health care provider about whether it is safe for  you to drink red wine in moderation. This means 1 glass a day for nonpregnant women and 2 glasses a day for men. A glass of wine equals 5 oz (150 mL). This information is not intended to replace advice given to you by your health care provider. Make sure you discuss any questions you have with your health care provider. Document Released: 12/27/2015 Document Revised: 01/29/2016 Document Reviewed: 12/27/2015 Elsevier Interactive Patient Education  2017 [employer].

## 2022-06-24 NOTE — Progress Notes (Signed)
Assessment/Plan:   Memory Impairment Nicholas Burns is a very pleasant 72 y.o. RH male with a history of hypertension, hyperlipidemia,  asthma/COPD, moderate depression, DM2, CKD3 seen today in follow up for memory loss. Patient is currently on memantine 10 mg twice daily, tolerating well.  Prior MRI brain personally reviewed was remarkable for mild chronic small vessel ischemic changes within the cerebral white matter and pons and mild generalized cerebral and cerebellar atrophy.       Follow up in  6 months. Continue memantine 10 mg twice daily, side effects discussed Continue to control mood as per PCP Recommend good control of cardiovascular risk factors     Subjective:    This patient is here alone***.  Previous records as well as any outside records available were reviewed prior to todays visit. Patient was last seen on 06/12/2021, at which time his MoCA was 12/30.***   Any changes in memory since last visit? Pretty  good, no changes since the last time repeats oneself?  Endorsed Disoriented when walking into a room?  Patient denies   Leaving objects in unusual places?    denies   Wandering behavior?  denies   Any personality changes since last visit?  denies   Any worsening depression?:  denies   Hallucinations or paranoia?  denies   Seizures?    denies    Any sleep changes?  Denies vivid dreams, REM behavior or sleepwalking   Sleep apnea?   denies   Any hygiene concerns?  "I keep forgetting to brush my teeth " "Shower once a week" Independent of bathing and dressing?  Endorsed  Does the patient needs help with medications? patient is in charge, sometimes he forgets doses    Who is in charge of the finances? Patient  is in charge     Any changes in appetite?  denies   I try to lose weight.   Patient have trouble swallowing?  denies   Does the patient cook? Yes   Any kitchen accidents such as leaving the stove on? Patient denies   Any headaches?   denies   Chronic  back pain  denies   Ambulates with difficulty?  denies   Recent falls or head injuries? denies     Unilateral weakness, numbness or tingling?    denies   Any tremors?  denies   Any anosmia?  Patient denies   Any incontinence of urine?  Endorsed, nocturia 2-4 times at night Any bowel dysfunction?  "Sometimes I have diarrhea "    Patient lives  with his son Does the patient drive?No longer drives   MRI of the brain personally reviewed February 2023 remarkable for mild chronic small vessel ischemic changes within the cerebral white matter and pons, and mild cerebral and cerebellar atrophy.  PREVIOUS MEDICATIONS:   CURRENT MEDICATIONS:  Outpatient Encounter Medications as of 06/24/2022  Medication Sig   albuterol (PROVENTIL) (2.5 MG/3ML) 0.083% nebulizer solution Take 3 mLs (2.5 mg total) by nebulization every 6 (six) hours as needed for wheezing or shortness of breath.   albuterol (VENTOLIN HFA) 108 (90 Base) MCG/ACT inhaler INHALE 2 PUFFS EVERY 4 HOURS AS NEEDED   amLODipine (NORVASC) 5 MG tablet Take 5 mg by mouth daily.   azelastine (ASTELIN) 0.1 % nasal spray 2 sprays per nostril 1-2 times daily as needed.   Blood Glucose Monitoring Suppl (ONETOUCH VERIO) w/Device KIT 1 each 3 (three) times daily by Does not apply route.   cetirizine (ZYRTEC) 10 MG tablet  Take 1 tablet (10 mg total) by mouth daily.   EPINEPHrine 0.3 mg/0.3 mL IJ SOAJ injection SMARTSIG:1 Pre-Filled Pen Syringe IM As Directed   famotidine (PEPCID) 20 MG tablet One at bedtime   fluticasone (FLONASE) 50 MCG/ACT nasal spray Place 2 sprays into both nostrils daily as needed. Uses as needed.   glipiZIDE (GLUCOTROL) 5 MG tablet Take 5 mg by mouth daily.   glucose blood (ONETOUCH VERIO) test strip 1 each 3 (three) times daily by Other route. Use as instructed   hydrochlorothiazide (HYDRODIURIL) 25 MG tablet    irbesartan (AVAPRO) 150 MG tablet Take 1 tablet (150 mg total) by mouth daily.   MEDROL 4 MG TBPK tablet Take 4 mg by  mouth as directed.   memantine (NAMENDA) 10 MG tablet Take 1 tablet (10 mg at night) for 2 weeks, then increase to 1 tablet (10 mg) twice a day   metFORMIN (GLUCOPHAGE) 1000 MG tablet Take 1,000 mg by mouth 2 (two) times daily.   montelukast (SINGULAIR) 10 MG tablet Take 1 tablet (10 mg total) by mouth at bedtime.   omeprazole (PRILOSEC) 20 MG capsule Take 1 capsule (20 mg total) by mouth daily.   ONETOUCH DELICA LANCETS FINE MISC 1 each 3 (three) times daily by Does not apply route.   OVER THE COUNTER MEDICATION daily. Medication to help urine flow and frequency -? Name   pravastatin (PRAVACHOL) 40 MG tablet Take 1 tablet (40 mg total) by mouth daily.   SPIRIVA HANDIHALER 18 MCG inhalation capsule 1 capsule daily.   TRELEGY ELLIPTA 200-62.5-25 MCG/ACT AEPB Inhale 1 puff into the lungs daily.   triamcinolone cream (KENALOG) 0.1 % Apply topically 2 (two) times daily.   Facility-Administered Encounter Medications as of 06/24/2022  Medication   Benralizumab SOSY 30 mg        No data to display            06/12/2021    1:57 PM  Montreal Cognitive Assessment   Visuospatial/ Executive (0/5) 2  Naming (0/3) 3  Attention: Read list of digits (0/2) 1  Attention: Read list of letters (0/1) 0  Attention: Serial 7 subtraction starting at 100 (0/3) 0  Language: Repeat phrase (0/2) 0  Language : Fluency (0/1) 0  Abstraction (0/2) 0  Delayed Recall (0/5) 0  Orientation (0/6) 5  Total 11  Adjusted Score (based on education) 12    Objective:     PHYSICAL EXAMINATION:    VITALS:  There were no vitals filed for this visit.  GEN:  The patient appears stated age and is in NAD. HEENT:  Normocephalic, atraumatic.   Neurological examination:  General: NAD, well-groomed, appears stated age. Orientation: The patient is alert. Oriented to person, place and date Cranial nerves: There is good facial symmetry.The speech is fluent and clear. No aphasia or dysarthria. Fund of knowledge is  appropriate. Recent and remote memory are impaired. Attention and concentration are reduced.  Able to name objects and repeat phrases.  Hearing is intact to conversational tone.    Sensation: Sensation is intact to light touch throughout Motor: Strength is at least antigravity x4. DTR's 2/4 in UE/LE     Movement examination: Tone: There is normal tone in the UE/LE Abnormal movements:  no tremor.  No myoclonus.  No asterixis.   Coordination:  There is no decremation with RAM's. Normal finger to nose  Gait and Station: The patient has no difficulty arising out of a deep-seated chair without the use of the  hands. The patient's stride length is good.  Gait is cautious and narrow.    Thank you for allowing [place] the opportunity to participate in the care of this nice patient. Please do not hesitate to contact us for any questions or concerns.   Total time spent on today's visit was *** minutes dedicated to this patient today, preparing to see patient, examining the patient, ordering tests and/or medications and counseling the patient, documenting clinical information in the EHR or other health record, independently interpreting results and communicating results to the patient/family, discussing treatment and goals, answering patient's questions and coordinating care.  Cc:  Buzzy Han, MD (Inactive)  Sharene Butters 06/24/2022 6:59 AM

## 2022-08-13 ENCOUNTER — Encounter: Payer: Self-pay | Admitting: Gastroenterology

## 2022-08-28 ENCOUNTER — Ambulatory Visit (AMBULATORY_SURGERY_CENTER): Payer: Medicare HMO | Admitting: *Deleted

## 2022-08-28 ENCOUNTER — Encounter: Payer: Self-pay | Admitting: Gastroenterology

## 2022-08-28 VITALS — Ht 69.0 in | Wt 240.0 lb

## 2022-08-28 DIAGNOSIS — Z83719 Family history of colon polyps, unspecified: Secondary | ICD-10-CM

## 2022-08-28 MED ORDER — NA SULFATE-K SULFATE-MG SULF 17.5-3.13-1.6 GM/177ML PO SOLN: 1.0000 | Freq: Once | ORAL | 0 refills | Status: AC

## 2022-08-28 NOTE — Progress Notes (Signed)
Pt's pre-visit is done over the phone and all paperwork (prep instructions) sent to patient. Pt's name and DOB verified at the beginning of the pre-visit. Pt denies any difficulty with ambulating.  No egg or soy allergy known to patient  No issues known to pt with past sedation with any surgeries or procedures Pt denies having been intubated Pt has no issues moving head neck or swallowing No FH of Malignant Hyperthermia Pt is not on diet pills Pt is not on home 02  Pt is not on blood thinners  Pt denies issues with constipation. Instructed to use Miralax per bottle instructions for the week before procedure. Pt is not on dialysis Pt denies any upcoming cardiac testing Seeing PCP 2 weeks from now for labs will call MD office Pernicious  Anemia Pt encouraged to use to use Singlecare or Goodrx to reduce cost  Patient's chart reviewed by Cathlyn Parsons CNRA prior to pre-visit and patient appropriate for the [facility].  Pre-visit completed and red dot placed by patient's name on their procedure day (on provider's schedule).  . Visit by phone Pt states weight is 240lb Instructions reviewed with pt and pt states understanding. Instructed to review again prior to procedure. Pt states they will.  Instructions sent by mail with coupon

## 2022-09-23 ENCOUNTER — Ambulatory Visit: Payer: Medicare HMO | Admitting: Podiatry

## 2022-09-26 ENCOUNTER — Encounter: Payer: Self-pay | Admitting: Gastroenterology

## 2022-09-30 ENCOUNTER — Encounter: Payer: Medicare HMO | Admitting: Gastroenterology

## 2022-10-01 ENCOUNTER — Ambulatory Visit: Payer: Medicare HMO | Admitting: Family

## 2022-10-07 NOTE — Progress Notes (Deleted)
   [address] [place] [address] Dept: [phone]  FOLLOW UP NOTE  Patient ID: Nicholas Burns, male    DOB: 03/08/1951  Age: 72 y.o. MRN: [id number] Date of Office Visit: 10/08/2022  Assessment  Chief Complaint: No chief complaint on file.  HPI Nicholas Burns  my mom is in is a 72 year old male who presents to the clinic for follow-up visit.  He was last seen in this clinic on 04/02/2022 by Dr. Dellis Anes for evaluation of asthma, allergic rhinitis, and atopic dermatitis.  His last environmental allergy testing was positive to grass pollen, weed pollen, tree pollen, mold, dust mite, cat, dog, and cockroach.   Drug Allergies:  No Known Allergies  Physical Exam: There were no vitals taken for this visit.   Physical Exam  Diagnostics:    Assessment and Plan: No diagnosis found.  No orders of the defined types were placed in this encounter.   There are no Patient Instructions on file for this visit.  No follow-ups on file.    Thank you for the opportunity to care for this patient.  Please do not hesitate to contact me with questions.  Thermon Leyland, FNP [facility]

## 2022-10-08 ENCOUNTER — Ambulatory Visit: Payer: Medicare HMO | Admitting: Family Medicine

## 2022-10-09 ENCOUNTER — Ambulatory Visit: Payer: Medicare HMO | Admitting: Podiatry

## 2022-10-09 ENCOUNTER — Encounter: Payer: Self-pay | Admitting: Podiatry

## 2022-10-09 DIAGNOSIS — E1140 Type 2 diabetes mellitus with diabetic neuropathy, unspecified: Secondary | ICD-10-CM

## 2022-10-09 DIAGNOSIS — B351 Tinea unguium: Secondary | ICD-10-CM

## 2022-10-09 DIAGNOSIS — E1149 Type 2 diabetes mellitus with other diabetic neurological complication: Secondary | ICD-10-CM | POA: Diagnosis not present

## 2022-10-09 DIAGNOSIS — M79675 Pain in left toe(s): Secondary | ICD-10-CM

## 2022-10-09 DIAGNOSIS — M79674 Pain in right toe(s): Secondary | ICD-10-CM

## 2022-10-09 NOTE — Progress Notes (Signed)
This patient presents to the office with chief complaint of long thick painful nails.  Patient says the nails are painful walking and wearing shoes.  This patient is unable to self treat.  This patient is unable to trim his nails since she is unable to reach his nails. Patient says he is experiencing diabetic neuropathy symptoms.   He presents to the office for preventative foot care services.  General Appearance  Alert, conversant and in no acute stress.  Vascular  Dorsalis pedis and posterior tibial  pulses are palpable  bilaterally.  Capillary return is within normal limits  bilaterally. Temperature is within normal limits  bilaterally.  Neurologic  Senn-Weinstein monofilament wire test within normal limits  bilaterally. Muscle power within normal limits bilaterally.  Nails Thick disfigured discolored nails with subungual debris  from hallux to fifth toes bilaterally. No evidence of bacterial infection or drainage bilaterally.  Orthopedic  No limitations of motion  feet .  No crepitus or effusions noted.  Hammer toes  B/L.   Pes planus.  Skin  normotropic skin with no porokeratosis noted bilaterally.  No signs of infections or ulcers noted.     Onychomycosis  Nails  B/L.  Pain in right toes  Pain in left toes    Debridement of nails both feet  with nail nipper followed trimming the nails with dremel tool.     RTC 3 months.   Norinne Jeane DPM  

## 2022-11-17 ENCOUNTER — Encounter: Payer: Self-pay | Admitting: Gastroenterology

## 2022-11-17 ENCOUNTER — Ambulatory Visit (AMBULATORY_SURGERY_CENTER): Payer: Medicare HMO

## 2022-11-17 VITALS — Ht 69.0 in | Wt 239.0 lb

## 2022-11-17 DIAGNOSIS — Z86010 Personal history of colonic polyps: Secondary | ICD-10-CM

## 2022-11-17 MED ORDER — NA SULFATE-K SULFATE-MG SULF 17.5-3.13-1.6 GM/177ML PO SOLN: 1.0000 | Freq: Once | ORAL | 0 refills | Status: AC

## 2022-11-17 NOTE — Progress Notes (Signed)

## 2022-12-07 ENCOUNTER — Encounter: Payer: Self-pay | Admitting: Certified Registered Nurse Anesthetist

## 2022-12-09 ENCOUNTER — Ambulatory Visit (AMBULATORY_SURGERY_CENTER): Payer: Medicare HMO | Admitting: Gastroenterology

## 2022-12-09 ENCOUNTER — Other Ambulatory Visit: Payer: Self-pay | Admitting: Physician Assistant

## 2022-12-09 ENCOUNTER — Encounter: Payer: Self-pay | Admitting: Gastroenterology

## 2022-12-09 VITALS — BP 125/62 | HR 72 | Temp 98.0°F | Resp 13 | Ht 69.0 in | Wt 239.0 lb

## 2022-12-09 DIAGNOSIS — Z09 Encounter for follow-up examination after completed treatment for conditions other than malignant neoplasm: Secondary | ICD-10-CM | POA: Diagnosis not present

## 2022-12-09 DIAGNOSIS — D123 Benign neoplasm of transverse colon: Secondary | ICD-10-CM

## 2022-12-09 DIAGNOSIS — D120 Benign neoplasm of cecum: Secondary | ICD-10-CM

## 2022-12-09 DIAGNOSIS — Z86010 Personal history of colonic polyps: Secondary | ICD-10-CM | POA: Diagnosis not present

## 2022-12-09 MED ORDER — SODIUM CHLORIDE 0.9 % IV SOLN: 500.0000 mL | Freq: Once | INTRAVENOUS | Status: DC

## 2022-12-09 NOTE — Progress Notes (Signed)
[facility] History and Physical   Primary Care Physician:  Margot Ables, MD (Inactive)   Reason for Procedure:  History of adenomatous colon polyps  Plan:    Surveillance colonoscopy with possible interventions as needed     HPI: Nicholas Burns is a very pleasant 72 y.o. male here for surveillance colonoscopy. Denies any nausea, vomiting, abdominal pain, melena or bright red blood per rectum  The risks and benefits as well as alternatives of endoscopic procedure(s) have been discussed and reviewed. All questions answered. The patient agrees to proceed.    Past Medical History:  Diagnosis Date   Allergy    Arthritis    all over per pt    Constipation    pt states issues 1-2 x a month    COPD (chronic obstructive pulmonary disease) (HCC)    Diabetes mellitus without complication (HCC)    Eczema    GERD (gastroesophageal reflux disease)    seldom   Hip pain, acute    left   Hyperlipidemia    Hypertension    Poor historian    Recurrent upper respiratory infection (URI)    Seasonal asthma    Teeth missing     Past Surgical History:  Procedure Laterality Date   CATARACT EXTRACTION W/PHACO Right 02/17/2013   Procedure: CATARACT EXTRACTION PHACO AND INTRAOCULAR LENS PLACEMENT (IOC);  Surgeon: Gemma Payor, MD;  Location: [facility];  Service: Ophthalmology;  Laterality: Right;  CDE:11.32   CIRCUMCISION  age 59   [facility]    Prior to Admission medications   Medication Sig Start Date End Date Taking? Authorizing Provider  albuterol (VENTOLIN HFA) 108 (90 Base) MCG/ACT inhaler INHALE 2 PUFFS EVERY 4 HOURS AS NEEDED 03/10/22  Yes Alfonse Spruce, MD  Blood Glucose Monitoring Suppl (ONETOUCH VERIO) w/Device KIT 1 each 3 (three) times daily by Does not apply route. 03/27/17  Yes Johna Sheriff, MD  famotidine (PEPCID) 20 MG tablet One at bedtime 03/16/17  Yes Nyoka Cowden, MD  glucose blood (ONETOUCH VERIO) test strip 1 each 3 (three) times daily by  Other route. Use as instructed 03/27/17  Yes Johna Sheriff, MD  TRELEGY ELLIPTA 200-62.5-25 MCG/ACT AEPB Inhale 1 puff into the lungs daily. 04/03/22  Yes Alfonse Spruce, MD  albuterol (PROVENTIL) (2.5 MG/3ML) 0.083% nebulizer solution Take 3 mLs (2.5 mg total) by nebulization every 6 (six) hours as needed for wheezing or shortness of breath. 05/31/21   Alfonse Spruce, MD  amLODipine (NORVASC) 5 MG tablet Take 5 mg by mouth daily. 06/30/19   [provider]  azelastine (ASTELIN) 0.1 % nasal spray 2 sprays per nostril 1-2 times daily as needed. Patient not taking: Reported on 11/17/2022 04/03/22   Alfonse Spruce, MD  cetirizine (ZYRTEC) 10 MG tablet Take 1 tablet (10 mg total) by mouth daily. 04/03/22   Alfonse Spruce, MD  EPINEPHrine 0.3 mg/0.3 mL IJ SOAJ injection SMARTSIG:1 Pre-Filled Pen Syringe IM As Directed Patient not taking: Reported on 11/17/2022 05/31/21   Alfonse Spruce, MD  FARXIGA 10 MG TABS tablet Take 10 mg by mouth daily. 08/27/22   [provider]  fluticasone (FLONASE) 50 MCG/ACT nasal spray Place 2 sprays into both nostrils daily as needed. Uses as needed. Patient not taking: Reported on 11/17/2022 04/03/22   Alfonse Spruce, MD  glipiZIDE (GLUCOTROL) 5 MG tablet Take 5 mg by mouth daily. 10/11/19   [provider]  hydrochlorothiazide (HYDRODIURIL) 25 MG tablet  12/09/18   [provider]  irbesartan (AVAPRO) 150 MG tablet Take 1 tablet (150 mg total) by mouth daily. 03/25/18   Johna Sheriff, MD  MEDROL 4 MG TBPK tablet Take 4 mg by mouth as directed. Patient not taking: Reported on 08/28/2022 05/29/21   [provider]  memantine (NAMENDA) 10 MG tablet Take 1 tablet (10 mg at night) for 2 weeks, then increase to 1 tablet (10 mg) twice a day 06/12/21   Marcos Eke, PA-C  montelukast (SINGULAIR) 10 MG tablet Take 1 tablet (10 mg total) by mouth at bedtime. 04/03/22   Alfonse Spruce, MD   omeprazole (PRILOSEC) 20 MG capsule Take 1 capsule (20 mg total) by mouth daily. 10/22/18   Alfonse Spruce, MD  [facility] DELICA LANCETS FINE MISC 1 each 3 (three) times daily by Does not apply route. Patient not taking: Reported on 11/17/2022 03/27/17   Johna Sheriff, MD  OVER THE COUNTER MEDICATION daily. Medication to help urine flow and frequency -? Name Patient not taking: Reported on 11/17/2022    [provider]  pravastatin (PRAVACHOL) 40 MG tablet Take 1 tablet (40 mg total) by mouth daily. 03/25/18   Johna Sheriff, MD  SPIRIVA HANDIHALER 18 MCG inhalation capsule 1 capsule daily. 10/08/21   [provider]  triamcinolone cream (KENALOG) 0.1 % Apply topically 2 (two) times daily. 09/20/21   Alfonse Spruce, MD    Current Outpatient Medications  Medication Sig Dispense Refill   albuterol (VENTOLIN HFA) 108 (90 Base) MCG/ACT inhaler INHALE 2 PUFFS EVERY 4 HOURS AS NEEDED 18 g 1   Blood Glucose Monitoring Suppl (ONETOUCH VERIO) w/Device KIT 1 each 3 (three) times daily by Does not apply route. 1 kit 0   famotidine (PEPCID) 20 MG tablet One at bedtime     glucose blood (ONETOUCH VERIO) test strip 1 each 3 (three) times daily by Other route. Use as instructed 100 each 12   TRELEGY ELLIPTA 200-62.5-25 MCG/ACT AEPB Inhale 1 puff into the lungs daily. 60 each 5   albuterol (PROVENTIL) (2.5 MG/3ML) 0.083% nebulizer solution Take 3 mLs (2.5 mg total) by nebulization every 6 (six) hours as needed for wheezing or shortness of breath. 150 mL 1   amLODipine (NORVASC) 5 MG tablet Take 5 mg by mouth daily.     azelastine (ASTELIN) 0.1 % nasal spray 2 sprays per nostril 1-2 times daily as needed. (Patient not taking: Reported on 11/17/2022) 30 mL 5   cetirizine (ZYRTEC) 10 MG tablet Take 1 tablet (10 mg total) by mouth daily. 30 tablet 5   EPINEPHrine 0.3 mg/0.3 mL IJ SOAJ injection SMARTSIG:1 Pre-Filled Pen Syringe IM As Directed (Patient not taking: Reported on 11/17/2022) 1  each 0   FARXIGA 10 MG TABS tablet Take 10 mg by mouth daily.     fluticasone (FLONASE) 50 MCG/ACT nasal spray Place 2 sprays into both nostrils daily as needed. Uses as needed. (Patient not taking: Reported on 11/17/2022) 16 g 5   glipiZIDE (GLUCOTROL) 5 MG tablet Take 5 mg by mouth daily.     hydrochlorothiazide (HYDRODIURIL) 25 MG tablet      irbesartan (AVAPRO) 150 MG tablet Take 1 tablet (150 mg total) by mouth daily. 90 tablet 1   MEDROL 4 MG TBPK tablet Take 4 mg by mouth as directed. (Patient not taking: Reported on 08/28/2022)     memantine (NAMENDA) 10 MG tablet Take 1 tablet (10 mg at night) for 2 weeks, then increase to 1 tablet (  10 mg) twice a day 60 tablet 11   montelukast (SINGULAIR) 10 MG tablet Take 1 tablet (10 mg total) by mouth at bedtime. 30 tablet 5   omeprazole (PRILOSEC) 20 MG capsule Take 1 capsule (20 mg total) by mouth daily. 30 capsule 5   ONETOUCH DELICA LANCETS FINE MISC 1 each 3 (three) times daily by Does not apply route. (Patient not taking: Reported on 11/17/2022) 100 each 12   OVER THE COUNTER MEDICATION daily. Medication to help urine flow and frequency -? Name (Patient not taking: Reported on 11/17/2022)     pravastatin (PRAVACHOL) 40 MG tablet Take 1 tablet (40 mg total) by mouth daily. 90 tablet 3   SPIRIVA HANDIHALER 18 MCG inhalation capsule 1 capsule daily.     triamcinolone cream (KENALOG) 0.1 % Apply topically 2 (two) times daily. 30 g 5   Current Facility-Administered Medications  Medication Dose Route Frequency Provider Last Rate Last Admin   0.9 %  sodium chloride infusion  500 mL Intravenous Once Maxamus Colao, Eleonore Chiquito, MD       Benralizumab SOSY 30 mg  30 mg Subcutaneous Q28 days Alfonse Spruce, MD   30 mg at 03/24/18 0930    Allergies as of 12/09/2022   (No Known Allergies)    Family History  Problem Relation Age of Onset   Hypertension Mother    Arthritis Father    Rheum arthritis Father    Asthma Son    Colon polyps Son    Mental  illness Son    Colon cancer Neg Hx    Esophageal cancer Neg Hx    Rectal cancer Neg Hx    Stomach cancer Neg Hx    Allergic rhinitis Neg Hx    Angioedema Neg Hx    Atopy Neg Hx    Eczema Neg Hx    Immunodeficiency Neg Hx    Urticaria Neg Hx     Social History   Socioeconomic History   Marital status: Single    Spouse name: Not on file   Number of children: Not on file   Years of education: Not on file   Highest education level: Not on file  Occupational History   Not on file  Tobacco Use   Smoking status: Never   Smokeless tobacco: Never  Vaping Use   Vaping status: Never Used  Substance and Sexual Activity   Alcohol use: No   Drug use: No   Sexual activity: Not on file  Other Topics Concern   Not on file  Social History Narrative   Right handed   Lives with son in a one story home   Caffeine 1 cup daily   Social Determinants of Health   Financial Resource Strain: Not on file  Food Insecurity: Not on file  Transportation Needs: Not on file  Physical Activity: Not on file  Stress: Not on file  Social Connections: Not on file  Intimate Partner Violence: Not on file    Review of Systems:  All other review of systems negative except as mentioned in the HPI.  Physical Exam: Vital signs in last 24 hours: BP 138/81   Pulse (!) 54   Temp 98 F (36.7 C)   Resp 11   Ht 5\' 9"  (1.753 m)   Wt 239 lb (108.4 kg)   SpO2 94%   BMI 35.29 kg/m  General:   Alert, NAD Lungs:  Clear .   Heart:  Regular rate and rhythm Abdomen:  Soft, nontender and  nondistended. Neuro/Psych:  Alert and cooperative. Normal mood and affect. A and O x 3  Reviewed labs, radiology imaging, old records and pertinent past GI work up  Patient is appropriate for planned procedure(s) and anesthesia in an ambulatory setting   K. Scherry Ran , MD [phone]

## 2022-12-09 NOTE — Op Note (Signed)
[facility] Patient Name: Nicholas Burns Procedure Date: 12/09/2022 1:32 PM MRN: [id number] Endoscopist: Napoleon Form , MD, [id number] Age: 72 Referring MD:  Date of Birth: December 14, 1950 Gender: Male Account #: [id number] Procedure:                Colonoscopy Indications:              High risk colon cancer surveillance: Personal                            history of colonic polyps, High risk colon cancer                            surveillance: Personal history of multiple (3 or                            more) adenomas Medicines:                Monitored Anesthesia Care Procedure:                Pre-Anesthesia Assessment:                           - Prior to the procedure, a History and Physical                            was performed, and patient medications and                            allergies were reviewed. The patient's tolerance of                            previous anesthesia was also reviewed. The risks                            and benefits of the procedure and the sedation                            options and risks were discussed with the patient.                            All questions were answered, and informed consent                            was obtained. Prior Anticoagulants: The patient has                            taken no anticoagulant or antiplatelet agents. ASA                            Grade Assessment: III - A patient with severe                            systemic disease. After reviewing the risks and  benefits, the patient was deemed in satisfactory                            condition to undergo the procedure.                           After obtaining informed consent, the colonoscope                            was passed under direct vision. Throughout the                            procedure, the patient's blood pressure, pulse, and                            oxygen saturations were monitored  continuously. The                            Olympus PCF-H190DL (#[id number]) Colonoscope was                            introduced through the anus and advanced to the the                            cecum, identified by appendiceal orifice and                            ileocecal valve. The colonoscopy was performed                            without difficulty. The patient tolerated the                            procedure well. The quality of the bowel                            preparation was good. The ileocecal valve,                            appendiceal orifice, and rectum were photographed. Scope In: 1:41:17 PM Scope Out: 2:03:59 PM Scope Withdrawal Time: 0 hours 18 minutes 17 seconds  Total Procedure Duration: 0 hours 22 minutes 42 seconds  Findings:                 The perianal and digital rectal examinations were                            normal.                           A 4 mm polyp was found in the cecum. The polyp was                            sessile. The polyp was removed with a cold snare.  Resection and retrieval were complete.                           A 25 mm polyp was found in the transverse colon.                            The polyp was sessile. Biopsies were taken with a                            cold forceps for histology. Polyp resection was not                            attempted due to polyp size (too large to be                            excised). Area was tattooed with an injection of                            0.5 mL of Spot (carbon black).                           Scattered large-mouthed, medium-mouthed and                            small-mouthed diverticula were found in the sigmoid                            colon, descending colon and ascending colon.                           Non-bleeding internal hemorrhoids were found during                            retroflexion. The hemorrhoids were medium-sized. Complications:             No immediate complications. Estimated Blood Loss:     Estimated blood loss was minimal. Impression:               - One 4 mm polyp in the cecum, removed with a cold                            snare. Resected and retrieved.                           - One 25 mm polyp in the transverse colon.                            Biopsied. Tattooed.                           - Diverticulosis in the sigmoid colon, in the                            descending colon and in the ascending colon.                           -  Non-bleeding internal hemorrhoids. Recommendation:           - Patient has a contact number available for                            emergencies. The signs and symptoms of potential                            delayed complications were discussed with the                            patient. Return to normal activities tomorrow.                            Written discharge instructions were provided to the                            patient.                           - Resume previous diet.                           - Continue present medications.                           - Await pathology results.                           - Repeat colonoscopy at the next available                            appointment for EMR/polypectomy to be scheduled at                            [facility] endoscopy unit with Dr Meridee Score Napoleon Form, MD 12/09/2022 2:13:51 PM This report has been signed electronically.

## 2022-12-09 NOTE — Patient Instructions (Signed)
Handout on hemorrhoids, polyps, and diverticulosis given to patient Await pathology results Resume previous diet and continue present medications Repeat colonoscopy at the next available appointment for EMR/polypectomy to be scheduled at [facility] endoscopy unit with Dr. Meridee Score     YOU HAD AN ENDOSCOPIC PROCEDURE TODAY AT THE [facility]:   Refer to the procedure report that was given to you for any specific questions about what was found during the examination.  If the procedure report does not answer your questions, please call your gastroenterologist to clarify.  If you requested that your care partner not be given the details of your procedure findings, then the procedure report has been included in a sealed envelope for you to review at your convenience later.  YOU SHOULD EXPECT: Some feelings of bloating in the abdomen. Passage of more gas than usual.  Walking can help get rid of the air that was put into your GI tract during the procedure and reduce the bloating. If you had a lower endoscopy (such as a colonoscopy or flexible sigmoidoscopy) you may notice spotting of blood in your stool or on the toilet paper. If you underwent a bowel prep for your procedure, you may not have a normal bowel movement for a few days.  Please Note:  You might notice some irritation and congestion in your nose or some drainage.  This is from the oxygen used during your procedure.  There is no need for concern and it should clear up in a day or so.  SYMPTOMS TO REPORT IMMEDIATELY:  Following lower endoscopy (colonoscopy or flexible sigmoidoscopy):  Excessive amounts of blood in the stool  Significant tenderness or worsening of abdominal pains  Swelling of the abdomen that is new, acute  Fever of 100F or higher   For urgent or emergent issues, a gastroenterologist can be reached at any hour by calling [phone]. Do not use MyChart messaging for urgent concerns.    DIET:  We do recommend a  small meal at first, but then you may proceed to your regular diet.  Drink plenty of fluids but you should avoid alcoholic beverages for 24 hours.  ACTIVITY:  You should plan to take it easy for the rest of today and you should NOT DRIVE or use heavy machinery until tomorrow (because of the sedation medicines used during the test).    FOLLOW UP: Our staff will call the number listed on your records the next business day following your procedure.  We will call around 7:15- 8:00 am to check on you and address any questions or concerns that you may have regarding the information given to you following your procedure. If we do not reach you, we will leave a message.     If any biopsies were taken you will be contacted by phone or by letter within the next 1-3 weeks.  Please call us at [phone] if you have not heard about the biopsies in 3 weeks.    SIGNATURES/CONFIDENTIALITY: You and/or your care partner have signed paperwork which will be entered into your electronic medical record.  These signatures attest to the fact that that the information above on your After Visit Summary has been reviewed and is understood.  Full responsibility of the confidentiality of this discharge information lies with you and/or your care-partner.

## 2022-12-09 NOTE — Progress Notes (Signed)
Report given to PACU, vss 

## 2022-12-09 NOTE — Progress Notes (Signed)
Called to room to assist during endoscopic procedure.  Patient ID and intended procedure confirmed with present staff. Received instructions for my participation in the procedure from the performing physician.  

## 2022-12-10 ENCOUNTER — Telehealth: Payer: Self-pay

## 2022-12-10 NOTE — Telephone Encounter (Signed)
Attempted f/u call. No answer, left VM. 

## 2022-12-23 ENCOUNTER — Other Ambulatory Visit: Payer: Self-pay

## 2022-12-23 ENCOUNTER — Ambulatory Visit: Payer: Medicare HMO | Admitting: Physician Assistant

## 2022-12-23 DIAGNOSIS — Z86010 Personal history of colonic polyps: Secondary | ICD-10-CM

## 2022-12-23 MED ORDER — NA SULFATE-K SULFATE-MG SULF 17.5-3.13-1.6 GM/177ML PO SOLN: 1.0000 | Freq: Once | ORAL | 0 refills | Status: AC

## 2023-01-12 ENCOUNTER — Ambulatory Visit (INDEPENDENT_AMBULATORY_CARE_PROVIDER_SITE_OTHER): Payer: Medicare HMO | Admitting: Podiatry

## 2023-01-12 ENCOUNTER — Encounter: Payer: Self-pay | Admitting: Podiatry

## 2023-01-12 DIAGNOSIS — M79675 Pain in left toe(s): Secondary | ICD-10-CM

## 2023-01-12 DIAGNOSIS — B351 Tinea unguium: Secondary | ICD-10-CM | POA: Diagnosis not present

## 2023-01-12 DIAGNOSIS — M79674 Pain in right toe(s): Secondary | ICD-10-CM

## 2023-01-12 DIAGNOSIS — E1140 Type 2 diabetes mellitus with diabetic neuropathy, unspecified: Secondary | ICD-10-CM

## 2023-01-12 DIAGNOSIS — E1149 Type 2 diabetes mellitus with other diabetic neurological complication: Secondary | ICD-10-CM

## 2023-01-12 NOTE — Progress Notes (Signed)
This patient presents to the office with chief complaint of long thick painful nails.  Patient says the nails are painful walking and wearing shoes.  This patient is unable to self treat.  This patient is unable to trim his nails since she is unable to reach his nails. Patient says he is experiencing diabetic neuropathy symptoms.   He presents to the office for preventative foot care services.  General Appearance  Alert, conversant and in no acute stress.  Vascular  Dorsalis pedis and posterior tibial  pulses are palpable  bilaterally.  Capillary return is within normal limits  bilaterally. Temperature is within normal limits  bilaterally.  Neurologic  Senn-Weinstein monofilament wire test within normal limits  bilaterally. Muscle power within normal limits bilaterally.  Nails Thick disfigured discolored nails with subungual debris  from hallux to fifth toes bilaterally. No evidence of bacterial infection or drainage bilaterally.  Orthopedic  No limitations of motion  feet .  No crepitus or effusions noted.  Hammer toes  B/L.   Pes planus.  Skin  normotropic skin with no porokeratosis noted bilaterally.  No signs of infections or ulcers noted.     Onychomycosis  Nails  B/L.  Pain in right toes  Pain in left toes    Debridement of nails both feet  with nail nipper followed trimming the nails with dremel tool.     RTC 3 months.   Gregory Mayer DPM  

## 2023-01-23 ENCOUNTER — Other Ambulatory Visit: Payer: Self-pay | Admitting: Physician Assistant

## 2023-02-17 ENCOUNTER — Encounter: Payer: Self-pay | Admitting: Gastroenterology

## 2023-02-18 ENCOUNTER — Ambulatory Visit: Payer: Medicare HMO | Admitting: Gastroenterology

## 2023-02-24 ENCOUNTER — Encounter (HOSPITAL_COMMUNITY): Payer: Self-pay | Admitting: Gastroenterology

## 2023-02-24 NOTE — Progress Notes (Signed)
Attempted to obtain medical history for pre op call via telephone, unable to reach at this time. HIPAA compliant voicemail message left requesting return call to pre surgical testing department.

## 2023-03-03 ENCOUNTER — Ambulatory Visit (HOSPITAL_COMMUNITY): Payer: Medicare HMO | Admitting: Anesthesiology

## 2023-03-03 ENCOUNTER — Ambulatory Visit (HOSPITAL_COMMUNITY)
Admission: RE | Admit: 2023-03-03 | Discharge: 2023-03-03 | Disposition: A | Discharge status: Home / Self Care | Payer: Medicare HMO | Attending: Gastroenterology | Admitting: Gastroenterology

## 2023-03-03 ENCOUNTER — Encounter (HOSPITAL_COMMUNITY)
Admission: RE | Disposition: A | Discharge status: Home / Self Care | Payer: Self-pay | Source: Home / Self Care | Attending: Gastroenterology

## 2023-03-03 ENCOUNTER — Encounter (HOSPITAL_COMMUNITY): Payer: Self-pay | Admitting: Gastroenterology

## 2023-03-03 ENCOUNTER — Other Ambulatory Visit: Payer: Self-pay

## 2023-03-03 DIAGNOSIS — K219 Gastro-esophageal reflux disease without esophagitis: Secondary | ICD-10-CM | POA: Diagnosis not present

## 2023-03-03 DIAGNOSIS — Z860101 Personal history of adenomatous and serrated colon polyps: Secondary | ICD-10-CM

## 2023-03-03 DIAGNOSIS — Z1211 Encounter for screening for malignant neoplasm of colon: Secondary | ICD-10-CM | POA: Diagnosis not present

## 2023-03-03 DIAGNOSIS — K5730 Diverticulosis of large intestine without perforation or abscess without bleeding: Secondary | ICD-10-CM

## 2023-03-03 DIAGNOSIS — J4489 Other specified chronic obstructive pulmonary disease: Secondary | ICD-10-CM | POA: Insufficient documentation

## 2023-03-03 DIAGNOSIS — K635 Polyp of colon: Secondary | ICD-10-CM | POA: Diagnosis not present

## 2023-03-03 DIAGNOSIS — I10 Essential (primary) hypertension: Secondary | ICD-10-CM | POA: Insufficient documentation

## 2023-03-03 DIAGNOSIS — D126 Benign neoplasm of colon, unspecified: Secondary | ICD-10-CM | POA: Diagnosis not present

## 2023-03-03 DIAGNOSIS — Z83719 Family history of colon polyps, unspecified: Secondary | ICD-10-CM | POA: Diagnosis not present

## 2023-03-03 DIAGNOSIS — D127 Benign neoplasm of rectosigmoid junction: Secondary | ICD-10-CM | POA: Insufficient documentation

## 2023-03-03 DIAGNOSIS — K641 Second degree hemorrhoids: Secondary | ICD-10-CM | POA: Diagnosis not present

## 2023-03-03 DIAGNOSIS — E785 Hyperlipidemia, unspecified: Secondary | ICD-10-CM | POA: Diagnosis not present

## 2023-03-03 DIAGNOSIS — K644 Residual hemorrhoidal skin tags: Secondary | ICD-10-CM | POA: Diagnosis not present

## 2023-03-03 DIAGNOSIS — D123 Benign neoplasm of transverse colon: Secondary | ICD-10-CM | POA: Diagnosis not present

## 2023-03-03 DIAGNOSIS — E119 Type 2 diabetes mellitus without complications: Secondary | ICD-10-CM

## 2023-03-03 HISTORY — PX: POLYPECTOMY: SHX5525

## 2023-03-03 HISTORY — PX: ENDOSCOPIC MUCOSAL RESECTION: SHX6839

## 2023-03-03 HISTORY — PX: COLONOSCOPY WITH PROPOFOL: SHX5780

## 2023-03-03 HISTORY — PX: HEMOSTASIS CLIP PLACEMENT: SHX6857

## 2023-03-03 HISTORY — PX: SUBMUCOSAL LIFTING INJECTION: SHX6855

## 2023-03-03 LAB — GLUCOSE, CAPILLARY: Glucose-Capillary: 115 mg/dL — ABNORMAL HIGH (ref 70–99)

## 2023-03-03 SURGERY — COLONOSCOPY WITH PROPOFOL
Anesthesia: Monitor Anesthesia Care

## 2023-03-03 MED ORDER — MIDAZOLAM HCL 2 MG/2ML IJ SOLN
INTRAMUSCULAR | Status: AC
  Filled 2023-03-03: qty 2

## 2023-03-03 MED ORDER — LIDOCAINE HCL (CARDIAC) PF 100 MG/5ML IV SOSY
PREFILLED_SYRINGE | INTRAVENOUS | Status: DC | PRN
  Administered 2023-03-03: 100 mg via INTRAVENOUS

## 2023-03-03 MED ORDER — PROPOFOL 1000 MG/100ML IV EMUL
INTRAVENOUS | Status: AC
  Filled 2023-03-03: qty 100

## 2023-03-03 MED ORDER — PROPOFOL 10 MG/ML IV BOLUS
INTRAVENOUS | Status: DC | PRN
  Administered 2023-03-03: 700 mg via INTRAVENOUS

## 2023-03-03 MED ORDER — FENTANYL CITRATE (PF) 100 MCG/2ML IJ SOLN
INTRAMUSCULAR | Status: AC
  Filled 2023-03-03: qty 2

## 2023-03-03 MED ORDER — SODIUM CHLORIDE 0.9 % IV SOLN: INTRAVENOUS | Status: DC | PRN

## 2023-03-03 SURGICAL SUPPLY — 22 items

## 2023-03-03 NOTE — Anesthesia Postprocedure Evaluation (Signed)
Anesthesia Post Note  Patient: Nicholas Burns  Procedure(s) Performed: COLONOSCOPY WITH PROPOFOL ENDOSCOPIC MUCOSAL RESECTION POLYPECTOMY ABLATION HEMOSTASIS CLIP PLACEMENT     Patient location during evaluation: PACU Anesthesia Type: MAC Level of consciousness: awake and alert Pain management: pain level controlled Vital Signs Assessment: post-procedure vital signs reviewed and stable Respiratory status: spontaneous breathing, nonlabored ventilation, respiratory function stable and patient connected to nasal cannula oxygen Cardiovascular status: stable and blood pressure returned to baseline Postop Assessment: no apparent nausea or vomiting Anesthetic complications: no  No notable events documented.  Last Vitals:  Vitals:   03/03/23 1140 03/03/23 1150  BP: (!) 119/99 113/71  Pulse: 60 (!) 53  Resp: 15 12  Temp:    SpO2: 96% 97%    Last Pain:  Vitals:   03/03/23 1150  TempSrc:   PainSc: 0-No pain                 Jolane Bankhead S

## 2023-03-03 NOTE — Discharge Instructions (Signed)
YOU HAD AN ENDOSCOPIC PROCEDURE TODAY: Refer to the procedure report and other information in the discharge instructions given to you for any specific questions about what was found during the examination. If this information does not answer your questions, please call [place] office at [phone] to clarify.   YOU SHOULD EXPECT: Some feelings of bloating in the abdomen. Passage of more gas than usual. Walking can help get rid of the air that was put into your GI tract during the procedure and reduce the bloating. If you had a lower endoscopy (such as a colonoscopy or flexible sigmoidoscopy) you may notice spotting of blood in your stool or on the toilet paper. Some abdominal soreness may be present for a day or two, also.  DIET: Your first meal following the procedure should be a light meal and then it is ok to progress to your normal diet. A half-sandwich or bowl of soup is an example of a good first meal. Heavy or fried foods are harder to digest and may make you feel nauseous or bloated. Drink plenty of fluids but you should avoid alcoholic beverages for 24 hours. If you had a esophageal dilation, please see attached instructions for diet.    ACTIVITY: Your care partner should take you home directly after the procedure. You should plan to take it easy, moving slowly for the rest of the day. You can resume normal activity the day after the procedure however YOU SHOULD NOT DRIVE, use power tools, machinery or perform tasks that involve climbing or major physical exertion for 24 hours (because of the sedation medicines used during the test).   SYMPTOMS TO REPORT IMMEDIATELY: A gastroenterologist can be reached at any hour. Please call [phone]  for any of the following symptoms:  Following lower endoscopy (colonoscopy, flexible sigmoidoscopy) Excessive amounts of blood in the stool  Significant tenderness, worsening of abdominal pains  Swelling of the abdomen that is new, acute  Fever of 100 or  higher  Following upper endoscopy (EGD, EUS, ERCP, esophageal dilation) Vomiting of blood or coffee ground material  New, significant abdominal pain  New, significant chest pain or pain under the shoulder blades  Painful or persistently difficult swallowing  New shortness of breath  Black, tarry-looking or red, bloody stools  FOLLOW UP:  If any biopsies were taken you will be contacted by phone or by letter within the next 1-3 weeks. Call [phone]  if you have not heard about the biopsies in 3 weeks.  Please also call with any specific questions about appointments or follow up tests.YOU HAD AN ENDOSCOPIC PROCEDURE TODAY: Refer to the procedure report and other information in the discharge instructions given to you for any specific questions about what was found during the examination. If this information does not answer your questions, please call [place] office at [phone] to clarify.   YOU SHOULD EXPECT: Some feelings of bloating in the abdomen. Passage of more gas than usual. Walking can help get rid of the air that was put into your GI tract during the procedure and reduce the bloating. If you had a lower endoscopy (such as a colonoscopy or flexible sigmoidoscopy) you may notice spotting of blood in your stool or on the toilet paper. Some abdominal soreness may be present for a day or two, also.  DIET: Your first meal following the procedure should be a light meal and then it is ok to progress to your normal diet. A half-sandwich or bowl of soup is an example of a  good first meal. Heavy or fried foods are harder to digest and may make you feel nauseous or bloated. Drink plenty of fluids but you should avoid alcoholic beverages for 24 hours. If you had a esophageal dilation, please see attached instructions for diet.    ACTIVITY: Your care partner should take you home directly after the procedure. You should plan to take it easy, moving slowly for the rest of the day. You can resume  normal activity the day after the procedure however YOU SHOULD NOT DRIVE, use power tools, machinery or perform tasks that involve climbing or major physical exertion for 24 hours (because of the sedation medicines used during the test).   SYMPTOMS TO REPORT IMMEDIATELY: A gastroenterologist can be reached at any hour. Please call [phone]  for any of the following symptoms:  Following lower endoscopy (colonoscopy, flexible sigmoidoscopy) Excessive amounts of blood in the stool  Significant tenderness, worsening of abdominal pains  Swelling of the abdomen that is new, acute  Fever of 100 or higher   FOLLOW UP:  If any biopsies were taken you will be contacted by phone or by letter within the next 1-3 weeks. Call [phone]  if you have not heard about the biopsies in 3 weeks.  Please also call with any specific questions about appointments or follow up tests.

## 2023-03-03 NOTE — Anesthesia Preprocedure Evaluation (Signed)
Anesthesia Evaluation  Patient identified by MRN, date of birth, ID band Patient awake    Reviewed: Allergy & Precautions, H&P , NPO status , Patient's Chart, lab work & pertinent test results  Airway Mallampati: III  TM Distance: <3 FB Neck ROM: Full    Dental  (+) Missing, Poor Dentition, Chipped   Pulmonary asthma , COPD   Pulmonary exam normal breath sounds clear to auscultation       Cardiovascular hypertension, Normal cardiovascular exam Rhythm:Regular Rate:Normal     Neuro/Psych       Dementia negative neurological ROS     GI/Hepatic Neg liver ROS,GERD  ,,  Endo/Other  diabetes, Type 2    Renal/GU negative Renal ROS  negative genitourinary   Musculoskeletal negative musculoskeletal ROS (+)    Abdominal   Peds negative pediatric ROS (+)  Hematology negative hematology ROS (+)   Anesthesia Other Findings   Reproductive/Obstetrics negative OB ROS                             Anesthesia Physical Anesthesia Plan  ASA: 3  Anesthesia Plan: MAC   Post-op Pain Management: Minimal or no pain anticipated   Induction: Intravenous  PONV Risk Score and Plan: 1 and Propofol infusion and Treatment may vary due to age or medical condition  Airway Management Planned: Simple Face Mask  Additional Equipment:   Intra-op Plan:   Post-operative Plan:   Informed Consent: I have reviewed the patients History and Physical, chart, labs and discussed the procedure including the risks, benefits and alternatives for the proposed anesthesia with the patient or authorized representative who has indicated his/her understanding and acceptance.     Dental advisory given  Plan Discussed with: CRNA and Surgeon  Anesthesia Plan Comments:        Anesthesia Quick Evaluation

## 2023-03-03 NOTE — Transfer of Care (Signed)
Immediate Anesthesia Transfer of Care Note  Patient: Nicholas Burns  Procedure(s) Performed: COLONOSCOPY WITH PROPOFOL ENDOSCOPIC MUCOSAL RESECTION POLYPECTOMY ABLATION HEMOSTASIS CLIP PLACEMENT  Patient Location: PACU and Endoscopy Unit  Anesthesia Type:MAC  Level of Consciousness: awake and alert   Airway & Oxygen Therapy: Patient Spontanous Breathing  Post-op Assessment: Report given to RN and Post -op Vital signs reviewed and stable  Post vital signs: Reviewed and stable  Last Vitals:  Vitals Value Taken Time  BP 126/82 03/03/23 1116  Temp 35.7 C 03/03/23 1116  Pulse 65 03/03/23 1118  Resp 18 03/03/23 1118  SpO2 98 % 03/03/23 1118  Vitals shown include unfiled device data.  Last Pain:  Vitals:   03/03/23 1116  TempSrc: Temporal  PainSc: 0-No pain         Complications: No notable events documented.

## 2023-03-03 NOTE — Op Note (Addendum)
[facility] Patient Name: Nicholas Burns Procedure Date: 03/03/2023 MRN: [id number] Attending MD: [name] , MD, [id number] Date of Birth: [birth year] CSN: [id number] Age: 72 Admit Type: Outpatient Procedure:                Colonoscopy Indications:              Excision of colonic polyp (transverse colon polyp ( Providers:                [name], MD, Lorenza Evangelist, RN, Priscella Mann, Technician Referring MD:             Napoleon Form, MD, Margot Ables Medicines:                Monitored Anesthesia Care Complications:            No immediate complications. Estimated Blood Loss:     Estimated blood loss was minimal. Procedure:                Pre-Anesthesia Assessment:                           - Prior to the procedure, a History and Physical                            was performed, and patient medications and                            allergies were reviewed. The patient's tolerance of                            previous anesthesia was also reviewed. The risks                            and benefits of the procedure and the sedation                            options and risks were discussed with the patient.                            All questions were answered, and informed consent                            was obtained. Prior Anticoagulants: The patient has                            taken no anticoagulant or antiplatelet agents. ASA                            Grade Assessment: III - A patient with severe                            systemic disease. After reviewing the risks and  benefits, the patient was deemed in satisfactory                            condition to undergo the procedure.                           After obtaining informed consent, the colonoscope                            was passed under direct vision. Throughout the                            procedure,  the patient's blood pressure, pulse, and                            oxygen saturations were monitored continuously. The                            CF-HQ190L ([id number]) Olympus colonoscope was                            introduced through the anus and advanced to the the                            cecum, identified by appendiceal orifice and                            ileocecal valve. The colonoscopy was performed                            without difficulty. The patient tolerated the                            procedure. The quality of the bowel preparation was                            adequate. The ileocecal valve, appendiceal orifice,                            and rectum were photographed. Scope In: 10:08:31 AM Scope Out: 11:07:01 AM Scope Withdrawal Time: 0 hours 54 minutes 27 seconds  Total Procedure Duration: 0 hours 58 minutes 30 seconds  Findings:      The digital rectal exam findings include hemorrhoids. Pertinent       negatives include no palpable rectal lesions.      A large amount of semi-liquid stool was found in the entire colon,       making visualization difficult. Lavage of the area was performed using       copious amounts, resulting in clearance with adequate visualization.      The colon (entire examined portion) revealed significantly excessive       looping.      A greater than 50 mm polyp was found in the distal transverse colon (one       half circumferential of the wall). The polyp was granular lateral  spreading. Tattoo noted distal to the polyp. Preparations were made for       attempt at mucosal resection. Demarcation of the lesion was performed       with high-definition white light and narrow band imaging to clearly       identify the boundaries of the lesion. EverLift was injected to raise       the lesion. Piecemeal mucosal resection using a snare was performed.       Resection and retrieval were complete. Resected tissue margins were        examined and clear of polyp tissue. Fulguration to ablate the lesion       margin by snare tip soft coagulation was successful. To further prevent       bleeding after mucosal resection, five hemostatic clips were       successfully placed (MR conditional). There was no bleeding at the end       of the procedure.      Three sessile polyps were found in the recto-sigmoid colon and       transverse colon. The polyps were 2 to 4 mm in size. These polyps were       removed with a cold snare. Resection and retrieval were complete.      Multiple small-mouthed diverticula were found in the recto-sigmoid colon       and sigmoid colon.      Non-bleeding non-thrombosed external and internal hemorrhoids were found       during retroflexion, during perianal exam and during digital exam. The       hemorrhoids were Grade II (internal hemorrhoids that prolapse but reduce       spontaneously). Impression:               - Hemorrhoids found on digital rectal exam.                           - Stool in the entire examined colon - lavaged with                            adequate visualization.                           - There was significant looping of the colon.                           - One greater than 50 mm polyp in the distal                            transverse colon, removed with piecemeal mucosal                            resection. Resected and retrieved. Treated with                            STSC. Clips (MR conditional) were placed.                            Previously placed tattoo noted distal to polyp.                           -  Three 2 to 4 mm polyps at the recto-sigmoid colon                            and in the transverse colon, removed with a cold                            snare. Resected and retrieved.                           - Diverticulosis in the recto-sigmoid colon and in                            the sigmoid colon.                           - Non-bleeding non-thrombosed  external and internal                            hemorrhoids. Moderate Sedation:      Not Applicable - Patient had care per Anesthesia. Recommendation:           - The patient will be observed post-procedure,                            until all discharge criteria are met.                           - Discharge patient to home.                           - Patient has a contact number available for                            emergencies. The signs and symptoms of potential                            delayed complications were discussed with the                            patient. Return to normal activities tomorrow.                            Written discharge instructions were provided to the                            patient.                           - High fiber diet.                           - Use FiberCon 1-2 tablets PO daily.                           - Minimize nonsteroidal medication use for the next  1 to 2 weeks to decrease risk of post                            interventional bleeding.                           - Continue present medications.                           - Await pathology results.                           - Repeat colonoscopy in 6 to 9 months for                            surveillance.                           - The findings and recommendations were discussed                            with the patient.                           - The findings and recommendations were discussed                            with the patient's family. Procedure Code(s):        --- Professional ---                           [phone], Colonoscopy, flexible; with endoscopic                            mucosal resection                           [id number], 59, Colonoscopy, flexible; with removal of                            tumor(s), polyp(s), or other lesion(s) by snare                            technique Diagnosis Code(s):        --- Professional ---                            K64.1, Second degree hemorrhoids                           D12.7, Benign neoplasm of rectosigmoid junction                           D12.3, Benign neoplasm of transverse colon (hepatic                            flexure or splenic flexure)  K63.5, Polyp of colon                           K57.30, Diverticulosis of large intestine without                            perforation or abscess without bleeding CPT copyright 2022 American Medical Association. All rights reserved. The codes documented in this report are preliminary and upon coder review may  be revised to meet current compliance requirements. [name], MD 03/03/2023 11:20:41 AM Number of Addenda: 0

## 2023-03-03 NOTE — H&P (Signed)
GASTROENTEROLOGY PROCEDURE H&P NOTE   Primary Care Physician: Margot Ables, MD (Inactive)  HPI: Nicholas Burns is a 72 y.o. male who presents for Colonoscopy for attempt at [facility] polyp resection.  Past Medical History:  Diagnosis Date   Allergy    Arthritis    all over per pt    Constipation    pt states issues 1-2 x a month    COPD (chronic obstructive pulmonary disease) (HCC)    Diabetes mellitus without complication (HCC)    Eczema    GERD (gastroesophageal reflux disease)    seldom   Hip pain, acute    left   Hyperlipidemia    Hypertension    Poor historian    Recurrent upper respiratory infection (URI)    Seasonal asthma    Teeth missing    Past Surgical History:  Procedure Laterality Date   CATARACT EXTRACTION W/PHACO Right 02/17/2013   Procedure: CATARACT EXTRACTION PHACO AND INTRAOCULAR LENS PLACEMENT (IOC);  Surgeon: Gemma Payor, MD;  Location: [facility];  Service: Ophthalmology;  Laterality: Right;  CDE:11.32   CIRCUMCISION  age 72   [facility]   No current facility-administered medications for this encounter.   No current facility-administered medications for this encounter. No Known Allergies Family History  Problem Relation Age of Onset   Hypertension Mother    Arthritis Father    Rheum arthritis Father    Asthma Son    Colon polyps Son    Mental illness Son    Colon cancer Neg Hx    Esophageal cancer Neg Hx    Rectal cancer Neg Hx    Stomach cancer Neg Hx    Allergic rhinitis Neg Hx    Angioedema Neg Hx    Atopy Neg Hx    Eczema Neg Hx    Immunodeficiency Neg Hx    Urticaria Neg Hx    Social History   Socioeconomic History   Marital status: Single    Spouse name: Not on file   Number of children: Not on file   Years of education: Not on file   Highest education level: Not on file  Occupational History   Not on file  Tobacco Use   Smoking status: Never   Smokeless tobacco: Never  Vaping Use   Vaping status: Never Used  Substance  and Sexual Activity   Alcohol use: No   Drug use: No   Sexual activity: Not on file  Other Topics Concern   Not on file  Social History Narrative   Right handed   Lives with son in a one story home   Caffeine 1 cup daily   Social Determinants of Health   Financial Resource Strain: Not on file  Food Insecurity: Not on file  Transportation Needs: Not on file  Physical Activity: Not on file  Stress: Not on file  Social Connections: Not on file  Intimate Partner Violence: Not on file    Physical Exam: There were no vitals filed for this visit. There is no height or weight on file to calculate BMI. GEN: NAD EYE: Sclerae anicteric ENT: MMM CV: Non-tachycardic GI: Soft, NT/ND NEURO:  Alert & Oriented x 3  Lab Results: No results for input(s): "WBC", "HGB", "HCT", "PLT" in the last 72 hours. BMET No results for input(s): "NA", "K", "CL", "CO2", "GLUCOSE", "BUN", "CREATININE", "CALCIUM" in the last 72 hours. LFT No results for input(s): "PROT", "ALBUMIN", "AST", "ALT", "ALKPHOS", "BILITOT", "BILIDIR", "IBILI" in the last 72 hours. PT/INR No results for  input(s): "LABPROT", "INR" in the last 72 hours.   Impression / Plan: This is a 72 y.o.male who presents for Colonoscopy for attempt at [facility] polyp resection.  Based upon the description and endoscopic pictures I do feel that it is reasonable to pursue an Advanced Polypectomy attempt of the polyp/lesion.  We discussed some of the techniques of advanced polypectomy which include Endoscopic Mucosal Resection, OVESCO Full-Thickness Resection, Endorotor Morcellation, and Tissue Ablation via Fulguration.  We also reviewed images of typical techniques as noted above.  The risks and benefits of endoscopic evaluation were discussed with the patient; these include but are not limited to the risk of perforation, infection, bleeding, missed lesions, lack of diagnosis, severe illness requiring hospitalization, as well as anesthesia and sedation  related illnesses.  During attempts at advanced resection, the risks of bleeding and perforation/leak are increased as opposed to diagnostic and screening procedures, and that was discussed with the patient as well.   In addition, I explained that with the possible need for piecemeal resection, subsequent short-interval endoscopic evaluation for follow up and potential retreatment of the lesion/area may be necessary.  I did offer, a referral to surgery in order for patient to have opportunity to discuss surgical management/intervention prior to finalizing decision for attempt at endoscopic removal, however, the patient deferred on this.  If, after attempt at removal of the polyp/lesion, it is found that the patient has a complication or that an invasive lesion or malignant lesion is found, or that the polyp/lesion continues to recur, the patient is aware and understands that surgery may still be indicated/required.  All patient questions were answered, to the best of my ability, and the patient agrees to the aforementioned plan of action with follow-up as indicated.   The risks and benefits of endoscopic evaluation/treatment were discussed with the patient and/or family; these include but are not limited to the risk of perforation, infection, bleeding, missed lesions, lack of diagnosis, severe illness requiring hospitalization, as well as anesthesia and sedation related illnesses.  The patient's history has been reviewed, patient examined, no change in status, and deemed stable for procedure.  The patient and/or family is agreeable to proceed.    [name], MD [facility] Advanced Endoscopy Office # [phone]

## 2023-03-04 LAB — SURGICAL PATHOLOGY

## 2023-03-05 ENCOUNTER — Encounter (HOSPITAL_COMMUNITY): Payer: Self-pay | Admitting: Gastroenterology

## 2023-03-06 ENCOUNTER — Encounter: Payer: Self-pay | Admitting: Gastroenterology

## 2023-03-12 ENCOUNTER — Other Ambulatory Visit: Payer: Self-pay | Admitting: Physician Assistant

## 2023-04-14 ENCOUNTER — Ambulatory Visit: Payer: Medicare HMO | Admitting: Podiatry

## 2023-04-28 ENCOUNTER — Other Ambulatory Visit: Payer: Self-pay | Admitting: Allergy & Immunology

## 2023-05-21 ENCOUNTER — Other Ambulatory Visit: Payer: Self-pay | Admitting: *Deleted

## 2023-05-21 NOTE — Telephone Encounter (Signed)
 error

## 2023-06-15 ENCOUNTER — Other Ambulatory Visit: Payer: Self-pay | Admitting: Physician Assistant

## 2023-07-14 ENCOUNTER — Other Ambulatory Visit: Payer: Self-pay

## 2023-07-14 ENCOUNTER — Ambulatory Visit (INDEPENDENT_AMBULATORY_CARE_PROVIDER_SITE_OTHER): Payer: Medicare HMO | Admitting: Allergy & Immunology

## 2023-07-14 ENCOUNTER — Encounter: Payer: Self-pay | Admitting: Allergy & Immunology

## 2023-07-14 VITALS — BP 138/88 | HR 75 | Temp 98.9°F | Resp 16 | Ht 66.0 in | Wt 241.6 lb

## 2023-07-14 DIAGNOSIS — K219 Gastro-esophageal reflux disease without esophagitis: Secondary | ICD-10-CM

## 2023-07-14 DIAGNOSIS — J3089 Other allergic rhinitis: Secondary | ICD-10-CM

## 2023-07-14 DIAGNOSIS — L2084 Intrinsic (allergic) eczema: Secondary | ICD-10-CM

## 2023-07-14 DIAGNOSIS — J4540 Moderate persistent asthma, uncomplicated: Secondary | ICD-10-CM | POA: Diagnosis not present

## 2023-07-14 DIAGNOSIS — J302 Other seasonal allergic rhinitis: Secondary | ICD-10-CM | POA: Diagnosis not present

## 2023-07-14 MED ORDER — MONTELUKAST SODIUM 10 MG PO TABS: 10.0000 mg | ORAL_TABLET | Freq: Every day | ORAL | 5 refills | Status: DC

## 2023-07-14 MED ORDER — ALBUTEROL SULFATE HFA 108 (90 BASE) MCG/ACT IN AERS: 2.0000 | INHALATION_SPRAY | RESPIRATORY_TRACT | 1 refills | Status: AC | PRN

## 2023-07-14 MED ORDER — OMEPRAZOLE 20 MG PO CPDR: 20.0000 mg | DELAYED_RELEASE_CAPSULE | Freq: Every day | ORAL | 5 refills | Status: DC

## 2023-07-14 MED ORDER — TRIAMCINOLONE ACETONIDE 0.1 % EX CREA: TOPICAL_CREAM | Freq: Two times a day (BID) | CUTANEOUS | 5 refills | Status: AC

## 2023-07-14 MED ORDER — TRELEGY ELLIPTA 200-62.5-25 MCG/ACT IN AEPB: 1.0000 | INHALATION_SPRAY | Freq: Every day | RESPIRATORY_TRACT | 5 refills | Status: AC

## 2023-07-14 NOTE — Progress Notes (Signed)
 FOLLOW UP  Date of Service/Encounter:  07/14/23   Assessment:   Moderate persistent asthma, uncomplicated - with eosinophilic phenotype   Seasonal and perennial allergic rhinitis (ragweed, mixed feath, mouse, trees, weeds, grasses, indoor molds, outdoor molds, dust mites, cat, dog and cockroach)   Intrinsic atopic dermatitis   Plan/Recommendations:   1. Moderate persistent asthma, uncomplicated - Lung testing looked excellent today!  - I think that you are doing very well with the asthma.  - We will continue with the Trelegy.    - Daily controller medication(s): Trelegy one puff once daily - Prior to physical activity: ProAir 2 puffs 10-15 minutes before physical activity. - Rescue medications: ProAir 4 puffs every 4-6 hours as needed - Asthma control goals:  * Full participation in all desired activities (may need albuterol before activity) * Albuterol use two time or less a week on average (not counting use with activity) * Cough interfering with sleep two time or less a month * Oral steroids no more than once a year * No hospitalizations  2. Seasonal and perennial allergic rhinitis (ragweed, mixed feath, mouse, trees, weeds, grasses, indoor molds, outdoor molds, dust mites, cat, dog and cockroach)  - Continue with these medications EVERY DAY: Zyrtec (cetirizine) 10mg  tablet once daily and Singulair (montelukast) 10mg  at night - Continue with these medications AS NEEDED: Flonase (fluticasone) two sprays per nostril daily and Astelin (azelastine) 2 sprays per nostril 1-2 times daily as needed  3. Intrinsic atopic dermatitis - Continue with triamcinolone 0.1% ointment twice daily as needed to the worst areas.  - Continue with moisturizing twice daily.  4. Return in about 6 months (around 01/11/2024). You can have the follow up appointment with Dr. Dellis Anes or a Nurse Practicioner (our Nurse Practitioners are excellent and always have Physician oversight!).     Subjective:   Nicholas Burns is a 73 y.o. male presenting today for follow up of  Chief Complaint  Patient presents with   Asthma    Nicholas Burns has a history of the following: Patient Active Problem List   Diagnosis Date Noted   Adenomatous polyp of transverse colon 03/03/2023   Hx of adenomatous colonic polyps 03/03/2023   Dementia without behavioral disturbance, psychotic disturbance, mood disturbance, or anxiety (HCC) 06/13/2021   Pes planus 03/15/2021   Seasonal allergic conjunctivitis 02/18/2021   Gastroesophageal reflux disease 02/18/2021   Intrinsic atopic dermatitis 10/22/2018   Seasonal and perennial allergic rhinitis 07/21/2018   Not well controlled moderate persistent asthma 03/18/2017   Morbid obesity due to excess calories (HCC)  complicated by dm/ hbp, hyperlipidemia 03/18/2017   SOB (shortness of breath) 01/26/2017   Essential hypertension 09/13/2015   Asthma 09/30/2013   Poor dentition 09/30/2013    History obtained from: chart review and patient.  Discussed the use of AI scribe software for clinical note transcription with the patient and/or guardian, who gave verbal consent to proceed.  Nicholas Burns is a 73 y.o. male presenting for a follow up visit.  He was last seen in November 2023.  At that time, lung testing looks stable. We continued with Trelegy one puff once daily as well as albuterol as needed.  For her seasonal and perennial allergic rhinitis, we continued with Zyrtec and Singulair as well as Flonase and Astelin.  For the atopic dermatitis, we continue with triamcinolone twice daily as needed.  Since last visit, he has largely done well.  Asthma/Respiratory Symptom History: He has been experiencing a persistent and severe cough for  two weeks following a flu infection. He did not take Tamiflu and managed the flu symptoms at home. No fever, chest pain, or difficulty breathing out. No recent hospitalizations. He is currently using Trelegy for asthma  management and adheres to the medication regimen without missing doses. He previously used [place] but has since discontinued it. There are no reports of exacerbations or significant respiratory distress.  Allergic Rhinitis Symptom History: He takes Zyrtec and Singulair for allergies, which provide adequate control of symptoms. He does not require additional nasal sprays at this time.  He has not been on antibiotics for any sinus infections.  Overall, this combination of symptoms seems to be working well.  Skin Symptom History: Skin is under good control with as needed triamcinolone.  He does not think he needs a refill.   He is concerned about shingles but has not received the shingles vaccine yet.  He went to know how he felt about it.  His family is doing well, although they also had the flu. He is concerned about a family member, Nicholas Burns, who smokes outside in cold weather. He has never smoked.   Otherwise, there have been no changes to his past medical history, surgical history, family history, or social history.    Review of systems otherwise negative other than that mentioned in the HPI.    Objective:   Blood pressure 138/88, pulse 75, temperature 98.9 F (37.2 C), temperature source Temporal, resp. rate 16, height 5\' 6"  (1.676 m), weight 241 lb 9.6 oz (109.6 kg), SpO2 95%. Body mass index is 39 kg/m.    Physical Exam Vitals reviewed.  Constitutional:      Appearance: He is well-developed.     Comments: Much more responsive today.  Interactive.  HENT:     Head: Normocephalic and atraumatic.     Right Ear: Tympanic membrane, ear canal and external ear normal.     Left Ear: Tympanic membrane, ear canal and external ear normal.     Nose: Mucosal edema and rhinorrhea present. No nasal deformity or septal deviation.     Right Turbinates: Enlarged, swollen and pale.     Left Turbinates: Enlarged, swollen and pale.     Right Sinus: No maxillary sinus tenderness or frontal sinus  tenderness.     Left Sinus: No maxillary sinus tenderness or frontal sinus tenderness.     Comments: No nasal polyps.    Mouth/Throat:     Mouth: Mucous membranes are not pale and not dry.     Pharynx: Uvula midline.  Eyes:     General:        Right eye: No discharge.        Left eye: No discharge.     Conjunctiva/sclera: Conjunctivae normal.     Right eye: Right conjunctiva is not injected. No chemosis.    Left eye: Left conjunctiva is not injected. No chemosis.    Pupils: Pupils are equal, round, and reactive to light.  Cardiovascular:     Rate and Rhythm: Normal rate and regular rhythm.     Heart sounds: Normal heart sounds.  Pulmonary:     Effort: Pulmonary effort is normal. No tachypnea, accessory muscle usage or respiratory distress.     Breath sounds: Normal breath sounds. No wheezing, rhonchi or rales.     Comments: Decreased air movement at the bases. No crackles or wheezes noted.  Chest:     Chest wall: No tenderness.  Lymphadenopathy:     Cervical: No cervical adenopathy.  Skin:    General: Skin is warm.     Capillary Refill: Capillary refill takes less than 2 seconds.     Coloration: Skin is not pale.     Findings: No abrasion, erythema, petechiae or rash. Rash is not papular, urticarial or vesicular.     Comments: No eczematous or urticarial lesions noted.   Neurological:     Mental Status: He is alert.  Psychiatric:        Behavior: Behavior is cooperative.      Diagnostic studies:    Spirometry: results normal (FEV1: 1.91/74%, FVC: 2.79/82%, FEV1/FVC: 68%).    Spirometry consistent with normal pattern.   Allergy Studies: none       Malachi Bonds, MD  [facility]

## 2023-07-14 NOTE — Patient Instructions (Addendum)
 1. Moderate persistent asthma, uncomplicated - Lung testing looked excellent today!  - I think that you are doing very well with the asthma.  - We will continue with the Trelegy.    - Daily controller medication(s): Trelegy one puff once daily - Prior to physical activity: ProAir 2 puffs 10-15 minutes before physical activity. - Rescue medications: ProAir 4 puffs every 4-6 hours as needed - Asthma control goals:  * Full participation in all desired activities (may need albuterol before activity) * Albuterol use two time or less a week on average (not counting use with activity) * Cough interfering with sleep two time or less a month * Oral steroids no more than once a year * No hospitalizations  2. Seasonal and perennial allergic rhinitis (ragweed, mixed feath, mouse, trees, weeds, grasses, indoor molds, outdoor molds, dust mites, cat, dog and cockroach)  - Continue with these medications EVERY DAY: Zyrtec (cetirizine) 10mg  tablet once daily and Singulair (montelukast) 10mg  at night - Continue with these medications AS NEEDED: Flonase (fluticasone) two sprays per nostril daily and Astelin (azelastine) 2 sprays per nostril 1-2 times daily as needed  3. Intrinsic atopic dermatitis - Continue with triamcinolone 0.1% ointment twice daily as needed to the worst areas.  - Continue with moisturizing twice daily.  4. Return in about 6 months (around 01/11/2024). You can have the follow up appointment with Dr. Dellis Anes or a Nurse Practicioner (our Nurse Practitioners are excellent and always have Physician oversight!).    Please inform us of any Emergency Department visits, hospitalizations, or changes in symptoms. Call us before going to the ED for breathing or allergy symptoms since we might be able to fit you in for a sick visit. Feel free to contact us anytime with any questions, problems, or concerns.  It was a pleasure to see you again today!  Websites that have reliable patient  information: 1. American Academy of Asthma, Allergy, and Immunology: www.aaaai.org 2. Food Allergy Research and Education (FARE): foodallergy.org 3. Mothers of Asthmatics: http://www.asthmacommunitynetwork.org 4. American College of Allergy, Asthma, and Immunology: www.acaai.org      "Like" [place] on Facebook and Instagram for our latest updates!      A healthy democracy works best when [employer] participate! Make sure you are registered to vote! If you have moved or changed any of your contact information, you will need to get this updated before voting! Scan the QR codes below to learn more!

## 2023-07-24 ENCOUNTER — Encounter: Payer: Self-pay | Admitting: Podiatry

## 2023-07-24 ENCOUNTER — Ambulatory Visit: Payer: Medicare HMO | Admitting: Podiatry

## 2023-07-24 DIAGNOSIS — M79675 Pain in left toe(s): Secondary | ICD-10-CM

## 2023-07-24 DIAGNOSIS — M79674 Pain in right toe(s): Secondary | ICD-10-CM

## 2023-07-24 DIAGNOSIS — E1149 Type 2 diabetes mellitus with other diabetic neurological complication: Secondary | ICD-10-CM | POA: Diagnosis not present

## 2023-07-24 DIAGNOSIS — E1140 Type 2 diabetes mellitus with diabetic neuropathy, unspecified: Secondary | ICD-10-CM | POA: Diagnosis not present

## 2023-07-24 DIAGNOSIS — B351 Tinea unguium: Secondary | ICD-10-CM

## 2023-07-24 NOTE — Progress Notes (Signed)
This patient presents to the office with chief complaint of long thick painful nails.  Patient says the nails are painful walking and wearing shoes.  This patient is unable to self treat.  This patient is unable to trim his nails since she is unable to reach his nails. Patient says he is experiencing diabetic neuropathy symptoms.   He presents to the office for preventative foot care services.  General Appearance  Alert, conversant and in no acute stress.  Vascular  Dorsalis pedis and posterior tibial  pulses are palpable  bilaterally.  Capillary return is within normal limits  bilaterally. Temperature is within normal limits  bilaterally.  Neurologic  Senn-Weinstein monofilament wire test within normal limits  bilaterally. Muscle power within normal limits bilaterally.  Nails Thick disfigured discolored nails with subungual debris  from hallux to fifth toes bilaterally. No evidence of bacterial infection or drainage bilaterally.  Orthopedic  No limitations of motion  feet .  No crepitus or effusions noted.  Hammer toes  B/L.   Pes planus.  Skin  normotropic skin with no porokeratosis noted bilaterally.  No signs of infections or ulcers noted.     Onychomycosis  Nails  B/L.  Pain in right toes  Pain in left toes    Debridement of nails both feet  with nail nipper followed trimming the nails with dremel tool.     RTC 3 months.   Gregory Mayer DPM  

## 2023-10-26 ENCOUNTER — Encounter: Payer: Self-pay | Admitting: Podiatry

## 2023-10-26 ENCOUNTER — Ambulatory Visit (INDEPENDENT_AMBULATORY_CARE_PROVIDER_SITE_OTHER): Admitting: Podiatry

## 2023-10-26 DIAGNOSIS — E1140 Type 2 diabetes mellitus with diabetic neuropathy, unspecified: Secondary | ICD-10-CM

## 2023-10-26 DIAGNOSIS — E1149 Type 2 diabetes mellitus with other diabetic neurological complication: Secondary | ICD-10-CM | POA: Diagnosis not present

## 2023-10-26 DIAGNOSIS — M79675 Pain in left toe(s): Secondary | ICD-10-CM

## 2023-10-26 DIAGNOSIS — B351 Tinea unguium: Secondary | ICD-10-CM | POA: Diagnosis not present

## 2023-10-26 DIAGNOSIS — M79674 Pain in right toe(s): Secondary | ICD-10-CM

## 2023-10-26 NOTE — Progress Notes (Signed)
This patient presents to the office with chief complaint of long thick painful nails.  Patient says the nails are painful walking and wearing shoes.  This patient is unable to self treat.  This patient is unable to trim his nails since she is unable to reach his nails. Patient says he is experiencing diabetic neuropathy symptoms.   He presents to the office for preventative foot care services.  General Appearance  Alert, conversant and in no acute stress.  Vascular  Dorsalis pedis and posterior tibial  pulses are palpable  bilaterally.  Capillary return is within normal limits  bilaterally. Temperature is within normal limits  bilaterally.  Neurologic  Senn-Weinstein monofilament wire test within normal limits  bilaterally. Muscle power within normal limits bilaterally.  Nails Thick disfigured discolored nails with subungual debris  from hallux to fifth toes bilaterally. No evidence of bacterial infection or drainage bilaterally.  Orthopedic  No limitations of motion  feet .  No crepitus or effusions noted.  Hammer toes  B/L.   Pes planus.  Skin  normotropic skin with no porokeratosis noted bilaterally.  No signs of infections or ulcers noted.     Onychomycosis  Nails  B/L.  Pain in right toes  Pain in left toes    Debridement of nails both feet  with nail nipper followed trimming the nails with dremel tool.     RTC 3 months.   Gregory Mayer DPM  

## 2024-01-04 ENCOUNTER — Other Ambulatory Visit: Payer: Self-pay | Admitting: Allergy & Immunology

## 2024-01-25 ENCOUNTER — Ambulatory Visit: Admitting: Podiatry
